# Patient Record
Sex: Female | Born: 1935 | Race: White | Hispanic: No | Smoking: Never smoker
Health system: Southern US, Community
[De-identification: ages and names within clinical notes are randomized; demographics above are authoritative.]

## PROBLEM LIST (undated history)

## (undated) DIAGNOSIS — B029 Zoster without complications: Secondary | ICD-10-CM

## (undated) DIAGNOSIS — I4891 Unspecified atrial fibrillation: Secondary | ICD-10-CM

## (undated) DIAGNOSIS — T7840XA Allergy, unspecified, initial encounter: Secondary | ICD-10-CM

## (undated) DIAGNOSIS — H3530 Unspecified macular degeneration: Secondary | ICD-10-CM

## (undated) DIAGNOSIS — K222 Esophageal obstruction: Secondary | ICD-10-CM

## (undated) DIAGNOSIS — K743 Primary biliary cirrhosis: Secondary | ICD-10-CM

## (undated) DIAGNOSIS — R7989 Other specified abnormal findings of blood chemistry: Secondary | ICD-10-CM

## (undated) DIAGNOSIS — Z86718 Personal history of other venous thrombosis and embolism: Secondary | ICD-10-CM

## (undated) DIAGNOSIS — K219 Gastro-esophageal reflux disease without esophagitis: Secondary | ICD-10-CM

## (undated) DIAGNOSIS — M810 Age-related osteoporosis without current pathological fracture: Secondary | ICD-10-CM

## (undated) DIAGNOSIS — Z7901 Long term (current) use of anticoagulants: Secondary | ICD-10-CM

## (undated) DIAGNOSIS — H269 Unspecified cataract: Secondary | ICD-10-CM

## (undated) DIAGNOSIS — R1310 Dysphagia, unspecified: Secondary | ICD-10-CM

## (undated) DIAGNOSIS — I2510 Atherosclerotic heart disease of native coronary artery without angina pectoris: Secondary | ICD-10-CM

## (undated) DIAGNOSIS — Z8611 Personal history of tuberculosis: Secondary | ICD-10-CM

## (undated) DIAGNOSIS — E785 Hyperlipidemia, unspecified: Secondary | ICD-10-CM

## (undated) DIAGNOSIS — E119 Type 2 diabetes mellitus without complications: Secondary | ICD-10-CM

## (undated) DIAGNOSIS — M1990 Unspecified osteoarthritis, unspecified site: Secondary | ICD-10-CM

## (undated) DIAGNOSIS — N189 Chronic kidney disease, unspecified: Secondary | ICD-10-CM

## (undated) DIAGNOSIS — R945 Abnormal results of liver function studies: Secondary | ICD-10-CM

## (undated) DIAGNOSIS — H409 Unspecified glaucoma: Secondary | ICD-10-CM

## (undated) HISTORY — DX: Esophageal obstruction: K22.2

## (undated) HISTORY — DX: Gastro-esophageal reflux disease without esophagitis: K21.9

## (undated) HISTORY — PX: ABDOMINAL HYSTERECTOMY: SHX81

## (undated) HISTORY — PX: HERNIA REPAIR: SHX51

## (undated) HISTORY — DX: Unspecified atrial fibrillation: I48.91

## (undated) HISTORY — PX: CHOLECYSTECTOMY: SHX55

## (undated) HISTORY — PX: OTHER SURGICAL HISTORY: SHX169

## (undated) HISTORY — DX: Atherosclerotic heart disease of native coronary artery without angina pectoris: I25.10

## (undated) HISTORY — DX: Age-related osteoporosis without current pathological fracture: M81.0

## (undated) HISTORY — DX: Unspecified cataract: H26.9

## (undated) HISTORY — DX: Unspecified macular degeneration: H35.30

## (undated) HISTORY — DX: Dysphagia, unspecified: R13.10

## (undated) HISTORY — DX: Type 2 diabetes mellitus without complications: E11.9

## (undated) HISTORY — PX: MINOR HEMORRHOIDECTOMY: SHX6238

## (undated) HISTORY — DX: Chronic kidney disease, unspecified: N18.9

## (undated) HISTORY — DX: Unspecified osteoarthritis, unspecified site: M19.90

## (undated) HISTORY — DX: Long term (current) use of anticoagulants: Z79.01

## (undated) HISTORY — DX: Abnormal results of liver function studies: R94.5

## (undated) HISTORY — DX: Personal history of other venous thrombosis and embolism: Z86.718

## (undated) HISTORY — DX: Zoster without complications: B02.9

## (undated) HISTORY — DX: Allergy, unspecified, initial encounter: T78.40XA

## (undated) HISTORY — DX: Other specified abnormal findings of blood chemistry: R79.89

## (undated) HISTORY — PX: LIVER BIOPSY: SHX301

## (undated) HISTORY — DX: Primary biliary cirrhosis: K74.3

## (undated) HISTORY — DX: Unspecified glaucoma: H40.9

## (undated) HISTORY — DX: Personal history of tuberculosis: Z86.11

## (undated) HISTORY — DX: Hyperlipidemia, unspecified: E78.5

---

## 1999-07-24 ENCOUNTER — Encounter: Payer: Self-pay | Admitting: Thoracic Surgery

## 1999-07-25 ENCOUNTER — Encounter: Payer: Self-pay | Admitting: Thoracic Surgery

## 1999-07-25 ENCOUNTER — Encounter (INDEPENDENT_AMBULATORY_CARE_PROVIDER_SITE_OTHER): Payer: Self-pay | Admitting: *Deleted

## 1999-07-25 ENCOUNTER — Inpatient Hospital Stay (HOSPITAL_COMMUNITY): Admission: RE | Admit: 1999-07-25 | Discharge: 1999-07-30 | Payer: Self-pay | Admitting: Thoracic Surgery

## 1999-07-26 ENCOUNTER — Encounter: Payer: Self-pay | Admitting: Thoracic Surgery

## 1999-07-27 ENCOUNTER — Encounter: Payer: Self-pay | Admitting: Thoracic Surgery

## 1999-07-28 ENCOUNTER — Encounter: Payer: Self-pay | Admitting: Thoracic Surgery

## 1999-07-29 ENCOUNTER — Encounter: Payer: Self-pay | Admitting: Thoracic Surgery

## 1999-07-30 ENCOUNTER — Encounter: Payer: Self-pay | Admitting: Thoracic Surgery

## 1999-08-12 ENCOUNTER — Encounter: Admission: RE | Admit: 1999-08-12 | Discharge: 1999-08-12 | Payer: Self-pay | Admitting: Thoracic Surgery

## 1999-08-12 ENCOUNTER — Encounter: Payer: Self-pay | Admitting: Thoracic Surgery

## 1999-09-17 ENCOUNTER — Encounter: Payer: Self-pay | Admitting: Thoracic Surgery

## 1999-09-17 ENCOUNTER — Encounter: Admission: RE | Admit: 1999-09-17 | Discharge: 1999-09-17 | Payer: Self-pay | Admitting: Thoracic Surgery

## 2005-12-30 ENCOUNTER — Ambulatory Visit: Payer: Self-pay | Admitting: Cardiology

## 2006-01-13 ENCOUNTER — Ambulatory Visit: Payer: Self-pay | Admitting: Cardiology

## 2006-03-18 ENCOUNTER — Ambulatory Visit: Payer: Self-pay | Admitting: Cardiology

## 2006-03-30 ENCOUNTER — Ambulatory Visit: Payer: Self-pay | Admitting: Cardiology

## 2007-05-23 ENCOUNTER — Ambulatory Visit: Payer: Self-pay | Admitting: Gastroenterology

## 2007-05-23 LAB — CONVERTED CEMR LAB
ALT: 102 units/L — ABNORMAL HIGH (ref 0–35)
Aldolase: 11.8 units/L — ABNORMAL HIGH (ref <=8.1)
Basophils Absolute: 0 10*3/uL (ref 0.0–0.1)
Bilirubin, Direct: 0.1 mg/dL (ref 0.0–0.3)
CO2: 31 meq/L (ref 19–32)
CRP, High Sensitivity: 5 (ref 0.00–5.00)
Calcium: 8.9 mg/dL (ref 8.4–10.5)
Hemoglobin: 13 g/dL (ref 12.0–15.0)
Lymphocytes Relative: 26.9 % (ref 12.0–46.0)
MCHC: 33.6 g/dL (ref 30.0–36.0)
Neutro Abs: 3.9 10*3/uL (ref 1.4–7.7)
Neutrophils Relative %: 63.2 % (ref 43.0–77.0)
Platelets: 183 10*3/uL (ref 150–400)
Prothrombin Time: 22.7 s — ABNORMAL HIGH (ref 10.9–13.3)
RDW: 13 % (ref 11.5–14.6)
Sodium: 141 meq/L (ref 135–145)
Total Bilirubin: 0.7 mg/dL (ref 0.3–1.2)
aPTT: 44 s — ABNORMAL HIGH (ref 21.7–29.8)

## 2007-05-25 ENCOUNTER — Ambulatory Visit: Payer: Self-pay | Admitting: Cardiology

## 2007-06-06 ENCOUNTER — Telehealth: Payer: Self-pay | Admitting: Gastroenterology

## 2007-06-07 ENCOUNTER — Ambulatory Visit (HOSPITAL_COMMUNITY): Admission: RE | Admit: 2007-06-07 | Discharge: 2007-06-07 | Payer: Self-pay | Admitting: Gastroenterology

## 2007-06-07 ENCOUNTER — Telehealth (INDEPENDENT_AMBULATORY_CARE_PROVIDER_SITE_OTHER): Payer: Self-pay | Admitting: *Deleted

## 2007-06-07 ENCOUNTER — Encounter: Payer: Self-pay | Admitting: Gastroenterology

## 2007-06-13 ENCOUNTER — Ambulatory Visit (HOSPITAL_COMMUNITY): Admission: RE | Admit: 2007-06-13 | Discharge: 2007-06-13 | Payer: Self-pay | Admitting: Gastroenterology

## 2007-08-16 ENCOUNTER — Ambulatory Visit: Payer: Self-pay | Admitting: Gastroenterology

## 2007-08-16 DIAGNOSIS — R945 Abnormal results of liver function studies: Secondary | ICD-10-CM | POA: Insufficient documentation

## 2007-08-16 DIAGNOSIS — D68318 Other hemorrhagic disorder due to intrinsic circulating anticoagulants, antibodies, or inhibitors: Secondary | ICD-10-CM | POA: Insufficient documentation

## 2007-08-23 ENCOUNTER — Encounter: Payer: Self-pay | Admitting: Gastroenterology

## 2007-08-23 ENCOUNTER — Inpatient Hospital Stay (HOSPITAL_COMMUNITY): Admission: AD | Admit: 2007-08-23 | Discharge: 2007-08-27 | Payer: Self-pay | Admitting: Gastroenterology

## 2007-08-23 ENCOUNTER — Ambulatory Visit: Payer: Self-pay | Admitting: Internal Medicine

## 2007-09-20 ENCOUNTER — Ambulatory Visit: Payer: Self-pay | Admitting: Gastroenterology

## 2007-09-22 ENCOUNTER — Telehealth: Payer: Self-pay | Admitting: Gastroenterology

## 2007-10-31 ENCOUNTER — Ambulatory Visit: Payer: Self-pay | Admitting: Gastroenterology

## 2007-10-31 LAB — CONVERTED CEMR LAB: Total Bilirubin: 0.7 mg/dL (ref 0.3–1.2)

## 2007-11-01 ENCOUNTER — Telehealth: Payer: Self-pay | Admitting: Gastroenterology

## 2007-11-02 ENCOUNTER — Ambulatory Visit: Payer: Self-pay | Admitting: Gastroenterology

## 2007-12-12 ENCOUNTER — Ambulatory Visit: Payer: Self-pay | Admitting: Gastroenterology

## 2007-12-12 LAB — CONVERTED CEMR LAB
ALT: 17 units/L (ref 0–35)
AST: 26 units/L (ref 0–37)
Alkaline Phosphatase: 102 units/L (ref 39–117)
Bilirubin, Direct: 0.1 mg/dL (ref 0.0–0.3)
Total Bilirubin: 0.7 mg/dL (ref 0.3–1.2)

## 2008-01-09 ENCOUNTER — Ambulatory Visit: Payer: Self-pay | Admitting: Gastroenterology

## 2008-01-09 DIAGNOSIS — K745 Biliary cirrhosis, unspecified: Secondary | ICD-10-CM

## 2008-06-12 ENCOUNTER — Encounter (INDEPENDENT_AMBULATORY_CARE_PROVIDER_SITE_OTHER): Payer: Self-pay | Admitting: *Deleted

## 2008-06-25 ENCOUNTER — Telehealth: Payer: Self-pay | Admitting: Gastroenterology

## 2008-06-29 ENCOUNTER — Ambulatory Visit: Payer: Self-pay | Admitting: Gastroenterology

## 2008-06-29 LAB — CONVERTED CEMR LAB
ALT: 14 units/L (ref 0–35)
Total Protein: 7.1 g/dL (ref 6.0–8.3)

## 2009-05-29 ENCOUNTER — Ambulatory Visit: Payer: Self-pay | Admitting: Gastroenterology

## 2009-05-29 LAB — CONVERTED CEMR LAB
Bilirubin, Direct: 0.1 mg/dL (ref 0.0–0.3)
Total Bilirubin: 0.5 mg/dL (ref 0.3–1.2)

## 2010-03-11 NOTE — Assessment & Plan Note (Signed)
Summary: yearly follow up appt/lk   History of Present Illness Visit Type: Follow-up Visit Primary GI MD: Melvia Heaps MD [facility] Primary Provider: Dorothyann Peng. Bevelyn Buckles, NP Requesting Provider: n/a Chief Complaint: Yearly f/u for abnormal liver function test. Pt denies any GI complaints  History of Present Illness:   Stacey Brown has returned for followup of her primary biliary cirrhosis.  She is antimitochondrial antibody positive.  She takes Delma Freeze but ran out of her medications.  She denies abdominal pain, pruritus or jaundice.   GI Review of Systems      Denies abdominal pain, acid reflux, belching, bloating, chest pain, dysphagia with liquids, dysphagia with solids, heartburn, loss of appetite, nausea, vomiting, vomiting blood, weight loss, and  weight gain.        Denies anal fissure, black tarry stools, change in bowel habit, constipation, diarrhea, diverticulosis, fecal incontinence, heme positive stool, hemorrhoids, irritable bowel syndrome, jaundice, light color stool, liver problems, rectal bleeding, and  rectal pain.    Current Medications (verified): 1)  Warfarin Sodium 3 Mg  Tabs (Warfarin Sodium) .... Take As Directed. 2)  Glimepiride 2 Mg  Tabs (Glimepiride) .... Take 1 Tablet By Mouth Once A Day 3)  Fexofenadine Hcl 180 Mg  Tabs (Fexofenadine Hcl) .... Take 1 Tablet By Mouth Once A Day 4)  Amlodipine Besylate 10 Mg  Tabs (Amlodipine Besylate) .... Take 1 Tablet By Mouth Once A Day 5)  Furosemide 20 Mg  Tabs (Furosemide) .... Take 1 Tablet By Mouth Once A Day 6)  Pantoprazole Sodium 40 Mg Tbec (Pantoprazole Sodium) .Marland Kitchen.. 1 By Mouth Once Daily  Allergies (verified): No Known Drug Allergies  Past History:  Past Medical History: Reviewed history from 08/16/2007 and no changes required. abnormal liver function test Arthritis Atrial fibrillation.   Past Surgical History: Reviewed history from 11/02/2007 and no changes required. Hysterectomy Hemorrhoidectomy Left Breast  biopsy Cholecystectomy Herniography Right Lung biopsy Right hip replacement liver biopsy  Family History: Family History of Breast Cancer: Neice Family History of Liver Disease/Cirrhosis: Nephew Family History of Heart Disease: Brother, Mother Family History of Diabetes: Brother, Mother, Orlie Dakin, Aunts, Uncles, cousins Family History of Kidney Disease: Cousin No FH of Colon Cancer:  Social History: Reviewed history from 09/20/2007 and no changes required. Occupation: Retired/Disabled Patient has never smoked.  Alcohol Use - no Illicit Drug Use - no Patient gets regular exercise. Daily Caffeine Use  Review of Systems       The patient complains of arthritis/joint pain.  The patient denies allergy/sinus, anemia, anxiety-new, back pain, blood in urine, breast changes/lumps, change in vision, confusion, cough, coughing up blood, depression-new, fainting, fatigue, fever, headaches-new, hearing problems, heart murmur, heart rhythm changes, itching, menstrual pain, muscle pains/cramps, night sweats, nosebleeds, pregnancy symptoms, shortness of breath, skin rash, sleeping problems, sore throat, swelling of feet/legs, swollen lymph glands, thirst - excessive , urination - excessive , urination changes/pain, urine leakage, vision changes, and voice change.         All other systems were reviewed and were negative   Vital Signs:  Patient profile:   77 year old female Height:      64 inches Weight:      119 pounds BSA:     1.57 Pulse rate:   64 / minute Pulse rhythm:   regular BP sitting:   118 / 60  (left arm) Cuff size:   regular  Vitals Entered By: Ok Anis CMA (May 29, 2009 10:27 AM)  Physical Exam  Additional Exam:  On  physical exam she is an elderly female  skin: anicteric HEENT: normocephalic; PEERLA; no nasal or pharyngeal abnormalities neck: supple nodes: no cervical lymphadenopathy chest: clear to ausculatation and percussion heart: no murmurs, gallops, or  rubs abd: soft, nontender; BS normoactive; no abdominal masses, tenderness, organomegaly rectal: deferred ext: no cynanosis, clubbing, edema skeletal: no deformities neuro: oriented x 3; no focal abnormalities    Impression & Recommendations:  Problem # 1:  PBC (ICD-571.6) Assessment Unchanged Plan to resume her urso  Problem # 2:  COAGULOPATHY, COUMADIN-INDUCED (ICD-286.5) Assessment: Comment Only  Other Orders: TLB-Hepatic/Liver Function Pnl (80076-HEPATIC)  Patient Instructions: 1)  Copy sent to : Kingsley Spittle 2)  You will go to the basement today for labs 3)  The medication list was reviewed and reconciled.  All changed / newly prescribed medications were explained.  A complete medication list was provided to the patient / caregiver. Prescriptions: URSODIOL 300 MG CAPS (URSODIOL) take one tab b.i.d.  #60 x 12   Entered and Authorized by:   Louis Meckel MD   Signed by:   Louis Meckel MD on 05/29/2009   Method used:   Electronically to        [facility] [phone]* (retail)       [address] [place], [place]  [address]       Ph: [phone] or [phone]       Fax: [phone]   RxID:   [id number]

## 2010-05-30 ENCOUNTER — Other Ambulatory Visit: Payer: Self-pay | Admitting: Gastroenterology

## 2010-05-30 NOTE — Telephone Encounter (Signed)
PT NEEDS TO CONTACT THE OFFICE TO MAKE HER A YEARLY OFFICE APPOINTMENT.Marland KitchenMarland Kitchen

## 2010-06-24 NOTE — Discharge Summary (Signed)
NAMEDIESHA, Stacey Brown                 ACCOUNT NO.:  [id number]   MEDICAL RECORD NO.:  [id number]          PATIENT TYPE:  INP   LOCATION:  1302                         FACILITY:  [facility]   PHYSICIAN:  Rachael Fee, MD   DATE OF BIRTH:  [birth year]   DATE OF ADMISSION:  08/23/2007  DATE OF DISCHARGE:  08/27/2007                               DISCHARGE SUMMARY   ADMITTING DIAGNOSES:  1. An 77 year old white female with pneumothorax sustained post liver      biopsy (pneumothorax of approximately 10%).  2. Abnormal liver function tests, chronic, rule out PBC.  3. Adult-onset diabetes mellitus.  4. Chronic Coumadin use secondary to history of atrial fibrillation.      The patient's Coumadin had been held pre liver biopsy.  5. Question of deep venous thrombosis, 2008.  6. Previous right hip fracture status post open reduction internal      fixation.  7. Remote history of tuberculosis.  8. Status post cholecystectomy, ventral umbilical hernia repair, left      breast biopsy hemorrhoidectomy.   DISCHARGE DIAGNOSIS:  Stable status post iatrogenic hydropneumothorax  occurring at time of liver biopsy. Clinically stable with persistent  small right apical pneumothorax.   BRIEF HISTORY:  Stacey Brown is an 77 year old white female known to Dr. Arlyce Dice  who has been followed for abnormal LFTs dating back several months.  She  was scheduled for a liver biopsy on the day of admission and this was  done per Dr. Arlyce Dice with ultrasound guidance. She did have successful  liver biopsy; however, she also developed transient hypoxia and a small  amount of hemoptysis, and subsequent chest x-ray showed a 5% to 12%  right apical pneumothorax.  Followup chest x-ray approximately  2 hours  later showed no change in the right apical pneumothorax, but she had a  new basilar component on the right of about 2.7 cm.  She was complaining  of discomfort in the right upper quadrant. O2 sats remained 95% on 2  liters, and she  was admitted for observation and medical management of  the pneumothorax.   CONSULTATIONS:  Pulmonary, Dr. Sherene Sires.   LABORATORY DATA:  On August 24, 2007 WBC of 6.4, hemoglobin 11.8,  hematocrit of 34.8, MCV of 90.8, platelets 185. On 07/16 WBC of 6.3,  hemoglobin 11, hematocrit of 32, platelets 146. On admission Protime  14.8, INR of 1.1, PTT of 32. On 07/17 Protime was 13.5, INR of 1.   X-ray studies: On admission, July 14, showed a right-sided pneumothorax,  apical 5% to 10%, and a new basilar component measuring 2.7 cm. Air  space consolidation in the right space as well.  Followup on July 15,  hydro pneumothorax in the right lung. Followup on July 16,  hydropneumothorax of the right lung, density in the right base  consistent with atelectasis and infiltrate unchanged, and on July 18  apical pneumothorax measuring 1.8 cm, unchanged bilateral pleural  effusions, no air space densities   HOSPITAL COURSE:  The patient had undergone outpatient ultrasound-guided  liver biopsy per Dr. Arlyce Dice and sustained a pneumothorax at  the time of  the procedure.  She remained clinically stable, did have a transient  hypoxia and a small amount of hemoptysis.  Chest x-ray was obtained  which did confirm a small right apical pneumothorax, and the patient was  admitted for medical management.  She was continued on O2.  Had a benign  hospital course. She did have some evidence of hydropneumothorax  consistent with bleed. She was seen in consultation by Dr. Sherene Sires for  pulmonary and felt that she should remain off of all anticoagulants and  to have followup chest x-ray in 48 hours, and it was felt that the  hydropneumothorax was very small, instead it should resolve without any  intervention.  The patient remained relatively asymptomatic. She was  allowed discharge to home on July 18 in a stable and improved condition  and was advised to hold her Coumadin until July 25 at which time this  would be restarted.  She was to continue her amlodipine, fexofenadine,  furosemide, glimepiride, and ranitidine as previous, and to follow up  with Dr. Arlyce Dice on August 11 or sooner if needed, and to call for  problems in the interim.  Path from the liver biopsy showed benign lung  tissue.      Mike Gip, PA-C      Rachael Fee, MD  Electronically Signed    AE/MEDQ  D:  09/15/2007  T:  09/15/2007  Job:  [id number]

## 2010-06-24 NOTE — Assessment & Plan Note (Signed)
[facility]                         GASTROENTEROLOGY OFFICE NOTE   Stacey Brown, Stacey Brown                        MRN:          [id number]  DATE:05/23/2007                            DOB:          1923-02-04    REASON FOR CONSULTATION:  Abnormal liver test.   HISTORY OF PRESENT ILLNESS:  Stacey Brown is an 77 year old white female  referred through the courtesy of Dr. Lorin Picket for evaluation.  Routine lab  tests have demonstrated abnormal liver tests.  On March 31, 2007, alk  phos was 231, LDH 296, AST 83, and ALT was 187.  Bilirubin was normal.  Repeat tests on April 14, 2007, were pertinent for alk phos of 234, AST  199, and ALT of 150.  Her aldolase was elevated to 20.1.  Hepatitis A,  B, and C antibodies were negative.  Stacey Brown has no specific GI  complaints except for mild fatigue.  Specifically, Stacey Brown is without  pruritus, pain, or weight loss.  Stacey Brown does have complaint of dysphagia to  solids and has occasional pyrosis.  An abdominal ultrasound demonstrated  a bile duct measuring 7-to-8-mm.  Stacey Brown is status post cholecystectomy.  Stacey Brown does not drink.  Stacey Brown has had no prior liver disease or jaundice.   PAST MEDICAL HISTORY:  1. Diabetes.  2. Hypertension.  3. Atrial fibrillation for which Stacey Brown is on Coumadin.  4. Arthritis.  5. DVT approximately a year ago.  6. Status post cholecystectomy.  7. Herniorrhaphy.  8. Hemorrhoidectomy.  9. Hysterectomy.  10.Lung biopsy.  11.Right hip replacement.   FAMILY HISTORY:  Pertinent for mother and brother with diabetes, and  brother mother with heart disease.   MEDICATIONS:  Glimepiride, fexofenadine, amlodipine, bisacodyl,  Coumadin, ranitidine, and furosemide.   ALLERGIES:  Stacey Brown has no allergies.   SOCIAL HISTORY:  Stacey Brown neither smokes nor drinks.  Stacey Brown is widowed and  retired.   REVIEW OF SYSTEMS:  Positive for feet swelling and joint pains,  otherwise negative.   PHYSICAL EXAMINATION:  GENERAL:  Stacey Brown is  an elderly female in no acute  distress.  VITAL SIGNS:  Pulse 76, blood pressure 140/70, weight 123.  HEENT: EOMI.  PERRLA.  Sclerae are anicteric.  Conjunctivae are pink.  NECK:  Supple without thyromegaly, adenopathy or carotid bruits.  CHEST:  Clear to auscultation and percussion without adventitious  sounds.  CARDIAC:  Regular rhythm; normal S1 S2.  There are no murmurs, gallops  or rubs.  ABDOMEN:  Bowel sounds are normoactive.  Abdomen is soft, nontender and  nondistended.  There are no abdominal masses, tenderness, splenic  enlargement or hepatomegaly.  EXTREMITIES:  Stacey Brown has 1 to 2 plus brawny edema of the ankles and feet.  SKELETAL:  Remarkable for no deformities.  NEUROLOGIC:  Stacey Brown is alert, oriented, and without focal abnormalities.  RECTAL:  Deferred.   IMPRESSION:  1. Abnormal liver function tests.  Stacey Brown has both a necroinflammatory      and cholestatic pattern.  I am concerned that Stacey Brown may have an      occult neoplasm causing biliary tract obstruction.  Her elevated  aldolase suggests that Stacey Brown may have an underlying myositis, though      this is not clinically evident.  Drug-induced hepatitis is unlikely      in the absence of new medications.  There is no evidence for viral      hepatitis.  2. Dysphagia, rule out esophageal stricture.  3. Atrial fibrillation on Coumadin.   RECOMMENDATION:  1. CT of the abdomen.  2. Repeat LFTs and check an aldolase and check AMA and ANA.  3. Consider upper endoscopy pending results of the above once her LFT      abnormalities are resolved.     Barbette Hair. Arlyce Dice, MD,FACG  Electronically Signed    RDK/MedQ  DD: 05/23/2007  DT: 05/23/2007  Job #: [id number]   cc:   Dr. Lorin Picket
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
## 2010-06-27 NOTE — Assessment & Plan Note (Signed)
[facility]                            [facility] OFF[facility] NOTE   Stacey Brown, Stacey Brown                        MRN:          [id number]  DATE:12/30/2005                            DOB:          06-01-1922    REFERRING PHYSICIAN:  Ernestina Penna, M.D.   HISTORY OF PRESENT ILLNESS:  The patient is an 77 year old female with  history of dyspnea, hypertension, diabetes mellitus.  The patient was last  seen in this office on December 29, 2002.  She was diagnosed with  hypertensive heart disease and aortic root dilatation.  The patient's aorta  measured 3.5 cm.  She has done well since that time.  It appears that her  blood pressures have been under reasonable control.  The patient has been  referred due to new onset of atrial fibrillation.   The patient states that she has occasional palpitations that are fairly  infrequent and only last a few seconds.  There is associated shortness of  breath, but no chest pain, orthopnea or PND.  She has noticed mild ankle  swelling.  Of note is that she had a Cardiolite stress study done 4 years  ago which was negative for ischemia.  Medications were switched from Norvasc  to Cardizem today in the office.  Her rate appears to be well controlled.  EKG demonstrates atrial flutter in the office today, but the EKG reviewed  from [facility] actually shows atrial fibrillation.   Of note is that the patient states that she is under the care of the eye  doctors in [place].  She apparently has a retinal bleed and has been  scheduled for surgery.  She also is planned to undergo cataract surgery.   MEDICATIONS:  1. Claritin.  2. Monopril 10 mg a day.  3. Amaryl.  4. Norvasc was discontinued.  5. Zantac.  6. Tylenol.  7. Aspirin 81 mg a day.  8. Cardizem LA 240 mg p.o. nightly.  9. Glimepiride 2 mg p.o. daily.   REVIEW OF SYMPTOMS:  Per HPI.  No melena or hematochezia.  No dysuria or  frequency.   No orthopnea or PND.  No syncope.  Decreased vision in the left  eye.  No other neurological symptoms.   PAST MEDICAL HISTORY:  See problem list below.   SOCIAL HISTORY:  Was reviewed.  The patient lives in [place].  She does not  smoke.   FAMILY HISTORY:  Noncontributory.   PHYSICAL EXAMINATION:  VITAL SIGNS:  Her blood pressure is 138/70.  Heart  rate is 72 beats per minute.  Weight is 130 pounds.  NECK:  Reveals normal carotid upstrokes.  No carotid bruits.  Supple.  HEENT:  Pupils, sclerae and conjunctivae clear.  Oropharynx is clear.  LUNGS:  Clear breath sounds.  HEART:  Irregular rate and rhythm.  Normal S1 and S2.  No pathological  murmurs.  ABDOMEN:  Soft and non-tender.  No rebound or guarding.  Good bowel sounds.  EXTREMITIES:  No cyanosis, clubbing or edema.   ELECTROCARDIOGRAM:  Atrial flutter with rate control.  Heart  rate 71 beats  per minute.   PROBLEM LIST:  1. Hypertensive heart disease.  2. New onset atrial flutter and atrial fibrillation, likely secondary to      number one.  3. History of mild aortic root dilatation.  4. History of hypertensive heart disease.  5. Adult onset diabetes mellitus.  6. Dyslipidemia.  7. Hypertension.   PLAN:  1. The patient has indications to start Coumadin given her age and risk      factors including hypertension, diabetes mellitus.  However, this      appears to be contraindicated given her current history of retinal      bleed and plan for surgery.  2. I have told the patient to have her eye taken care of first, and then      we will see her back in 3 months to reconsider the use of Coumadin.  3. Aspirin can be continued in the meanwhile.  4. The patient will be scheduled for CardioNet monitor, which will be      mailed to her home to assess rate control of atrial fibrillation.  At      this point we are choosing for a rate control strategy versus rhythm      control strategy.  5. The patient will need during the next  clinic visit, an      echocardiographic study repeated to evaluate aortic root size.     Learta Codding, MD,FACC  Electronically Signed    GED/MedQ  DD: 12/30/2005  DT: 12/30/2005  Job #: [id number]   cc:   Ernestina Penna, M.D.

## 2010-06-27 NOTE — Op Note (Signed)
[place]. [facility]  Patient:    Stacey Brown, Stacey Brown                        MRN: [id number] Adm. Date:  [id number] Disc. Date: [id number] Attending:  Cameron Proud                           Operative Report  PREOPERATIVE DIAGNOSIS:  Right middle lobe and right lower lobe reticular nodule infiltrates.  POSTOPERATIVE DIAGNOSIS:  Granulomatous process right middle lobe and right lower lobe.  OPERATION PERFORMED:  Right video-assisted thorascopic lung biopsies x 2, right middle lobe and right lower lobe.  SURGEON:  D. Karle Plumber, M.D.  FIRST ASSISTANT:  Claudette _____ .  ANESTHESIA:  General anesthesia.  DESCRIPTION OF PROCEDURE:   After percutaneous insertion of all monitor lines, the patient underwent general anesthesia and returned to the right lateral thoracotomy position, was prepped and draped in the usual sterile manner.  Two trocar sites were made in the anterior and posterior axillary line at the 7th intercostal space.  The lungs was deflated.  Two trocars were inserted.  The lesion was seen with adhesions at the middle lobe in the lateral segment to the wall, as well as, there were other areas of atelectasis, as well, and small lesions in the posterior basal segment of the right lower lobe.  A third incision was made, a 3-cm was made at the fifth intercostal space at the midaxillary line, and then using an Auto Suture 60 stapler, the right middle lobe lesion was excised with several applications of the stapler, Auto Suture 60 and 45 stapler.  The right upper lobe was removed with large and small lesions and these were sent for frozen section, which grew the granulomatous process.  Then dissection was carried out at the right lower lobe, grabbing the posterior basal segment, bringing it up and stapling it three times, and resecting it with three applications of the Auto Suture stapler.  It was sent for frozen section and culture.  The  middle incision was closed with 1 pericostal 1-0 Vicryl, and the muscle layer with skin clips.  Two chest tubes were placed through the tread of the trocar sites and tied in place with 0 silk.  Dry and sterile dressing were applied and Marcaine block was done, intercostal nerve block, and the patient was returned to recovery room in stable condition.      DD:  07/25/99 TD:  07/29/99 Job: [id number]

## 2010-06-27 NOTE — Assessment & Plan Note (Signed)
[facility]                          [facility] NOTE   Stacey Brown, Stacey Brown                        MRN:          [id number]  DATE:03/18/2006                            DOB:          1922-11-07    HISTORY OF PRESENT ILLNESS:  The patient is an 77 year old female with a  history of hypertension, diabetes mellitus.  The patient has also a  diagnosis of hypertensive heart disease and aortic root dilatation.  She  was found to have new onset atrial flutter/atrial fibrillation on her  last office visit.  A CardioNet monitor was applied to assess the  patient's rate control.  The patient's rate has been well controlled  with rates between 50 to 100 beats per minute.  She has not been started  on Coumadin secondary to the fact that the patient has a history of  retinal bleed and more recently was undergoing eye surgery.   The patient states that she has been doing well.  She reports no  palpitations or syncope.  She has had no shortness of breath.   Blood pressure appears to be under better control.   MEDICATIONS:  1. Amaryl 2 mg a day.  2. Zantac 150 mg a day.  3. Crestor 5 mg a day.  4. Cardizem LA 240 mg p.o. nightly.  5. Nitrofurantoin 60 mg p.o. nightly.  6. Docusate 100 mg p.o. q. a.m.  7. Lisinopril 10 mg a day.  8. Lasix 20 mg p.o. daily.   PHYSICAL EXAMINATION:  VITAL SIGNS:  Blood pressure 156/68, heart rate  is 72 beats per minute.  Weight 129 pounds.  NECK:  Normal carotid upstroke.  No carotid bruits.  LUNGS:  Clear bilaterally.  HEART:  Irregular rate and rhythm with normal S1, S2.  No murmurs, rubs,  or gallops.  ABDOMEN:  Soft and nontender with no rebound or guarding.  Good bowel  sounds.  EXTREMITIES:  No cyanosis, clubbing, or edema.   PROBLEMS:  1. Hypertensive heart disease.  2. Atrial fibrillation, likely secondary to #1.  3. History of mild aortic root dilatation.  4. Adult onset diabetes mellitus.  5.  Dyslipidemia.  6. Hypertension.   PLAN:  1. We will defer Coumadin at the present time as the patient is still      getting injections in the eye.  I have asked her ophthalmologist to      give me a call during the next visit to see what the long term      plans are regarding her eye surgeries.  2. Anemia.  We will have the patient continue on aspirin.  3. I have ordered an echographic study to reassess the patient's left      ventricular function, but particularly aortic root size given the      history of mild aortic dilatation and hypertensive heart disease.  4. The patient will follow up with [place] in 6 months.     Learta Codding, MD,FACC  Electronically Signed    GED/MedQ  DD: 03/18/2006  DT: 03/18/2006  Job #: [id number]   cc:  Stacey Brown, M.D.

## 2010-06-27 NOTE — Discharge Summary (Signed)
[place]. [facility]  Patient:    Stacey Brown, Stacey Brown                          MRN: [id number] Adm. Date:  07/25/99 Disc. Date: 07/30/99 Attending:  D. Karle Plumber, M.D. Dictator:   Loura Pardon, P.A. CC:         Charlesetta Shanks, M.D.             Barbaraann Share, M.D. [facility]             D. Karle Plumber, M.D.                           Discharge Summary  DATE OF BIRTH:  [birth year]  PRIMARY CAREGIVER:  Charlesetta Shanks, M.D., [place], [place], also Barbaraann Share, M.D. pulmonologist.  FINAL DIAGNOSIS:  Nodular density in the right middle lobe with a finding on biopsy of granulomatous inflammation with focal necrosis (negative for malignancy).  SECONDARY DIAGNOSES: 1. Type 2 diabetes diagnosed three years ago. 2. Gastroesophageal reflux disease (strangling feeling with swallow). 3. Hypertension.  PROCEDURES: 1. July 25, 1999, wedge biopsy of the right middle lobe x 2, D. Karle Plumber, M.D.  Patient tolerated the surgery well and was transferred in    stable and satisfactory condition to the recovery room. 2. July 29, 1999, swallow evaluation to investigate strangling feeling during    swallow.  This has not been performed at the time of this discharge    summary.  DISCHARGE DISPOSITION:  Stacey Brown is judged a suitable candidate for discharge on postoperative day #5, June 20.  Stacey mental status has been clear in the postoperative period.  She has experienced no respiratory distress. She was taken off all supplemental oxygen by postoperative day #2.  She has experienced no cardiac dysrhythmias.  She has had constipation postoperatively that is now resolved.  Stacey appetite is returning.  She is ambulating but she needs a walker to help.  She feels unsteady.  She does have both Stacey daughter and daughter-in-law at home to help when she goes home.  Stacey incision is healing nicely.  There is no evidence of erythema or drainage.  Stacey pain  is controlled well with oral analgesia.  At the time of surgery, microbiological studies were all negative, with negative acid fast bacteria.  Gram stain was negative for organisms.  There were no yeast or fungi discovered.  A PPD was placed and that was negative after 48 hours.  She also had a swallow evaluation on postoperative day #4.  The results of the study are pending. She also had an aspergillus titer as well as an IgE titer also pending.  DISCHARGE MEDICATIONS: 1. Percocet 5/325 one to two tablets p.o. q.4-6h. p.r.n. pain. 2. Nexium 40 mg daily. 3. Amaryl 2 mg daily. 4. Claritin daily as needed. 5. Norvasc 5 mg daily. 6. Mylanta one tablespoon as needed for reflux symptoms.  ACTIVITY:  Ambulation as tolerated.  She is asked not to drive for two weeks.  DISCHARGE DIET:  Low-sodium, low-cholesterol, ADA diet.  WOUND CARE:  She may bathe daily, keeping Stacey incisions clean and dry.  FOLLOW-UP:  She will have follow-up with Dr. Edwyna Shell, Tuesday, August 12, 1999, at 3:40 in the afternoon.  One hour prior to this visit, she is to get chest x-ray at [facility] and bring it to  the appointment with Dr. Edwyna Shell.  HISTORY OF PRESENT ILLNESS:  Stacey Brown is a 77 year old female referred by Dr. Charlesetta Shanks for right lung infiltrate/nodule.  She had a workup for cholecystectomy which was performed uneventfully in May of this year.  At that time, radiographic studies, chest x-ray was taken at the time of workup for Stacey gallbladder surgery which demonstrated a new nodularity in the right lung anteriorly.  On computer tomogram, radiology could not exclude a new mass or nodule developing.  At the time, it could not be further characterized by computer tomogram.  The radiologist recommended thoracic surgery consultation and biopsy.  Stacey Brown was referred to Dr. Algis Downs. Karle Plumber.  At the time of referral, she has had mild increase in shortness of breath but tolerated  the cholecystectomy without problems.  Stacey Brown does not smoke or drink and has no history of exposure to toxic fumes or dusts.  She was scheduled for a right video-assisted thoracoscopy with wedge resection biopsy of right middle lobe nodule on June 15.  HOSPITAL COURSE:  Stacey Brown was admitted to [facility] on June 15.  She underwent right video-assisted thoracoscopy, with wedge resection of the right middle lobe x 2.  Pathology studies have been dictated above demonstrating granulomatous inflammation with focal necrosis negative for malignancy.  She tolerated the procedure well and was transferred in stable and satisfactory condition to the recovery room.  On postoperative day #1, the anterior chest tube was removed.  Chest tube did not have an air leak. A PPD was placed and the study was negative.  Several intraoperative cultures were taken and they are negative, negative for acid fast bacteria.  Gram stain was negative.  Yeast and fungi were negative.  On postoperative day #2, hemoglobin was 9.8, hematocrit 29.2%.  The patient was achieving satisfactory oxygen saturations on room air.  Creatinine was 0.9, potassium 4.0, and the posterior chest tube was discontinued.  On postoperative day #3, the patient was achieving 94% oxygen saturation on room air.  Incision was healing nicely. She was ambulating with the help of a walker.  Pulmonary consult was obtained. They suggested a swallow evaluation in light of the fact that the patient has been losing weight recently and has a strangulating feeling on swallowing. They also ordered a IgE titer as well as aspergillus titer.  These tests are all pending at the time of discharge.  On postoperative day #4, Stacey Brown was stable using the rolling walker to ambulate.  She had been afebrile in the postoperative period.  Mental status was clear.  Stacey pain is controlled with oral analgesia and Stacey incision is healing  nicely.  She is still constipated  despite stool softener and a laxative of choice.  Stacey bowel management will be addressed on postoperative day #4 in preparation for Stacey to be discharged postoperative day #5, June 20. DD:  07/29/99 TD:  07/30/99 Job: [id number]

## 2010-11-06 LAB — APTT: aPTT: 32

## 2010-11-06 LAB — PROTIME-INR: INR: 1.1

## 2010-11-07 LAB — CBC
MCHC: 33.9
MCV: 90.7
MCV: 90.8
Platelets: 146 — ABNORMAL LOW
Platelets: 185
RBC: 3.57 — ABNORMAL LOW
WBC: 6.4

## 2010-11-07 LAB — GLUCOSE, CAPILLARY
Glucose-Capillary: 120 — ABNORMAL HIGH
Glucose-Capillary: 140 — ABNORMAL HIGH
Glucose-Capillary: 164 — ABNORMAL HIGH
Glucose-Capillary: 97
Glucose-Capillary: 99

## 2010-11-07 LAB — PROTIME-INR
INR: 1
INR: 1.1
INR: 1.1
Prothrombin Time: 13.5
Prothrombin Time: 14.2

## 2011-06-29 ENCOUNTER — Other Ambulatory Visit: Payer: Self-pay | Admitting: Gastroenterology

## 2012-02-25 ENCOUNTER — Telehealth: Payer: Self-pay | Admitting: Gastroenterology

## 2012-02-25 NOTE — Telephone Encounter (Signed)
Per Dr. Kathi Der office pt is having increasing problems with dysphagia. He is requesting her be seen right away. Pt scheduled to see Mike Gip PA tomorrow at 1:30pm. Debbie to notify pt of appt date and time.

## 2012-02-26 ENCOUNTER — Ambulatory Visit: Payer: Self-pay | Admitting: Physician Assistant

## 2012-02-29 ENCOUNTER — Ambulatory Visit: Payer: Self-pay | Admitting: Physician Assistant

## 2012-03-08 ENCOUNTER — Ambulatory Visit: Payer: Self-pay | Admitting: Physician Assistant

## 2012-03-14 ENCOUNTER — Encounter: Payer: Self-pay | Admitting: *Deleted

## 2012-03-15 ENCOUNTER — Encounter: Payer: Self-pay | Admitting: Physician Assistant

## 2012-03-15 ENCOUNTER — Ambulatory Visit (INDEPENDENT_AMBULATORY_CARE_PROVIDER_SITE_OTHER): Payer: PRIVATE HEALTH INSURANCE | Admitting: Physician Assistant

## 2012-03-15 VITALS — BP 120/70 | HR 60 | Ht 64.0 in | Wt 112.4 lb

## 2012-03-15 DIAGNOSIS — I4891 Unspecified atrial fibrillation: Secondary | ICD-10-CM

## 2012-03-15 DIAGNOSIS — R1310 Dysphagia, unspecified: Secondary | ICD-10-CM

## 2012-03-15 NOTE — Patient Instructions (Addendum)
We scheduled the Barium Swallow for Friday 03-18-2012. Arrive to [facility], 1st floor at 10:45Am. She can eat breakfast.   Stay on the Prilosec. Avoid meats for now. Drink Ensure or Boost twice daily between meals.

## 2012-03-15 NOTE — Progress Notes (Signed)
Subjective:    Patient ID: Stacey Brown, female    DOB: 1922-04-14, 79 y.o.   MRN: [id number]  HPI  Stacey Brown is a very nice 79 year old white female known to Dr. Arlyce Dice who has not been seen in the office over the past 4 years. She does have a diagnosis of primary biliary cirrhosis, atrial fibrillation for which she's been maintained on Coumadin, hypertension and hyperlipidemia. She comes in today with her family with complaints of progressive dysphagia. Her last procedures were done in 2009 and this was an ERCP I do not have copies of any previous endoscopy though her daughter says that Dr. Arlyce Dice had attempted an endoscopy at one time and was unsuccessful due to her esophagus "being too small" she has had problems with solid food dysphagia over the past few years but this has gotten worse over the past several months. She particularly has difficulty with dry foods and meat, states she generally does not have any difficulty with liquids. She was sick earlier in January with a virus and had a lot of of mucus and at that time says she had an increase in dysphagia difficulty and lost some weight but has been doing better over the past couple of weeks. She denies any heartburn or indigestion no odynophagia. She does have intermittent coughing and strangling with her meals as well . No current complaints of abdominal pains, she has mild constipation which she manages with suppositories when necessary.    Review of Systems  Constitutional: Positive for unexpected weight change.  HENT: Positive for trouble swallowing.   Eyes: Negative.   Respiratory: Negative.   Cardiovascular: Negative.   Gastrointestinal: Positive for constipation.  Genitourinary: Negative.   Musculoskeletal: Positive for arthralgias.  Skin: Negative.   Neurological: Negative.   Hematological: Bruises/bleeds easily.  Psychiatric/Behavioral: Negative.    Outpatient Encounter Prescriptions as of 03/15/2012  Medication Sig Dispense  Refill  . amLODipine (NORVASC) 5 MG tablet Take 5 mg by mouth daily.      Marland Kitchen atorvastatin (LIPITOR) 40 MG tablet Take 40 mg by mouth daily.      . carboxymethylcellulose (REFRESH PLUS) 0.5 % SOLN 1 drop 3 (three) times daily as needed.      . furosemide (LASIX) 20 MG tablet Take 20 mg by mouth daily.      Marland Kitchen glimepiride (AMARYL) 2 MG tablet Take 2 mg by mouth daily before breakfast.      . nebivolol (BYSTOLIC) 5 MG tablet Take 5 mg by mouth daily.      Marland Kitchen omeprazole (PRILOSEC) 40 MG capsule Take 40 mg by mouth daily.      . travoprost, benzalkonium, (TRAVATAN) 0.004 % ophthalmic solution Place 1 drop into both eyes at bedtime.      . ursodiol (ACTIGALL) 300 MG capsule TAKE ONE CAPSULE BY MOUTH TWICE DAILY  60 capsule  11  . warfarin (COUMADIN) 3 MG tablet Take 3 mg by mouth daily.       No Known Allergies  Patient Active Problem List  Diagnosis  . COAGULOPATHY, COUMADIN-INDUCED  . PBC  . NONSPECIFIC ABNORMAL RESULTS LIVR FUNCTION STUDY  . Dysphagia  . Atrial fibrillation   History  Substance Use Topics  . Smoking status: Never Smoker   . Smokeless tobacco: Never Used  . Alcohol Use: No   family history includes Breast cancer in her other; Cirrhosis in her other; Diabetes in her mother; Heart disease in her brother and mother; and Kidney disease in her cousin.  There is  no history of Colon cancer.    Objective:   Physical Exam  well-developed frail-appearing thin elderly white female in no acute distress. She ambulates with a cane and is accompanied by 2 family members. Blood pressure 120/70 pulse 60 height 5 foot 4 weight 112. HEENT; nontraumatic normocephalic EOMI PERRLA sclera anicteric, Neck; is supple his no JVD, Cardiovascular; regular rate and rhythm with S1-S2 there's no murmur or gallop, Pulmonary; clear bilaterally, Abdomen; flat soft nontender nondistended no palpable mass or hepatosplenomegaly bowel sounds are active, Rectal ;exam not done, Extremities; thin no edema skin warm  and dry, Psych; mood and affect normal and appropriate.        Assessment & Plan:   #57  79 year old female with history of dysphagia over the past few years progressive now associated with weight loss. Her dysphagia is primarily to solid foods. Will need to rule out esophageal stricture versus underlying motility disorder. #2 chronic anticoagulation with Coumadin #3 history of atrial fibrillation and remote DVT #4 hypertension #5 hyperlipidemia #6 PBC stable   Plan; will schedule for barium swallow first given her age, frailty, anticoagulation et Karie Soda and then depending on results of barium swallow can proceed to  EGD with dilation if indicated. Advised soft diet with avoidance of meat Add Ensure or boost twice daily Continue Prilosec 40 mg by mouth daily

## 2012-03-18 ENCOUNTER — Other Ambulatory Visit: Payer: Self-pay | Admitting: Physician Assistant

## 2012-03-18 ENCOUNTER — Other Ambulatory Visit (HOSPITAL_COMMUNITY): Payer: Self-pay | Admitting: Physician Assistant

## 2012-03-18 ENCOUNTER — Ambulatory Visit (HOSPITAL_COMMUNITY)
Admission: RE | Admit: 2012-03-18 | Discharge: 2012-03-18 | Disposition: A | Discharge status: Home / Self Care | Payer: PRIVATE HEALTH INSURANCE | Source: Ambulatory Visit | Attending: Physician Assistant | Admitting: Physician Assistant

## 2012-03-18 ENCOUNTER — Telehealth: Payer: Self-pay | Admitting: *Deleted

## 2012-03-18 DIAGNOSIS — R1310 Dysphagia, unspecified: Secondary | ICD-10-CM

## 2012-03-18 DIAGNOSIS — K225 Diverticulum of esophagus, acquired: Secondary | ICD-10-CM | POA: Insufficient documentation

## 2012-03-18 NOTE — Telephone Encounter (Signed)
Spoke with patient's daughter and gave her appointment date and time. Reviewed instructions as per Mike Gip, PA.: Pureed diet, sit upright 90 degrees during meals, call for fever, productive sputum.

## 2012-03-18 NOTE — Telephone Encounter (Signed)
Scheduled modified barium swallow at acute rehab on 03/24/12 at 1:00 PM. No prep. Left a message for patient to call me.

## 2012-03-20 NOTE — Progress Notes (Signed)
Reviewed and agree with management. Jahmari Esbenshade D. Suhan Paci, M.D., FACG  

## 2012-03-21 ENCOUNTER — Telehealth: Payer: Self-pay | Admitting: Gastroenterology

## 2012-03-21 NOTE — Telephone Encounter (Signed)
Pt scheduled for modified barium swallow at the hospital later this week. Pt wants to reschedule the procedure due to the possibility of bad weather. Number to reschedule given to the daugher, [phone].

## 2012-03-24 ENCOUNTER — Other Ambulatory Visit (HOSPITAL_COMMUNITY): Payer: PRIVATE HEALTH INSURANCE

## 2012-03-24 ENCOUNTER — Ambulatory Visit (HOSPITAL_COMMUNITY): Payer: PRIVATE HEALTH INSURANCE

## 2012-04-01 ENCOUNTER — Ambulatory Visit (HOSPITAL_COMMUNITY)
Admission: RE | Admit: 2012-04-01 | Discharge: 2012-04-01 | Disposition: A | Discharge status: Home / Self Care | Payer: PRIVATE HEALTH INSURANCE | Source: Ambulatory Visit | Attending: Physician Assistant | Admitting: Physician Assistant

## 2012-04-01 DIAGNOSIS — R1310 Dysphagia, unspecified: Secondary | ICD-10-CM | POA: Insufficient documentation

## 2012-04-01 NOTE — Procedures (Signed)
Objective Swallowing Evaluation: Modified Barium Swallowing Study  Patient Details  Name: Stacey Brown MRN: [id number] Date of Birth: 11-23-1922  Today's Date: 04/01/2012 Time: 4098-1191 SLP Time Calculation (min): 71 min  Past Medical History:  Past Medical History  Diagnosis Date  . Abnormal liver function test   . Arthritis   . Atrial fibrillation   . Chronic anticoagulation   . History of DVT of lower extremity    Past Surgical History:  Past Surgical History  Procedure Laterality Date  . Abdominal hysterectomy    . Minor hemorrhoidectomy    . Left breast biopsy    . Cholecystectomy    . Herniography    . Right lung biopsy    . Right hip replacement    . Liver biopsy     HPI:  79 yo female referred by  GI for MBS due to pt aspirating on esophagram Mar 18, 2012.  PMH + for dysphagia, Afib and remote DVT, HLD, HTN.  Pt denies significant weight loss but does admit to recurrent colds. Medication list includes Norvasc, Lipitor, Lasix, Amaryl, Bystolic, Prilosec, Coumadin, Actigall, Travatan.  Pt previously had undergone a bronch with removal of lettuce from her lung when she was in her 70's per her daughter's statement.  She denies ever requiring heimlich maneuver.      Dysphagia most notably worsened since last summer when pt "choked" on robitussin and it caused a "burn", per her statement.  She admits to decreased overall body strength since last summer as well.  Pt describes sensation of food sticking in throat requiring her to hock it up to swallow again.  Description depicts more problems with food "sticking" and water causing her to "strangle/choke".  Pt also admits to liquids coming up in esophagus and frequent belching.  Large pills will often stick in throat requiring pt to swallow food and carbonated beverages to push it down per pt.  In addition, pt pointed to distal esophagus stating sometimes she feels like "it won't go down."    Pt eats alone at times per her  statement.      Assessment / Plan / Recommendation Clinical Impression  Dysphagia Diagnosis: Moderate pharyngeal phase dysphagia;Moderate cervical esophageal phase dysphagia;Suspected primary esophageal dysphagia  Clinical impression: Pt presents with moderate pharyngeal and cervical esophageal dysphagia as well as suspected primary esophageal issues.    Pt did not aspirate any consistency tested, but did demonstrate laryngeal penetration of thin and nectar liquids due to decr laryngeal elevation/closure and decreased UES clearance.    Chin tuck posture prevented laryngeal penetration and reflexive throat clearing removed mild amount of penetration.  Epiglottic deflection is compromised and pt's epiglottis is small and in anterior position decreasing vallecular space.  Pt conduct multiple swallows with every bite/sip while holding her breath to decrease pharyngeal stasis.  Even with multiple reflexive swallows, pt continued with mild vallecular stasis noted across consistencies without pt awareness.  Advised pt to follow solids with liquids and conduct chin tuck with liquids and intermittent dry swallows.  Further advised if pt sensed stasis to take rest break.  Multiple small meals daily may help pt to compensate for multifactorial deficits.    Suspect pt's pharyngeal symptoms are primarily secondary to esophageal dysphagia.  Thoroughly educated pt and daughter [name] to findings and recommendations and suspicion of chronic dysphagia with decreased functional reserve due to aging.  Ruby was able to teach back information but pt was not and daughter states she has to repeat things to  her mother frequently- ? cognition.  Pt's cognition may negatively impact her ability to follow compensation strategies.  If pt becomes ill or severely deconditioned, her dysphagia may become severely exacerbated with worsening pulmonary ramifications.  Advised family to learn hemilch manuever for emergent use.      Treatment  Recommendation    xerostomia tips provided   Diet Recommendation Dysphagia 3 (Mechanical Soft);Thin liquid (extra gravies, sauces, casserole style foods easier)   Liquid Administration via: Cup;Straw Medication Administration: Crushed with puree (whole if contraindicated) Supervision: Full supervision/cueing for compensatory strategies Compensations: Slow rate;Small sips/bites;Follow solids with liquid (start meal with drinks) Postural Changes and/or Swallow Maneuvers: Chin tuck    Other  Recommendations Oral Care Recommendations: Oral care before and after PO   Follow Up Recommendations  None    Frequency and Duration     n/a   Pertinent Vitals/Pain Appeared comfortable    SLP Swallow Goals     General Date of Onset: 04/01/12 HPI: 79 yo female referred by [name] for MBS due to pt aspirating on esophagram Mar 18, 2012.  PMH + for dysphagia, Afib and remote DVT, HLD, HTN.  Pt reports dysphagia most notably since last summer when she had "choked" on robitussin.  She reports problems worsening since then, but admits to decreased overall strength.  Pt denies significant weight loss but does admit to recurrent colds. Medication list includes Norvasc, Lipitor, Lasix, Amaryl, Bystolic, Prilosec, Coumadin, Actigall, Travatan.  Pt previously had undergone a bronch with removal of lettuce from her lung when she was in her 70's per her daughter's statement.  Type of Study: Modified Barium Swallowing Study Reason for Referral: Objectively evaluate swallowing function Previous Swallow Assessment: Pt had a previous MBS in 2001 showing penetration of liquids that was prevented with chin tuck and vallecular pooling, Esophagram 03/18/12 showed penetration with individual swallows, small Zenker's diverticulum - study was ceased d/t penetration.   Diet Prior to this Study: Dysphagia 3 (soft);Thin liquids History of Recent Intubation: No Behavior/Cognition: Alert;Cooperative Oral Cavity - Dentition:  Adequate natural dentition Oral Motor / Sensory Function: Within functional limits Self-Feeding Abilities: Able to feed self Patient Positioning: Upright in chair Baseline Vocal Quality: Clear Volitional Cough: Strong Volitional Swallow: Able to elicit Anatomy: Within functional limits Pharyngeal Secretions:  (pt complains of pharyngeal secretion retention)    Reason for Referral Objectively evaluate swallowing function   Oral Phase Oral Preparation/Oral Phase Oral Phase: WFL Oral Phase - Comment Oral Phase - Comment: pt demonstrated piecemeal deglutition, suspect  this is compensatory for years of pharyngeal and esophageal dysphagia   Pharyngeal Phase Pharyngeal Phase Pharyngeal Phase: Impaired Pharyngeal - Nectar Pharyngeal - Nectar Teaspoon: Penetration/Aspiration during swallow;Pharyngeal residue - valleculae;Reduced airway/laryngeal closure;Reduced laryngeal elevation;Reduced epiglottic inversion;Reduced anterior laryngeal mobility;Reduced pharyngeal peristalsis Penetration/Aspiration details (nectar teaspoon): Material enters airway, remains ABOVE vocal cords and not ejected out Pharyngeal - Nectar Cup: Reduced tongue base retraction;Reduced airway/laryngeal closure;Reduced laryngeal elevation;Reduced anterior laryngeal mobility;Reduced epiglottic inversion;Reduced pharyngeal peristalsis;Lateral channel residue;Penetration/Aspiration during swallow Penetration/Aspiration details (nectar cup): Material enters airway, remains ABOVE vocal cords and not ejected out Pharyngeal - Thin Pharyngeal - Thin Teaspoon: Pharyngeal residue - valleculae;Reduced anterior laryngeal mobility;Reduced epiglottic inversion;Reduced pharyngeal peristalsis;Reduced laryngeal elevation;Reduced airway/laryngeal closure Pharyngeal - Thin Cup: Penetration/Aspiration during swallow;Reduced epiglottic inversion;Reduced anterior laryngeal mobility;Reduced pharyngeal peristalsis;Reduced laryngeal elevation;Reduced  airway/laryngeal closure Penetration/Aspiration details (thin cup): Material enters airway, remains ABOVE vocal cords and not ejected out Pharyngeal - Thin Straw: Reduced airway/laryngeal closure;Reduced laryngeal elevation;Reduced anterior laryngeal mobility;Reduced epiglottic inversion;Penetration/Aspiration during swallow Penetration/Aspiration  details (thin straw): Material enters airway, CONTACTS cords and not ejected out Pharyngeal - Solids Pharyngeal - Puree: Pharyngeal residue - valleculae;Reduced epiglottic inversion;Reduced anterior laryngeal mobility;Reduced pharyngeal peristalsis;Premature spillage to valleculae Pharyngeal - Regular: Reduced epiglottic inversion;Reduced pharyngeal peristalsis;Reduced anterior laryngeal mobility;Pharyngeal residue - valleculae;Premature spillage to valleculae Pharyngeal - Pill: Pharyngeal residue - cp segment (taken with pudding) Pharyngeal Phase - Comment Pharyngeal Comment: barium tablet lodged at CP region:  reflexive dry swallow pushed it further into esophagus, chin tuck posture eliminated penetration of thin liquid but doubt pt will complete routinely  Cervical Esophageal Phase    GO    Cervical Esophageal Phase Cervical Esophageal Phase: Impaired Cervical Esophageal Phase - Nectar Nectar Teaspoon: Prominent cricopharyngeal segment;Reduced cricopharyngeal relaxation Nectar Cup: Reduced cricopharyngeal relaxation;Prominent cricopharyngeal segment Cervical Esophageal Phase - Thin Thin Teaspoon: Reduced cricopharyngeal relaxation;Prominent cricopharyngeal segment Thin Cup: Reduced cricopharyngeal relaxation;Prominent cricopharyngeal segment Thin Straw: Prominent cricopharyngeal segment;Reduced cricopharyngeal relaxation Cervical Esophageal Phase - Solids Puree: Reduced cricopharyngeal relaxation;Prominent cricopharyngeal segment Regular: Prominent cricopharyngeal segment;Reduced cricopharyngeal relaxation Pill: Reduced cricopharyngeal  relaxation;Prominent cricopharyngeal segment Cervical Esophageal Phase - Comment Cervical Esophageal Comment: appearance of slow clearance distally after a few boluses of nectar thick liquids,  stasis of pudding throughout entire esophagus without pt sensation:  thin liquid swallow aided clearance but with mild retrograde propulsion of liquids.  Barium tablet appeared to initially lodge above CP -transiting into esophagus with reflexive dry swallow but again lodged at mid-esophagus.  Pt did not sense pharyngeal or esophageal stasis.  Pt required many boluses of pudding, water and thin barium to push tablet into stomach.  Findings appear consistent with dysmotility but radiologist not present to confirm.      Functional Assessment Tool Used: mbs, clinical judgement Functional Limitations: Swallowing Swallow Current Status ([id number]): At least 20 percent but less than 40 percent impaired, limited or restricted Swallow Goal Status [phone]): At least 20 percent but less than 40 percent impaired, limited or restricted Swallow Discharge Status [phone]): At least 20 percent but less than 40 percent impaired, limited or restricted    Donavan Burnet, MS [facility] SLP [phone]

## 2012-04-26 ENCOUNTER — Other Ambulatory Visit: Payer: Self-pay | Admitting: *Deleted

## 2012-04-26 MED ORDER — GLIMEPIRIDE 2 MG PO TABS: 2.0000 mg | ORAL_TABLET | Freq: Every day | ORAL | Status: DC

## 2012-04-27 ENCOUNTER — Other Ambulatory Visit: Payer: Self-pay | Admitting: *Deleted

## 2012-04-27 MED ORDER — GLIMEPIRIDE 2 MG PO TABS: 2.0000 mg | ORAL_TABLET | Freq: Every day | ORAL | Status: DC

## 2012-05-09 ENCOUNTER — Ambulatory Visit (INDEPENDENT_AMBULATORY_CARE_PROVIDER_SITE_OTHER): Payer: Medicare Other | Admitting: Pharmacist

## 2012-05-09 DIAGNOSIS — I4891 Unspecified atrial fibrillation: Secondary | ICD-10-CM

## 2012-05-09 LAB — POCT INR: INR: 3.1

## 2012-05-09 NOTE — Patient Instructions (Signed)
Anticoagulation Dose Instructions as of 05/09/2012     Glynis Smiles Tue Wed Thu Fri Sat   New Dose 4.5 mg 3 mg 3 mg 3 mg 4.5 mg 3 mg 3 mg    Description       No warfarin today (05/09/2012) then restart regular dose.

## 2012-05-19 ENCOUNTER — Ambulatory Visit (INDEPENDENT_AMBULATORY_CARE_PROVIDER_SITE_OTHER): Payer: Medicare Other | Admitting: Family Medicine

## 2012-05-19 ENCOUNTER — Other Ambulatory Visit (INDEPENDENT_AMBULATORY_CARE_PROVIDER_SITE_OTHER): Payer: Medicaid Other | Admitting: Pharmacist

## 2012-05-19 ENCOUNTER — Encounter: Payer: Self-pay | Admitting: Family Medicine

## 2012-05-19 VITALS — BP 138/68 | HR 59 | Temp 97.0°F | Ht 65.0 in | Wt 111.6 lb

## 2012-05-19 DIAGNOSIS — S61412A Laceration without foreign body of left hand, initial encounter: Secondary | ICD-10-CM | POA: Insufficient documentation

## 2012-05-19 DIAGNOSIS — R279 Unspecified lack of coordination: Secondary | ICD-10-CM

## 2012-05-19 DIAGNOSIS — Z23 Encounter for immunization: Secondary | ICD-10-CM

## 2012-05-19 DIAGNOSIS — R1319 Other dysphagia: Secondary | ICD-10-CM

## 2012-05-19 DIAGNOSIS — I4891 Unspecified atrial fibrillation: Secondary | ICD-10-CM

## 2012-05-19 DIAGNOSIS — R270 Ataxia, unspecified: Secondary | ICD-10-CM | POA: Insufficient documentation

## 2012-05-19 DIAGNOSIS — E119 Type 2 diabetes mellitus without complications: Secondary | ICD-10-CM

## 2012-05-19 DIAGNOSIS — S61409A Unspecified open wound of unspecified hand, initial encounter: Secondary | ICD-10-CM

## 2012-05-19 DIAGNOSIS — E785 Hyperlipidemia, unspecified: Secondary | ICD-10-CM

## 2012-05-19 DIAGNOSIS — M810 Age-related osteoporosis without current pathological fracture: Secondary | ICD-10-CM

## 2012-05-19 DIAGNOSIS — K745 Biliary cirrhosis, unspecified: Secondary | ICD-10-CM

## 2012-05-19 LAB — HEPATIC FUNCTION PANEL
ALT: 12 U/L (ref 0–35)
AST: 21 U/L (ref 0–37)
Albumin: 4.7 g/dL (ref 3.5–5.2)
Alkaline Phosphatase: 77 U/L (ref 39–117)
Bilirubin, Direct: 0.1 mg/dL (ref 0.0–0.3)
Indirect Bilirubin: 0.6 mg/dL (ref 0.0–0.9)
Total Bilirubin: 0.7 mg/dL (ref 0.3–1.2)
Total Protein: 7.4 g/dL (ref 6.0–8.3)

## 2012-05-19 LAB — BASIC METABOLIC PANEL WITH GFR
BUN: 25 mg/dL — ABNORMAL HIGH (ref 6–23)
CO2: 29 mEq/L (ref 19–32)
Calcium: 10.3 mg/dL (ref 8.4–10.5)
Chloride: 103 mEq/L (ref 96–112)
Creat: 1.1 mg/dL (ref 0.50–1.10)
GFR, Est African American: 52 mL/min — ABNORMAL LOW
GFR, Est Non African American: 45 mL/min — ABNORMAL LOW
Glucose, Bld: 81 mg/dL (ref 70–99)
Potassium: 4.1 mEq/L (ref 3.5–5.3)
Sodium: 145 mEq/L (ref 135–145)

## 2012-05-19 LAB — POCT GLYCOSYLATED HEMOGLOBIN (HGB A1C): Hemoglobin A1C: 6.8

## 2012-05-19 LAB — VITAMIN B12: Vitamin B-12: 608 pg/mL (ref 211–911)

## 2012-05-19 MED ORDER — DENOSUMAB 60 MG/ML ~~LOC~~ SOLN
60.0000 mg | Freq: Once | SUBCUTANEOUS | Status: AC
  Administered 2012-05-19: 60 mg via SUBCUTANEOUS

## 2012-05-19 NOTE — Patient Instructions (Addendum)
Tetanus, Diphtheria, Pertussis (Tdap) Vaccine What You Need to Know WHY GET VACCINATED? Tetanus, diphtheria and pertussis can be very serious diseases, even for adolescents and adults. Tdap vaccine can protect us from these diseases. TETANUS (Lockjaw) causes painful muscle tightening and stiffness, usually all over the body.  It can lead to tightening of muscles in the head and neck so you can't open your mouth, swallow, or sometimes even breathe. Tetanus kills about 1 out of 5 people who are infected. DIPHTHERIA can cause a thick coating to form in the back of the throat.  It can lead to breathing problems, paralysis, heart failure, and death. PERTUSSIS (Whooping Cough) causes severe coughing spells, which can cause difficulty breathing, vomiting and disturbed sleep.  It can also lead to weight loss, incontinence, and rib fractures. Up to 2 in 100 adolescents and 5 in 100 adults with pertussis are hospitalized or have complications, which could include pneumonia and death. These diseases are caused by bacteria. Diphtheria and pertussis are spread from person to person through coughing or sneezing. Tetanus enters the body through cuts, scratches, or wounds. Before vaccines, the [place] saw as many as 200,000 cases a year of diphtheria and pertussis, and hundreds of cases of tetanus. Since vaccination began, tetanus and diphtheria have dropped by about 99% and pertussis by about 80%. TDAP VACCINE Tdap vaccine can protect adolescents and adults from tetanus, diphtheria, and pertussis. One dose of Tdap is routinely given at age 11 or 12. People who did not get Tdap at that age should get it as soon as possible. Tdap is especially important for health care professionals and anyone having close contact with a baby younger than 12 months. Pregnant women should get a dose of Tdap during every pregnancy, to protect the newborn from pertussis. Infants are most at risk for severe, life-threatening  complications from pertussis. A similar vaccine, called Td, protects from tetanus and diphtheria, but not pertussis. A Td booster should be given every 10 years. Tdap may be given as one of these boosters if you have not already gotten a dose. Tdap may also be given after a severe cut or burn to prevent tetanus infection. Your doctor can give you more information. Tdap may safely be given at the same time as other vaccines. SOME PEOPLE SHOULD NOT GET THIS VACCINE  If you ever had a life-threatening allergic reaction after a dose of any tetanus, diphtheria, or pertussis containing vaccine, OR if you have a severe allergy to any part of this vaccine, you should not get Tdap. Tell your doctor if you have any severe allergies.  If you had a coma, or long or multiple seizures within 7 days after a childhood dose of DTP or DTaP, you should not get Tdap, unless a cause other than the vaccine was found. You can still get Td.  Talk to your doctor if you:  have epilepsy or another nervous system problem,  had severe pain or swelling after any vaccine containing diphtheria, tetanus or pertussis,  ever had Guillain-Barr Syndrome (GBS),  aren't feeling well on the day the shot is scheduled. RISKS OF A VACCINE REACTION With any medicine, including vaccines, there is a chance of side effects. These are usually mild and go away on their own, but serious reactions are also possible. Brief fainting spells can follow a vaccination, leading to injuries from falling. Sitting or lying down for about 15 minutes can help prevent these. Tell your doctor if you feel dizzy or light-headed, or   have vision changes or ringing in the ears. Mild problems following Tdap (Did not interfere with activities)  Pain where the shot was given (about 3 in 4 adolescents or 2 in 3 adults)  Redness or swelling where the shot was given (about 1 person in 5)  Mild fever of at least 100.70F (up to about 1 in 25 adolescents or 1 in  100 adults)  Headache (about 3 or 4 people in 10)  Tiredness (about 1 person in 3 or 4)  Nausea, vomiting, diarrhea, stomach ache (up to 1 in 4 adolescents or 1 in 10 adults)  Chills, body aches, sore joints, rash, swollen glands (uncommon) Moderate problems following Tdap (Interfered with activities, but did not require medical attention)  Pain where the shot was given (about 1 in 5 adolescents or 1 in 100 adults)  Redness or swelling where the shot was given (up to about 1 in 16 adolescents or 1 in 25 adults)  Fever over 102F (about 1 in 100 adolescents or 1 in 250 adults)  Headache (about 3 in 20 adolescents or 1 in 10 adults)  Nausea, vomiting, diarrhea, stomach ache (up to 1 or 3 people in 100)  Swelling of the entire arm where the shot was given (up to about 3 in 100). Severe problems following Tdap (Unable to perform usual activities, required medical attention)  Swelling, severe pain, bleeding and redness in the arm where the shot was given (rare). A severe allergic reaction could occur after any vaccine (estimated less than 1 in a million doses). WHAT IF THERE IS A SERIOUS REACTION? What should I look for?  Look for anything that concerns you, such as signs of a severe allergic reaction, very high fever, or behavior changes. Signs of a severe allergic reaction can include hives, swelling of the face and throat, difficulty breathing, a fast heartbeat, dizziness, and weakness. These would start a few minutes to a few hours after the vaccination. What should I do?  If you think it is a severe allergic reaction or other emergency that can't wait, call 9-1-1 or get the person to the nearest hospital. Otherwise, call your doctor.  Afterward, the reaction should be reported to the "Vaccine Adverse Event Reporting System" (VAERS). Your doctor might file this report, or you can do it yourself through the VAERS web site at www.vaers.LAgents.no, or by calling 1-445-525-1243. VAERS is  only for reporting reactions. They do not give medical advice.  THE NATIONAL VACCINE INJURY COMPENSATION PROGRAM The National Vaccine Injury Compensation Program (VICP) is a federal program that was created to compensate people who may have been injured by certain vaccines. Persons who believe they may have been injured by a vaccine can learn about the program and about filing a claim by calling 1-605-765-6473 or visiting the VICP website at SpiritualWord.at. HOW CAN I LEARN MORE?  Ask your doctor.  Call your local or state health department.  Contact the Centers for Disease Control and Prevention (CDC):  Call 873-164-1711 or visit CDC's website at PicCapture.uy CDC Tdap Vaccine VIS (06/18/11) Document Released: 07/28/2011 Document Reviewed: 07/28/2011 [facility] Patient Information 2013 [place], [place].   Pt aware that Medicare may not pay for Tdap vaccination.  Medicaid as a secondary insurance may cover.  ABN signed.

## 2012-05-19 NOTE — Progress Notes (Signed)
Patient ID: Stacey Brown, female   DOB: 24-Apr-1922, 79 y.o.   MRN: [id number] SUBJECTIVE:   HPI: For follow up of her multiple medical problems. These include dysphasia, she is seeing a speech and swallowing therapist. Just seen a gastroenterologist. Her problem is a esophageal dysphasia. 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 She is being treated and followed for this. No new changes. Atrial fibrillation has been stable. The patient was going to fall recently and the family member. He showed a grasper under fingernail of the family member produced a small cut on the dorsal left hand between the fifth and fourth metacarpal heads. Questionable when she had her last tetanus booster.   PMH/PSH: reviewed/updated in Epic  SH/FH: reviewed/updated in Epic  Allergies: reviewed/updated in Epic  Medications: reviewed/updated in Epic  Immunizations: reviewed/updated in Epic      ROS: As above in the HPI. All other systems are stable or negative.  OBJECTIVE:    Pleasant white female. No acute distress On examination she appeared in good health and spirits. Vital signs as documented. BP 138/68  Pulse 59  Temp(Src) 97 F (36.1 C) (Oral)  Ht 5\' 5"  (1.651 m)  Wt 111 lb 9.6 oz (50.621 kg)  BMI 18.57 kg/m2  Skin warm and dry and without overt rashes. She does have a small laceration of the dorsal aspect of the left hand in between the heads of the fourth and fifth metacarpal bones dorsal aspect. There is a certain amount of redness around the laceration. Looks more like a Neosporin reaction than a cellulitis. Head, Eyes, Ears, throat: normal Neck without JVD.  Lungs clear.  Heart exam notable for regular rhythm, normal sounds and absence of murmurs, rubs or gallops. Abdomen unremarkable and without evidence of organomegaly, masses, or abdominal aortic enlargement.  Extremities nonedematous. Neurologic: Walks with a high stepping gait. Slight ataxia.  ASSESSMENT:  Other dysphagia - Plan:  BASIC METABOLIC PANEL WITH GFR  Laceration of left hand, initial encounter - Plan: Tdap vaccine greater than or equal to 7yo IM  Atrial fibrillation - Plan: BASIC METABOLIC PANEL WITH GFR  PBC  Ataxia - Plan: Vitamin B12  Type II or unspecified type diabetes mellitus without mention of complication, not stated as uncontrolled - Plan: POCT glycosylated hemoglobin (Hb A1C), BASIC METABOLIC PANEL WITH GFR  HLD (hyperlipidemia) - Plan: Hepatic function panel, NMR Lipoprofile with Lipids    PLAN: Orders Placed This Encounter  Procedures  . Tdap vaccine greater than or equal to 7yo IM  . BASIC METABOLIC PANEL WITH GFR  . Hepatic function panel  . NMR Lipoprofile with Lipids  . Vitamin B12  . POCT glycosylated hemoglobin (Hb A1C)   Results for orders placed in visit on 05/19/12 (from the past 24 hour(s))  POCT GLYCOSYLATED HEMOGLOBIN (HGB A1C)     Status: None   Collection Time    05/19/12  1:50 PM      Result Value Range   Hemoglobin A1C 6.8     Meds ordered this encounter  Medications  . cromolyn (OPTICROM) 4 % ophthalmic solution    Sig:   . fexofenadine (ALLEGRA) 180 MG tablet    Sig: Take 180 mg by mouth daily.   await lab work up in the vitamin B12 because of the high stepping gait. Rule out posterior cord. Degenerative disease from vitamin B12 deficiency. Await the lab work.  Chae Shuster P. [place] Charon, M.D.

## 2012-05-20 LAB — NMR LIPOPROFILE WITH LIPIDS
Cholesterol, Total: 182 mg/dL (ref <200)
HDL Particle Number: 26.3 umol/L — ABNORMAL LOW (ref >=30.5)
HDL Size: 10 nm (ref >=9.2)
HDL-C: 35 mg/dL — ABNORMAL LOW (ref >=40)
LDL (calc): 96 mg/dL (ref <100)
LDL Particle Number: 1512 nmol/L — ABNORMAL HIGH (ref <1000)
LDL Size: 19.9 nm — ABNORMAL LOW (ref >20.5)
LP-IR Score: 61 — ABNORMAL HIGH (ref <=45)
Large HDL-P: 5.7 umol/L (ref >=4.8)
Large VLDL-P: 5.8 nmol/L — ABNORMAL HIGH (ref <=2.7)
Small LDL Particle Number: 1234 nmol/L — ABNORMAL HIGH (ref <=527)
Triglycerides: 253 mg/dL — ABNORMAL HIGH (ref <150)
VLDL Size: 56.9 nm — ABNORMAL HIGH (ref <=46.6)

## 2012-05-20 NOTE — Progress Notes (Signed)
Quick Note:  Labs abnormal.Not at goals. Appointment with Pharmacist to review meds and adjust treatments. Thanks ______

## 2012-06-09 ENCOUNTER — Ambulatory Visit (INDEPENDENT_AMBULATORY_CARE_PROVIDER_SITE_OTHER): Payer: Medicare Other | Admitting: Pharmacist

## 2012-06-09 DIAGNOSIS — E785 Hyperlipidemia, unspecified: Secondary | ICD-10-CM

## 2012-06-09 DIAGNOSIS — I4891 Unspecified atrial fibrillation: Secondary | ICD-10-CM

## 2012-06-09 DIAGNOSIS — E1169 Type 2 diabetes mellitus with other specified complication: Secondary | ICD-10-CM

## 2012-06-09 MED ORDER — ROSUVASTATIN CALCIUM 20 MG PO TABS: 20.0000 mg | ORAL_TABLET | Freq: Every day | ORAL | Status: DC

## 2012-06-09 NOTE — Patient Instructions (Addendum)
   Anticoagulation Dose Instructions as of 06/09/2012     Stacey Brown Tue Wed Thu Fri Sat   New Dose 3 mg 3 mg 3 mg 3 mg 3 mg 3 mg 3 mg    Description       Since starting Crestor will decrease warfarin to 3mg  1 tablet every day. Recheck protime in 2 weeks.      Stop atorvastatin 40mg    Start crestor 20mg  - take  1 tablet daily for cholestrol.

## 2012-06-09 NOTE — Patient Instructions (Signed)
Anticoagulation Dose Instructions as of 06/09/2012     Stacey Brown Tue Wed Thu Fri Sat   New Dose 4.5 mg 3 mg 3 mg 3 mg 4.5 mg 3 mg 3 mg    Description       Since starting Crestor will decrease warfarin to 3mg  1 tablet every day. Recheck protime in 2 weeks.     Stop atorvastatin 40mg    Start crestor 20mg  - take  1 tablet daily for cholestrol.

## 2012-06-09 NOTE — Progress Notes (Signed)
Lipid Clinic Consultation  Chief Complaint:   Chief Complaint  Patient presents with  . Hyperlipidemia     Exam Edema:  neg Respirations:  normal     General Appearance:  small build elderly female Mood/Affect:  normal  HPI:  Patient has been taking atorvastatin 40mg  daily for hyperlipidemia.  Most recent labs show elevated LDL-P and Tg.      Component Value Date/Time   TRIG 253* 05/19/2012 1316    Assessment: CHD/CHF Risk Equivalents:  ASCUD and DM     Current NCEP Goals: LDL Goal < 70 HDL Goal >/= 45 Tg Goal < 308 Non-HDL Goal < 100  Low fat diet followed?  Yes -   Low carb diet followed?  Yes -   Exercise?  No -    INR was 2.6 today  Assessment: 1.  Dyslipidemia - elevated Tg and LDL-P 2.  Therapeutic anticoagulation  Recommendations: Changes in lipid medication(s):   Discontinue atorvastatin          Start Crestor 20mg    Take 1 tablet daily  Recheck Lipid Panel: 3 months   Anticoagulation Dose Instructions as of 06/09/2012     Glynis Smiles Tue Wed Thu Fri Sat   New Dose 3 mg 3 mg 3 mg 3 mg 3 mg 3 mg 3 mg    Description       Since starting Crestor will decrease warfarin to 3mg  1 tablet every day. Recheck protime in 2 weeks.         Time spent counseling patient:  30 minutes   Henrene Pastor, PharmD, CPP

## 2012-06-21 ENCOUNTER — Encounter: Payer: Self-pay | Admitting: Pharmacist Clinician (PhC)/ Clinical Pharmacy Specialist

## 2012-06-23 ENCOUNTER — Other Ambulatory Visit: Payer: Self-pay | Admitting: Family Medicine

## 2012-07-18 ENCOUNTER — Ambulatory Visit (INDEPENDENT_AMBULATORY_CARE_PROVIDER_SITE_OTHER): Payer: Medicare Other | Admitting: Pharmacist

## 2012-07-18 DIAGNOSIS — I4891 Unspecified atrial fibrillation: Secondary | ICD-10-CM

## 2012-07-18 LAB — POCT INR: INR: 2.5

## 2012-07-18 NOTE — Patient Instructions (Signed)
Anticoagulation Dose Instructions as of 07/18/2012     Glynis Smiles Tue Wed Thu Fri Sat   New Dose 3 mg 3 mg 3 mg 3 mg 3 mg 3 mg 3 mg    Description       Continue  3mg  1 tablet every day.        INR was 2.5 today

## 2012-08-09 ENCOUNTER — Other Ambulatory Visit: Payer: Self-pay | Admitting: *Deleted

## 2012-08-09 MED ORDER — FUROSEMIDE 20 MG PO TABS: 20.0000 mg | ORAL_TABLET | Freq: Every day | ORAL | Status: DC

## 2012-08-19 ENCOUNTER — Encounter: Payer: Self-pay | Admitting: Family Medicine

## 2012-08-19 ENCOUNTER — Ambulatory Visit (INDEPENDENT_AMBULATORY_CARE_PROVIDER_SITE_OTHER): Payer: Medicare Other | Admitting: Family Medicine

## 2012-08-19 VITALS — BP 142/65 | HR 57 | Temp 97.0°F | Wt 111.2 lb

## 2012-08-19 DIAGNOSIS — K745 Biliary cirrhosis, unspecified: Secondary | ICD-10-CM

## 2012-08-19 DIAGNOSIS — I4891 Unspecified atrial fibrillation: Secondary | ICD-10-CM

## 2012-08-19 DIAGNOSIS — E119 Type 2 diabetes mellitus without complications: Secondary | ICD-10-CM

## 2012-08-19 DIAGNOSIS — E785 Hyperlipidemia, unspecified: Secondary | ICD-10-CM

## 2012-08-19 DIAGNOSIS — E1169 Type 2 diabetes mellitus with other specified complication: Secondary | ICD-10-CM

## 2012-08-19 LAB — COMPLETE METABOLIC PANEL WITH GFR
ALT: 13 U/L (ref 0–35)
AST: 23 U/L (ref 0–37)
Albumin: 4.3 g/dL (ref 3.5–5.2)
Alkaline Phosphatase: 79 U/L (ref 39–117)
BUN: 23 mg/dL (ref 6–23)
CO2: 30 mEq/L (ref 19–32)
Calcium: 9.4 mg/dL (ref 8.4–10.5)
Chloride: 102 mEq/L (ref 96–112)
Creat: 1.13 mg/dL — ABNORMAL HIGH (ref 0.50–1.10)
GFR, Est African American: 50 mL/min — ABNORMAL LOW
GFR, Est Non African American: 44 mL/min — ABNORMAL LOW
Glucose, Bld: 84 mg/dL (ref 70–99)
Potassium: 4.1 mEq/L (ref 3.5–5.3)
Sodium: 143 mEq/L (ref 135–145)
Total Bilirubin: 0.9 mg/dL (ref 0.3–1.2)
Total Protein: 6.9 g/dL (ref 6.0–8.3)

## 2012-08-19 LAB — POCT INR: INR: 1.8

## 2012-08-19 LAB — POCT GLYCOSYLATED HEMOGLOBIN (HGB A1C): Hemoglobin A1C: 6.2

## 2012-08-19 MED ORDER — PRAVASTATIN SODIUM 20 MG PO TABS: 20.0000 mg | ORAL_TABLET | Freq: Every day | ORAL | Status: DC

## 2012-08-19 MED ORDER — ROSUVASTATIN CALCIUM 20 MG PO TABS: 20.0000 mg | ORAL_TABLET | Freq: Every day | ORAL | Status: DC

## 2012-08-19 NOTE — Patient Instructions (Addendum)
Dr Paula Libra Recommendations  Diet and Exercise discussed with patient.  For nutrition information, I recommend books:  1).Eat to Live by Dr Excell Seltzer. 2).Prevent and Reverse Heart Disease by Dr Karl Luke. 3) Dr Janene Harvey Book:  Program to Reverse Diabetes  Exercise recommendations are:  If unable to walk, then the patient can exercise in a chair 3 times a day. By flapping arms like a bird gently and raising legs outwards to the front.  If ambulatory, the patient can go for walks for 30 minutes 3 times a week. Then increase the intensity and duration as tolerated.  Goal is to try to attain exercise frequency to 5 times a week.  If applicable: Best to perform resistance exercises (machines or weights) 2 days a week and cardio type exercises 3 days per week.   Diabetes and Foot Care Diabetes may cause you to have a poor blood supply (circulation) to your legs and feet. Because of this, the skin may be thinner, break easier, and heal more slowly. You also may have nerve damage in your legs and feet causing decreased feeling. You may not notice minor injuries to your feet that could lead to serious problems or infections. Taking care of your feet is one of the most important things you can do for yourself.  HOME CARE INSTRUCTIONS  Do not go barefoot. Bare feet are easily injured.  Check your feet daily for blisters, cuts, and redness.  Wash your feet with warm water (not hot) and mild soap. Pat your feet and between your toes until completely dry.  Apply a moisturizing lotion that does not contain alcohol or petroleum jelly to the dry skin on your feet and to dry brittle toenails. Do not put it between your toes.  Trim your toenails straight across. Do not dig under them or around the cuticle.  Do not cut corns or calluses, or try to remove them with medicine.  Wear clean cotton socks or stockings every day. Make sure they are not too tight. Do not wear  knee high stockings since they may decrease blood flow to your legs.  Wear leather shoes that fit properly and have enough cushioning. To break in new shoes, wear them just a few hours a day to avoid injuring your feet.  Wear shoes at all times, even in the house.  Do not cross your legs. This may decrease the blood flow to your feet.  If you find a minor scrape, cut, or break in the skin on your feet, keep it and the skin around it clean and dry. These areas may be cleansed with mild soap and water. Do not use peroxide, alcohol, iodine or Merthiolate.  When you remove an adhesive bandage, be sure not to harm the skin around it.  If you have a wound, look at it several times a day to make sure it is healing.  Do not use heating pads or hot water bottles. Burns can occur. If you have lost feeling in your feet or legs, you may not know it is happening until it is too late.  Report any cuts, sores or bruises to your caregiver. Do not wait! SEEK MEDICAL CARE IF:   You have an injury that is not healing or you notice redness, numbness, burning, or tingling.  Your feet always feel cold.  You have pain or cramps in your legs and feet. SEEK IMMEDIATE MEDICAL CARE IF:   There is increasing redness, swelling,  or increasing pain in the wound.  There is a red line that goes up your leg.  Pus is coming from a wound.  You develop an unexplained oral temperature above 102 F (38.9 C), or as your caregiver suggests.  You notice a bad smell coming from an ulcer or wound. MAKE SURE YOU:   Understand these instructions.  Will watch your condition.  Will get help right away if you are not doing well or get worse. Document Released: 01/24/2000 Document Revised: 04/20/2011 Document Reviewed: 08/01/2008 [facility] Patient Information 2014 [place], [place].   Type 2 Diabetes Mellitus, Adult Type 2 diabetes mellitus, often simply referred to as type 2 diabetes, is a long-lasting (chronic)  disease. In type 2 diabetes, the pancreas does not make enough insulin (a hormone), the cells are less responsive to the insulin that is made (insulin resistance), or both. Normally, insulin moves sugars from food into the tissue cells. The tissue cells use the sugars for energy. The lack of insulin or the lack of normal response to insulin causes excess sugars to build up in the blood instead of going into the tissue cells. As a result, high blood sugar (hyperglycemia) develops. The effect of high sugar (glucose) levels can cause many complications. Type 2 diabetes was also previously called adult-onset diabetes but it can occur at any age.  RISK FACTORS  A person is predisposed to developing type 2 diabetes if someone in the family has the disease and also has one or more of the following primary risk factors:  Overweight.  An inactive lifestyle.  A history of consistently eating high-calorie foods. Maintaining a normal weight and regular physical activity can reduce the chance of developing type 2 diabetes. SYMPTOMS  A person with type 2 diabetes may not show symptoms initially. The symptoms of type 2 diabetes appear slowly. The symptoms include:  Increased thirst (polydipsia).  Increased urination (polyuria).  Increased urination during the night (nocturia).  Weight loss. This weight loss may be rapid.  Frequent, recurring infections.  Tiredness (fatigue).  Weakness.  Vision changes, such as blurred vision.  Fruity smell to your breath.  Abdominal pain.  Nausea or vomiting.  Cuts or bruises which are slow to heal.  Tingling or numbness in the hands or feet. DIAGNOSIS Type 2 diabetes is frequently not diagnosed until complications of diabetes are present. Type 2 diabetes is diagnosed when symptoms or complications are present and when blood glucose levels are increased. Your blood glucose level may be checked by one or more of the following blood tests:  A fasting  blood glucose test. You will not be allowed to eat for at least 8 hours before a blood sample is taken.  A random blood glucose test. Your blood glucose is checked at any time of the day regardless of when you ate.  A hemoglobin A1c blood glucose test. A hemoglobin A1c test provides information about blood glucose control over the previous 3 months.  An oral glucose tolerance test (OGTT). Your blood glucose is measured after you have not eaten (fasted) for 2 hours and then after you drink a glucose-containing beverage. TREATMENT   You may need to take insulin or diabetes medicine daily to keep blood glucose levels in the desired range.  You will need to match insulin dosing with exercise and healthy food choices. The treatment goal is to maintain the before meal blood sugar (preprandial glucose) level at 70 130 mg/dL. HOME CARE INSTRUCTIONS   Have your hemoglobin A1c level checked  twice a year.  Perform daily blood glucose monitoring as directed by your caregiver.  Monitor urine ketones when you are ill and as directed by your caregiver.  Take your diabetes medicine or insulin as directed by your caregiver to maintain your blood glucose levels in the desired range.  Never run out of diabetes medicine or insulin. It is needed every day.  Adjust insulin based on your intake of carbohydrates. Carbohydrates can raise blood glucose levels but need to be included in your diet. Carbohydrates provide vitamins, minerals, and fiber which are an essential part of a healthy diet. Carbohydrates are found in fruits, vegetables, whole grains, dairy products, legumes, and foods containing added sugars.    Eat healthy foods. Alternate 3 meals with 3 snacks.  Lose weight if overweight.  Carry a medical alert card or wear your medical alert jewelry.  Carry a 15 gram carbohydrate snack with you at all times to treat low blood glucose (hypoglycemia). Some examples of 15 gram carbohydrate snacks  include:  Glucose tablets, 3 or 4   Glucose gel, 15 gram tube  Raisins, 2 tablespoons (24 grams)  Jelly beans, 6  Animal crackers, 8  Regular pop, 4 ounces (120 mL)  Gummy treats, 9  Recognize hypoglycemia. Hypoglycemia occurs with blood glucose levels of 70 mg/dL and below. The risk for hypoglycemia increases when fasting or skipping meals, during or after intense exercise, and during sleep. Hypoglycemia symptoms can include:  Tremors or shakes.  Decreased ability to concentrate.  Sweating.  Increased heart rate.  Headache.  Dry mouth.  Hunger.  Irritability.  Anxiety.  Restless sleep.  Altered speech or coordination.  Confusion.  Treat hypoglycemia promptly. If you are alert and able to safely swallow, follow the 15:15 rule:  Take 15 20 grams of rapid-acting glucose or carbohydrate. Rapid-acting options include glucose gel, glucose tablets, or 4 ounces (120 mL) of fruit juice, regular soda, or low fat milk.  Check your blood glucose level 15 minutes after taking the glucose.  Take 15 20 grams more of glucose if the repeat blood glucose level is still 70 mg/dL or below.  Eat a meal or snack within 1 hour once blood glucose levels return to normal.    Be alert to polyuria and polydipsia which are early signs of hyperglycemia. An early awareness of hyperglycemia allows for prompt treatment. Treat hyperglycemia as directed by your caregiver.  Engage in at least 150 minutes of moderate-intensity physical activity a week, spread over at least 3 days of the week or as directed by your caregiver. In addition, you should engage in resistance exercise at least 2 times a week or as directed by your caregiver.  Adjust your medicine and food intake as needed if you start a new exercise or sport.  Follow your sick day plan at any time you are unable to eat or drink as usual.  Avoid tobacco use.  Limit alcohol intake to no more than 1 drink per day for  nonpregnant women and 2 drinks per day for men. You should drink alcohol only when you are also eating food. Talk with your caregiver whether alcohol is safe for you. Tell your caregiver if you drink alcohol several times a week.  Follow up with your caregiver regularly.  Schedule an eye exam soon after the diagnosis of type 2 diabetes and then annually.  Perform daily skin and foot care. Examine your skin and feet daily for cuts, bruises, redness, nail problems, bleeding, blisters, or sores. A  foot exam by a caregiver should be done annually.  Brush your teeth and gums at least twice a day and floss at least once a day. Follow up with your dentist regularly.  Share your diabetes management plan with your workplace or school.  Stay up-to-date with immunizations.  Learn to manage stress.  Obtain ongoing diabetes education and support as needed.  Participate in, or seek rehabilitation as needed to maintain or improve independence and quality of life. Request a physical or occupational therapy referral if you are having foot or hand numbness or difficulties with grooming, dressing, eating, or physical activity. SEEK MEDICAL CARE IF:   You are unable to eat food or drink fluids for more than 6 hours.  You have nausea and vomiting for more than 6 hours.  Your blood glucose level is over 240 mg/dL.  There is a change in mental status.  You develop an additional serious illness.  You have diarrhea for more than 6 hours.  You have been sick or have had a fever for a couple of days and are not getting better.  You have pain during any physical activity.  SEEK IMMEDIATE MEDICAL CARE IF:  You have difficulty breathing.  You have moderate to large ketone levels. MAKE SURE YOU:  Understand these instructions.  Will watch your condition.  Will get help right away if you are not doing well or get worse. Document Released: 01/26/2005 Document Revised: 10/21/2011 Document Reviewed:  08/25/2011 [facility] Patient Information 2014 [place], [place].

## 2012-08-19 NOTE — Progress Notes (Signed)
Patient ID: Stacey Brown, female   DOB: 1922/08/10, 79 y.o.   MRN: [id number] SUBJECTIVE: CC: Chief Complaint  Patient presents with  . Follow-up    3 month follow up c/o runny nose fasting . states get dizzy thinks its her eyes  .     HPI:  Here with daughter:  Patient is here for follow up of 1)Diabetes Mellitus: Symptoms of DM: Denies Nocturia ,Denies Urinary Frequency , denies Blurred vision ,deniesDizziness,denies.Dysuria,denies paresthesias, denies extremity pain or ulcers.Marland Kitchendenies chest pain. has had an annual eye exam.macular degeneration. do check the feet. Does check CBGs. Average CBG:92 Denies episodes of hypoglycemia. Does have an emergency hypoglycemic plan. admits toCompliance with medications. Denies Problems with medications.  2)Dysphagia: doing okay  3)atrial fibrillation: on warfarin  4)Primary Biliary cirrhosis: no changes  Past Medical History  Diagnosis Date  . Abnormal liver function test   . Arthritis   . Atrial fibrillation   . Chronic anticoagulation   . History of DVT of lower extremity   . Primary biliary cirrhosis   . Diabetes mellitus without complication   . Allergy     allergic  rhinitis  . Hyperlipidemia   . H/O TB (tuberculosis)   . Chronic kidney disease   . Glaucoma   . Esophageal stricture   . Macular degeneration   . Osteoporosis   . ASCVD (arteriosclerotic cardiovascular disease)    Past Surgical History  Procedure Laterality Date  . Abdominal hysterectomy    . Minor hemorrhoidectomy    . Left breast biopsy    . Cholecystectomy    . Herniography    . Right lung biopsy    . Right hip replacement    . Liver biopsy     History   Social History  . Marital Status: Widowed    Spouse Name: N/A    Number of Children: N/A  . Years of Education: N/A   Occupational History  . Retired     Disabled   Social History Main Topics  . Smoking status: Never Smoker   . Smokeless tobacco: Never Used  . Alcohol Use: No  .  Drug Use: No  . Sexually Active: Not on file   Other Topics Concern  . Not on file   Social History Narrative   Daily caffeine Use   Family History  Problem Relation Age of Onset  . Breast cancer Other     [place]  . Cirrhosis Other     Nephew  . Heart disease Mother   . Heart disease Brother   . Diabetes Mother     Brother,  . Kidney disease Cousin   . Colon cancer Neg Hx    Current Outpatient Prescriptions on File Prior to Visit  Medication Sig Dispense Refill  . amLODipine (NORVASC) 5 MG tablet Take 5 mg by mouth daily.      . carboxymethylcellulose (REFRESH PLUS) 0.5 % SOLN 1 drop 3 (three) times daily as needed.      . cromolyn (OPTICROM) 4 % ophthalmic solution       . fexofenadine (ALLEGRA) 180 MG tablet Take 180 mg by mouth daily.      . furosemide (LASIX) 20 MG tablet Take 1 tablet (20 mg total) by mouth daily.  30 tablet  1  . glimepiride (AMARYL) 2 MG tablet TAKE 1 TABLET (2 MG TOTAL) BY MOUTH DAILY BEFORE BREAKFAST.  30 tablet  2  . nebivolol (BYSTOLIC) 5 MG tablet Take 5 mg by mouth daily.      Marland Kitchen  omeprazole (PRILOSEC) 40 MG capsule Take 40 mg by mouth daily.      . rosuvastatin (CRESTOR) 20 MG tablet Take 1 tablet (20 mg total) by mouth daily.  35 tablet  0  . travoprost, benzalkonium, (TRAVATAN) 0.004 % ophthalmic solution Place 1 drop into both eyes at bedtime.      . ursodiol (ACTIGALL) 300 MG capsule TAKE ONE CAPSULE BY MOUTH TWICE DAILY  60 capsule  11  . warfarin (COUMADIN) 3 MG tablet Take 3 mg by mouth daily.       No current facility-administered medications on file prior to visit.   No Known Allergies Immunization History  Administered Date(s) Administered  . Tdap 05/19/2012   Prior to Admission medications   Medication Sig Start Date End Date Taking? Authorizing Provider  amLODipine (NORVASC) 5 MG tablet Take 5 mg by mouth daily.   Yes Historical Provider, MD  carboxymethylcellulose (REFRESH PLUS) 0.5 % SOLN 1 drop 3 (three) times daily as needed.    Yes Historical Provider, MD  cromolyn (OPTICROM) 4 % ophthalmic solution  04/22/12  Yes Historical Provider, MD  fexofenadine (ALLEGRA) 180 MG tablet Take 180 mg by mouth daily.   Yes Historical Provider, MD  furosemide (LASIX) 20 MG tablet Take 1 tablet (20 mg total) by mouth daily. 08/09/12  Yes Ernestina Penna, MD  glimepiride (AMARYL) 2 MG tablet TAKE 1 TABLET (2 MG TOTAL) BY MOUTH DAILY BEFORE BREAKFAST. 06/23/12  Yes Ileana Ladd, MD  nebivolol (BYSTOLIC) 5 MG tablet Take 5 mg by mouth daily.   Yes Historical Provider, MD  omeprazole (PRILOSEC) 40 MG capsule Take 40 mg by mouth daily.   Yes Historical Provider, MD  rosuvastatin (CRESTOR) 20 MG tablet Take 1 tablet (20 mg total) by mouth daily. 06/09/12  Yes Tammy Eckard, PHARMD  travoprost, benzalkonium, (TRAVATAN) 0.004 % ophthalmic solution Place 1 drop into both eyes at bedtime.   Yes Historical Provider, MD  ursodiol (ACTIGALL) 300 MG capsule TAKE ONE CAPSULE BY MOUTH TWICE DAILY 06/29/11  Yes Louis Meckel, MD  warfarin (COUMADIN) 3 MG tablet Take 3 mg by mouth daily.   Yes Historical Provider, MD      ROS: As above in the HPI. All other systems are stable or negative.  OBJECTIVE: APPEARANCE:  Patient in no acute distress.The patient appeared well nourished and normally developed. Acyanotic. Waist: VITAL SIGNS:BP 142/65  Pulse 57  Temp(Src) 97 F (36.1 C) (Oral)  Wt 111 lb 3.2 oz (50.44 kg)  BMI 18.5 kg/m2 Elderly WF  SKIN: warm and  Dry without overt rashes, tattoos and scars  HEAD and Neck: without JVD, Head and scalp: normal Eyes:No scleral icterus. Legally blind wears dark glasses/shades Ears: Auricle normal, canal normal, Tympanic membranes normal, insufflation normal. Nose: normal Throat: normal Neck & thyroid: normal  CHEST & LUNGS: Chest wall: normal Lungs: Clear  CVS: Reveals the PMI to be normally located. irregular rhythm but controlled rate First and Second Heart sounds are normal,  absence of  murmurs, rubs or gallops. Peripheral vasculature: Radial pulses: normal rate  ABDOMEN:  Appearance: normal Benign, no organomegaly, no masses, no Abdominal Aortic enlargement. No Guarding , no rebound. No Bruits. Bowel sounds: normal  RECTAL: N/A GU: N/A  EXTREMETIES: nonedematous.  MUSCULOSKELETAL:  Spine: reduced ROM Joints: intact, but reduced ROM due to OA changes and  discomfort  NEUROLOGIC: oriented to time,place and person; nonfocal.   ASSESSMENT: Type II or unspecified type diabetes mellitus without mention of complication, not stated  as uncontrolled - Plan: POCT glycosylated hemoglobin (Hb A1C), COMPLETE METABOLIC PANEL WITH GFR  PBC  Atrial fibrillation  HLD (hyperlipidemia) - Plan: COMPLETE METABOLIC PANEL WITH GFR, NMR Lipoprofile with Lipids, pravastatin (PRAVACHOL) 20 MG tablet, DISCONTINUED: rosuvastatin (CRESTOR) 20 MG tablet  A-fib - Plan: POCT INR  Dyslipidemia associated with type 2 diabetes mellitus - Plan: DISCONTINUED: rosuvastatin (CRESTOR) 20 MG tablet   PLAN: Orders Placed This Encounter  Procedures  . COMPLETE METABOLIC PANEL WITH GFR  . NMR Lipoprofile with Lipids  . POCT INR  . POCT glycosylated hemoglobin (Hb A1C)   Meds ordered this encounter  Medications  . DISCONTD: rosuvastatin (CRESTOR) 20 MG tablet    Sig: Take 1 tablet (20 mg total) by mouth daily.    Dispense:  35 tablet    Refill:  5  . pravastatin (PRAVACHOL) 20 MG tablet    Sig: Take 1 tablet (20 mg total) by mouth daily.    Dispense:  30 tablet    Refill:  5   Results for orders placed in visit on 08/19/12  POCT INR      Result Value Range   INR 1.8    POCT GLYCOSYLATED HEMOGLOBIN (HGB A1C)      Result Value Range   Hemoglobin A1C 6.2     Patient had  Ran out of the crestor for 1 week and this may have  Affected the INR. Cannot afford the Crestor. Will switch to Pravastatin.       Dr Woodroe Mode Recommendations  Diet and Exercise discussed with  patient.  For nutrition information, I recommend books:  1).Eat to Live by Dr Monico Hoar. 2).Prevent and Reverse Heart Disease by Dr Suzzette Righter. 3) Dr Katherina Right Book:  Program to Reverse Diabetes  Exercise recommendations are:  If unable to walk, then the patient can exercise in a chair 3 times a day. By flapping arms like a bird gently and raising legs outwards to the front.  If ambulatory, the patient can go for walks for 30 minutes 3 times a week. Then increase the intensity and duration as tolerated.  Goal is to try to attain exercise frequency to 5 times a week.  If applicable: Best to perform resistance exercises (machines or weights) 2 days a week and cardio type exercises 3 days per week. Above discussed with the daughter and additional DM care handouts given in the AVS for daughter to review and help in the care of the patient.  Return in about 1 week (around 08/26/2012) for recheck protime.  Arabell Neria P. [place] Charon, M.D.

## 2012-08-22 ENCOUNTER — Other Ambulatory Visit: Payer: Self-pay

## 2012-08-22 MED ORDER — VITAMIN D (ERGOCALCIFEROL) 1.25 MG (50000 UNIT) PO CAPS: 50000.0000 [IU] | ORAL_CAPSULE | ORAL | Status: DC

## 2012-08-22 NOTE — Telephone Encounter (Signed)
This can be okay for one month, but patient needs to have a vitamin D level checked

## 2012-08-22 NOTE — Telephone Encounter (Signed)
Last vit D level 09/23/11  Level was normal at 44  ACM

## 2012-08-23 LAB — NMR LIPOPROFILE WITH LIPIDS
Cholesterol, Total: 151 mg/dL (ref <200)
HDL Particle Number: 25.1 umol/L — ABNORMAL LOW (ref >=30.5)
HDL Size: 8.9 nm — ABNORMAL LOW (ref >=9.2)
HDL-C: 35 mg/dL — ABNORMAL LOW (ref >=40)
LDL (calc): 83 mg/dL (ref <100)
LDL Particle Number: 1712 nmol/L — ABNORMAL HIGH (ref <1000)
LDL Size: 20 nm — ABNORMAL LOW (ref >20.5)
LP-IR Score: 45 (ref <=45)
Large HDL-P: 3 umol/L — ABNORMAL LOW (ref >=4.8)
Large VLDL-P: <0.8 nmol/L (ref <=2.7)
Small LDL Particle Number: 1244 nmol/L — ABNORMAL HIGH (ref <=527)
Triglycerides: 167 mg/dL — ABNORMAL HIGH (ref <150)
VLDL Size: 47.3 nm — ABNORMAL HIGH (ref <=46.6)

## 2012-09-08 ENCOUNTER — Ambulatory Visit (INDEPENDENT_AMBULATORY_CARE_PROVIDER_SITE_OTHER): Payer: Medicare Other | Admitting: Pharmacist

## 2012-09-08 DIAGNOSIS — I4891 Unspecified atrial fibrillation: Secondary | ICD-10-CM

## 2012-09-08 LAB — POCT INR: INR: 2.3

## 2012-09-08 NOTE — Patient Instructions (Signed)
Anticoagulation Dose Instructions as of 09/08/2012     Stacey Brown Tue Wed Thu Fri Sat   New Dose 4.5 mg 3 mg 3 mg 3 mg 4.5 mg 3 mg 3 mg    Description       Continue 3mg  daily except 4.5mg  [= 1 and 1/2 tablet] on Sunday and Thursdays.       INR was 2.3 today

## 2012-09-20 ENCOUNTER — Ambulatory Visit (INDEPENDENT_AMBULATORY_CARE_PROVIDER_SITE_OTHER): Payer: Medicare Other | Admitting: Pharmacist Clinician (PhC)/ Clinical Pharmacy Specialist

## 2012-09-20 DIAGNOSIS — I4891 Unspecified atrial fibrillation: Secondary | ICD-10-CM

## 2012-09-30 ENCOUNTER — Other Ambulatory Visit: Payer: Self-pay | Admitting: Family Medicine

## 2012-10-08 ENCOUNTER — Other Ambulatory Visit: Payer: Self-pay | Admitting: Family Medicine

## 2012-10-11 ENCOUNTER — Ambulatory Visit (INDEPENDENT_AMBULATORY_CARE_PROVIDER_SITE_OTHER): Payer: Medicare Other | Admitting: Pharmacist Clinician (PhC)/ Clinical Pharmacy Specialist

## 2012-10-11 DIAGNOSIS — I4891 Unspecified atrial fibrillation: Secondary | ICD-10-CM

## 2012-10-26 ENCOUNTER — Other Ambulatory Visit: Payer: Self-pay | Admitting: *Deleted

## 2012-10-26 MED ORDER — WARFARIN SODIUM 3 MG PO TABS: ORAL_TABLET | ORAL | Status: DC

## 2012-11-03 ENCOUNTER — Ambulatory Visit (INDEPENDENT_AMBULATORY_CARE_PROVIDER_SITE_OTHER): Payer: Medicare Other

## 2012-11-03 ENCOUNTER — Encounter: Payer: Self-pay | Admitting: Family Medicine

## 2012-11-03 ENCOUNTER — Ambulatory Visit (INDEPENDENT_AMBULATORY_CARE_PROVIDER_SITE_OTHER): Payer: Medicare Other | Admitting: Family Medicine

## 2012-11-03 VITALS — BP 118/59 | HR 58 | Temp 97.0°F | Ht 64.0 in | Wt 110.8 lb

## 2012-11-03 DIAGNOSIS — R0609 Other forms of dyspnea: Secondary | ICD-10-CM

## 2012-11-03 DIAGNOSIS — K743 Primary biliary cirrhosis: Secondary | ICD-10-CM | POA: Insufficient documentation

## 2012-11-03 DIAGNOSIS — E785 Hyperlipidemia, unspecified: Secondary | ICD-10-CM | POA: Insufficient documentation

## 2012-11-03 DIAGNOSIS — Z23 Encounter for immunization: Secondary | ICD-10-CM

## 2012-11-03 DIAGNOSIS — R109 Unspecified abdominal pain: Secondary | ICD-10-CM

## 2012-11-03 DIAGNOSIS — K745 Biliary cirrhosis, unspecified: Secondary | ICD-10-CM

## 2012-11-03 DIAGNOSIS — M810 Age-related osteoporosis without current pathological fracture: Secondary | ICD-10-CM | POA: Insufficient documentation

## 2012-11-03 DIAGNOSIS — I2510 Atherosclerotic heart disease of native coronary artery without angina pectoris: Secondary | ICD-10-CM | POA: Insufficient documentation

## 2012-11-03 DIAGNOSIS — Z8611 Personal history of tuberculosis: Secondary | ICD-10-CM | POA: Insufficient documentation

## 2012-11-03 DIAGNOSIS — H3530 Unspecified macular degeneration: Secondary | ICD-10-CM | POA: Insufficient documentation

## 2012-11-03 DIAGNOSIS — Z7901 Long term (current) use of anticoagulants: Secondary | ICD-10-CM | POA: Insufficient documentation

## 2012-11-03 DIAGNOSIS — M129 Arthropathy, unspecified: Secondary | ICD-10-CM

## 2012-11-03 DIAGNOSIS — I4891 Unspecified atrial fibrillation: Secondary | ICD-10-CM

## 2012-11-03 DIAGNOSIS — I7090 Unspecified atherosclerosis: Secondary | ICD-10-CM

## 2012-11-03 DIAGNOSIS — R0600 Dyspnea, unspecified: Secondary | ICD-10-CM | POA: Insufficient documentation

## 2012-11-03 DIAGNOSIS — M1990 Unspecified osteoarthritis, unspecified site: Secondary | ICD-10-CM | POA: Insufficient documentation

## 2012-11-03 DIAGNOSIS — K222 Esophageal obstruction: Secondary | ICD-10-CM | POA: Insufficient documentation

## 2012-11-03 DIAGNOSIS — E1122 Type 2 diabetes mellitus with diabetic chronic kidney disease: Secondary | ICD-10-CM | POA: Insufficient documentation

## 2012-11-03 DIAGNOSIS — H409 Unspecified glaucoma: Secondary | ICD-10-CM

## 2012-11-03 DIAGNOSIS — Z86718 Personal history of other venous thrombosis and embolism: Secondary | ICD-10-CM | POA: Insufficient documentation

## 2012-11-03 DIAGNOSIS — E119 Type 2 diabetes mellitus without complications: Secondary | ICD-10-CM | POA: Insufficient documentation

## 2012-11-03 DIAGNOSIS — R945 Abnormal results of liver function studies: Secondary | ICD-10-CM | POA: Insufficient documentation

## 2012-11-03 DIAGNOSIS — R7989 Other specified abnormal findings of blood chemistry: Secondary | ICD-10-CM | POA: Insufficient documentation

## 2012-11-03 DIAGNOSIS — K5900 Constipation, unspecified: Secondary | ICD-10-CM | POA: Insufficient documentation

## 2012-11-03 DIAGNOSIS — K5901 Slow transit constipation: Secondary | ICD-10-CM

## 2012-11-03 DIAGNOSIS — J309 Allergic rhinitis, unspecified: Secondary | ICD-10-CM | POA: Insufficient documentation

## 2012-11-03 LAB — POCT CBC
Granulocyte percent: 73.5 %G (ref 37–80)
HCT, POC: 39.3 % (ref 37.7–47.9)
Hemoglobin: 13.2 g/dL (ref 12.2–16.2)
Lymph, poc: 1.8 (ref 0.6–3.4)
MCH, POC: 30.4 pg (ref 27–31.2)
MCHC: 33.6 g/dL (ref 31.8–35.4)
MCV: 90.4 fL (ref 80–97)
MPV: 8.2 fL (ref 0–99.8)
POC Granulocyte: 5.9 (ref 2–6.9)
POC LYMPH PERCENT: 22.5 %L (ref 10–50)
Platelet Count, POC: 168 10*3/uL (ref 142–424)
RBC: 4.4 M/uL (ref 4.04–5.48)
RDW, POC: 15.9 %
WBC: 8 10*3/uL (ref 4.6–10.2)

## 2012-11-03 LAB — POCT INR: INR: 3.3

## 2012-11-03 MED ORDER — POLYETHYLENE GLYCOL 3350 17 GM/SCOOP PO POWD: 17.0000 g | Freq: Two times a day (BID) | ORAL | Status: DC | PRN

## 2012-11-03 NOTE — Progress Notes (Signed)
Patient ID: Stacey Brown, female   DOB: 1922-10-24, 79 y.o.   MRN: [id number] SUBJECTIVE: CC: Chief Complaint  Patient presents with  . Follow-up    stomach issues wants flu shot and protime checked and c/o bowels not moving and her right shoukder painfful     HPI: As above Has constipation frequently. No BM for 2 days. Having cramps and pain. Occasional dyspnea. No chest pain.  Right shoulder discomfort on ROM.    Past Medical History  Diagnosis Date  . Abnormal liver function test   . Arthritis   . Atrial fibrillation   . Chronic anticoagulation   . History of DVT of lower extremity   . Primary biliary cirrhosis   . Diabetes mellitus without complication   . Allergy     allergic  rhinitis  . Hyperlipidemia   . H/O TB (tuberculosis)   . Chronic kidney disease   . Glaucoma   . Esophageal stricture   . Macular degeneration   . Osteoporosis   . ASCVD (arteriosclerotic cardiovascular disease)    Past Surgical History  Procedure Laterality Date  . Abdominal hysterectomy    . Minor hemorrhoidectomy    . Left breast biopsy    . Cholecystectomy    . Herniography    . Right lung biopsy    . Right hip replacement    . Liver biopsy     History   Social History  . Marital Status: Widowed    Spouse Name: N/A    Number of Children: N/A  . Years of Education: N/A   Occupational History  . Retired     Disabled   Social History Main Topics  . Smoking status: Never Smoker   . Smokeless tobacco: Never Used  . Alcohol Use: No  . Drug Use: No  . Sexual Activity: Not on file   Other Topics Concern  . Not on file   Social History Narrative   Daily caffeine Use   Family History  Problem Relation Age of Onset  . Breast cancer Other     Neice  . Cirrhosis Other     Nephew  . Heart disease Mother   . Heart disease Brother   . Diabetes Mother     Brother,  . Kidney disease Cousin   . Colon cancer Neg Hx    Current Outpatient Prescriptions on File Prior to  Visit  Medication Sig Dispense Refill  . amLODipine (NORVASC) 5 MG tablet Take 5 mg by mouth daily.      . carboxymethylcellulose (REFRESH PLUS) 0.5 % SOLN 1 drop 3 (three) times daily as needed.      . cromolyn (OPTICROM) 4 % ophthalmic solution       . fexofenadine (ALLEGRA) 180 MG tablet Take 180 mg by mouth daily.      . furosemide (LASIX) 20 MG tablet TAKE ONE TABLET BY MOUTH ONE TIME DAILY  30 tablet  5  . glimepiride (AMARYL) 2 MG tablet TAKE 1 TABLET (2 MG TOTAL) BY MOUTH DAILY BEFORE BREAKFAST.  30 tablet  1  . nebivolol (BYSTOLIC) 5 MG tablet Take 5 mg by mouth daily.      Marland Kitchen omeprazole (PRILOSEC) 40 MG capsule Take 40 mg by mouth daily.      . rosuvastatin (CRESTOR) 20 MG tablet Take 20 mg by mouth daily.      . travoprost, benzalkonium, (TRAVATAN) 0.004 % ophthalmic solution Place 1 drop into both eyes at bedtime.      Marland Kitchen  ursodiol (ACTIGALL) 300 MG capsule TAKE ONE CAPSULE BY MOUTH TWICE DAILY  60 capsule  11  . Vitamin D, Ergocalciferol, (DRISDOL) 50000 UNITS CAPS Take 1 capsule (50,000 Units total) by mouth every 7 (seven) days.  4 capsule  0  . warfarin (COUMADIN) 3 MG tablet Take 1 and 1/2 tablets by mouth on sundays and thursdays and 1 tablet all other days or as directed by anticoagulation clinic  100 tablet  1   No current facility-administered medications on file prior to visit.   Allergies  Allergen Reactions  . Ace Inhibitors    Immunization History  Administered Date(s) Administered  . Tdap 05/19/2012   Prior to Admission medications   Medication Sig Start Date End Date Taking? Authorizing Provider  amLODipine (NORVASC) 5 MG tablet Take 5 mg by mouth daily.    Historical Provider, MD  carboxymethylcellulose (REFRESH PLUS) 0.5 % SOLN 1 drop 3 (three) times daily as needed.    Historical Provider, MD  cromolyn (OPTICROM) 4 % ophthalmic solution  04/22/12   Historical Provider, MD  fexofenadine (ALLEGRA) 180 MG tablet Take 180 mg by mouth daily.    Historical Provider,  MD  furosemide (LASIX) 20 MG tablet TAKE ONE TABLET BY MOUTH ONE TIME DAILY 10/08/12   Ernestina Penna, MD  glimepiride (AMARYL) 2 MG tablet TAKE 1 TABLET (2 MG TOTAL) BY MOUTH DAILY BEFORE BREAKFAST. 09/30/12   Ileana Ladd, MD  nebivolol (BYSTOLIC) 5 MG tablet Take 5 mg by mouth daily.    Historical Provider, MD  omeprazole (PRILOSEC) 40 MG capsule Take 40 mg by mouth daily.    Historical Provider, MD  rosuvastatin (CRESTOR) 20 MG tablet Take 20 mg by mouth daily.    Historical Provider, MD  travoprost, benzalkonium, (TRAVATAN) 0.004 % ophthalmic solution Place 1 drop into both eyes at bedtime.    Historical Provider, MD  ursodiol (ACTIGALL) 300 MG capsule TAKE ONE CAPSULE BY MOUTH TWICE DAILY 06/29/11   Louis Meckel, MD  Vitamin D, Ergocalciferol, (DRISDOL) 50000 UNITS CAPS Take 1 capsule (50,000 Units total) by mouth every 7 (seven) days. 08/22/12   Ernestina Penna, MD  warfarin (COUMADIN) 3 MG tablet Take 1 and 1/2 tablets by mouth on sundays and thursdays and 1 tablet all other days or as directed by anticoagulation clinic 10/26/12   Tammy Eckard, PHARMD     ROS: As above in the HPI. All other systems are stable or negative.  OBJECTIVE: APPEARANCE:  Patient in no acute distress.The patient appeared well nourished and normally developed. Acyanotic. Waist: VITAL SIGNS:BP 118/59  Pulse 58  Temp(Src) 97 F (36.1 C) (Oral)  Ht 5\' 4"  (1.626 m)  Wt 110 lb 12.8 oz (50.259 kg)  BMI 19.01 kg/m2   SKIN: warm and  Dry without overt rashes, tattoos and scars  HEAD and Neck: without JVD, Head and scalp: normal Eyes:No scleral icterus. Fundi normal, eye movements normal. Ears: Auricle normal, canal normal, Tympanic membranes normal, insufflation normal. Nose: normal Throat: normal Neck & thyroid: normal  CHEST & LUNGS: Chest wall: normal Lungs: Clear  CVS: Reveals the PMI to be normally located. Regular rhythm, First and Second Heart sounds are normal,  absence of murmurs, rubs or  gallops. Peripheral vasculature: Radial pulses: normal Dorsal pedis pulses: normal Posterior pulses: normal  ABDOMEN:  Appearance: normal Benign, no organomegaly, no masses, no Abdominal Aortic enlargement. No Guarding , no rebound. No Bruits. Bowel sounds: normal  RECTAL: N/A GU: N/A  EXTREMETIES: nonedematous.  MUSCULOSKELETAL:  Spine: normal Joints: intact  NEUROLOGIC: oriented to time,place and person; nonfocal. Strength is normal Sensory is normal Reflexes are normal Cranial Nerves are normal.  ASSESSMENT: Type II or unspecified type diabetes mellitus without mention of complication, not stated as uncontrolled  Abdominal pain, unspecified site - Plan: POCT CBC, DG Abd 2 Views, polyethylene glycol powder (GLYCOLAX/MIRALAX) powder  Dyspnea - Plan: EKG 12-Lead  Unspecified constipation  Arthritis  ASCVD (arteriosclerotic cardiovascular disease)  A-fib - Plan: POCT INR  Abnormal liver function test  Chronic anticoagulation  Diabetes mellitus without complication  Esophageal stricture  Glaucoma  Osteoporosis  Primary biliary cirrhosis  Constipation, slow transit - Plan: polyethylene glycol powder (GLYCOLAX/MIRALAX) powder   PLAN:  Orders Placed This Encounter  Procedures  . DG Abd 2 Views    Standing Status: Future     Number of Occurrences: 1     Standing Expiration Date: 01/03/2014    Order Specific Question:  Reason for Exam (SYMPTOM  OR DIAGNOSIS REQUIRED)    Answer:  abdominal pain    Order Specific Question:  Preferred imaging location?    Answer:  Internal  . POCT CBC  . EKG 12-Lead    [facility] reading (PRIMARY) by  Dr. Modesto Charon   : Primary Biliary Cirrhosis: post/op changes. Lots of stools. Await stat over read.                   EKG : abnormal: controlled rate; no new acute findings  CLINICAL DATA: Abdominal pain  EXAM:  ABDOMEN - 2 VIEW  COMPARISON: CT 05/25/2007  FINDINGS:  The bowel gas pattern is normal. There is no evidence of  free air.  No radio-opaque calculi or other significant radiographic  abnormality is seen. Splenic arterial and aortic calcification and  ectasia noted. Cholecystectomy clips are noted. Right total hip  arthroplasty partly visualized. Moderate stool volume noted  throughout nondilated colon. Right upper quadrant calcifications  most likely reflect material within colonic diverticuli.  Hyperinflation of the lung bases may indicate emphysema,  incompletely evaluated. Lumbar spine degenerative change with mild  leftward curvature centered at L2 noted. Left inferior pubic ramus  chronic deformity is identified.  IMPRESSION:  Moderate stool volume throughout nondilated colon. Nonobstructive  bowel gas pattern.  Electronically Signed  By: Christiana Pellant M.D.  On: 11/03/2012 16:19  Results for orders placed in visit on 11/03/12  POCT INR      Result Value Range   INR 3.3    POCT CBC      Result Value Range   WBC 8.0  4.6 - 10.2 K/uL   Lymph, poc 1.8  0.6 - 3.4   POC LYMPH PERCENT 22.5  10 - 50 %L   POC Granulocyte 5.9  2 - 6.9   Granulocyte percent 73.5  37 - 80 %G   RBC 4.4  4.04 - 5.48 M/uL   Hemoglobin 13.2  12.2 - 16.2 g/dL   HCT, POC 16.1  09.6 - 47.9 %   MCV 90.4  80 - 97 fL   MCH, POC 30.4  27 - 31.2 pg   MCHC 33.6  31.8 - 35.4 g/dL   RDW, POC 04.5     Platelet Count, POC 168.0  142 - 424 K/uL   MPV 8.2  0 - 99.8 fL    Meds ordered this encounter  Medications  . warfarin (COUMADIN) 3 MG tablet    Sig: Take 1 and 1/2 tablets by mouth on sundays and 1 tablet all other  days or as directed by anticoagulation clinic    Dispense:  100 tablet    Refill:  1  . polyethylene glycol powder (GLYCOLAX/MIRALAX) powder    Sig: Take 17 g by mouth 2 (two) times daily as needed.    Dispense:  3350 g    Refill:  1   Medications Discontinued During This Encounter  Medication Reason  . warfarin (COUMADIN) 3 MG tablet Reorder   Return in about 2 weeks (around 11/17/2012) for Recheck  medical problems.  Shannan Garfinkel P. Modesto Charon, M.D.

## 2012-11-17 ENCOUNTER — Ambulatory Visit: Payer: Medicare Other | Admitting: Family Medicine

## 2012-11-30 ENCOUNTER — Other Ambulatory Visit: Payer: Self-pay | Admitting: Family Medicine

## 2012-12-06 ENCOUNTER — Ambulatory Visit (INDEPENDENT_AMBULATORY_CARE_PROVIDER_SITE_OTHER): Payer: Medicare Other | Admitting: Family Medicine

## 2012-12-06 ENCOUNTER — Encounter: Payer: Self-pay | Admitting: Family Medicine

## 2012-12-06 VITALS — BP 137/70 | HR 73 | Temp 97.1°F | Ht 63.0 in | Wt 110.2 lb

## 2012-12-06 DIAGNOSIS — E785 Hyperlipidemia, unspecified: Secondary | ICD-10-CM

## 2012-12-06 DIAGNOSIS — H3530 Unspecified macular degeneration: Secondary | ICD-10-CM

## 2012-12-06 DIAGNOSIS — I7090 Unspecified atherosclerosis: Secondary | ICD-10-CM

## 2012-12-06 DIAGNOSIS — K5900 Constipation, unspecified: Secondary | ICD-10-CM

## 2012-12-06 DIAGNOSIS — E119 Type 2 diabetes mellitus without complications: Secondary | ICD-10-CM

## 2012-12-06 DIAGNOSIS — M129 Arthropathy, unspecified: Secondary | ICD-10-CM

## 2012-12-06 DIAGNOSIS — L570 Actinic keratosis: Secondary | ICD-10-CM

## 2012-12-06 DIAGNOSIS — I2510 Atherosclerotic heart disease of native coronary artery without angina pectoris: Secondary | ICD-10-CM

## 2012-12-06 DIAGNOSIS — I4891 Unspecified atrial fibrillation: Secondary | ICD-10-CM

## 2012-12-06 DIAGNOSIS — N189 Chronic kidney disease, unspecified: Secondary | ICD-10-CM

## 2012-12-06 DIAGNOSIS — K743 Primary biliary cirrhosis: Secondary | ICD-10-CM

## 2012-12-06 DIAGNOSIS — K745 Biliary cirrhosis, unspecified: Secondary | ICD-10-CM

## 2012-12-06 DIAGNOSIS — Z7901 Long term (current) use of anticoagulants: Secondary | ICD-10-CM

## 2012-12-06 DIAGNOSIS — E559 Vitamin D deficiency, unspecified: Secondary | ICD-10-CM

## 2012-12-06 DIAGNOSIS — M1990 Unspecified osteoarthritis, unspecified site: Secondary | ICD-10-CM

## 2012-12-06 LAB — POCT GLYCOSYLATED HEMOGLOBIN (HGB A1C): Hemoglobin A1C: 6.1

## 2012-12-06 NOTE — Progress Notes (Signed)
Patient ID: Stacey Brown, female   DOB: Jul 24, 1922, 79 y.o.   MRN: [id number] SUBJECTIVE: CC: Chief Complaint  Patient presents with  . Follow-up    2 week follow up  pro time  ? need prolia    HPI:  Past Medical History  Diagnosis Date  . Abnormal liver function test   . Atrial fibrillation   . Chronic anticoagulation   . History of DVT of lower extremity   . Primary biliary cirrhosis   . H/O TB (tuberculosis)   . Esophageal stricture   . Macular degeneration   . ASCVD (arteriosclerotic cardiovascular disease)   . Allergy     allergic  rhinitis  . Arthritis   . Diabetes mellitus without complication   . Glaucoma   . Hyperlipidemia   . Chronic kidney disease   . Osteoporosis    Past Surgical History  Procedure Laterality Date  . Abdominal hysterectomy    . Minor hemorrhoidectomy    . Left breast biopsy    . Cholecystectomy    . Herniography    . Right lung biopsy    . Right hip replacement    . Liver biopsy     History   Social History  . Marital Status: Widowed    Spouse Name: N/A    Number of Children: N/A  . Years of Education: N/A   Occupational History  . Retired     Disabled   Social History Main Topics  . Smoking status: Never Smoker   . Smokeless tobacco: Never Used  . Alcohol Use: No  . Drug Use: No  . Sexual Activity: Not on file   Other Topics Concern  . Not on file   Social History Narrative   Daily caffeine Use   Family History  Problem Relation Age of Onset  . Breast cancer Other     Neice  . Cirrhosis Other     Nephew  . Heart disease Mother   . Heart disease Brother   . Diabetes Mother     Brother,  . Kidney disease Cousin   . Colon cancer Neg Hx    Current Outpatient Prescriptions on File Prior to Visit  Medication Sig Dispense Refill  . amLODipine (NORVASC) 5 MG tablet Take 5 mg by mouth daily.      . carboxymethylcellulose (REFRESH PLUS) 0.5 % SOLN 1 drop 3 (three) times daily as needed.      . cromolyn (OPTICROM)  4 % ophthalmic solution       . fexofenadine (ALLEGRA) 180 MG tablet Take 180 mg by mouth daily.      . furosemide (LASIX) 20 MG tablet TAKE ONE TABLET BY MOUTH ONE TIME DAILY  30 tablet  5  . glimepiride (AMARYL) 2 MG tablet TAKE ONE TABLET BY MOUTH ONE TIME DAILY BEFORE BREAKFAST  30 tablet  0  . nebivolol (BYSTOLIC) 5 MG tablet Take 5 mg by mouth daily.      Marland Kitchen omeprazole (PRILOSEC) 40 MG capsule Take 40 mg by mouth daily.      . polyethylene glycol powder (GLYCOLAX/MIRALAX) powder Take 17 g by mouth 2 (two) times daily as needed.  3350 g  1  . rosuvastatin (CRESTOR) 20 MG tablet Take 20 mg by mouth daily.      . travoprost, benzalkonium, (TRAVATAN) 0.004 % ophthalmic solution Place 1 drop into both eyes at bedtime.      . ursodiol (ACTIGALL) 300 MG capsule TAKE ONE CAPSULE BY MOUTH TWICE DAILY  60 capsule  11  . Vitamin D, Ergocalciferol, (DRISDOL) 50000 UNITS CAPS Take 1 capsule (50,000 Units total) by mouth every 7 (seven) days.  4 capsule  0  . warfarin (COUMADIN) 3 MG tablet Take 1 and 1/2 tablets by mouth on sundays and 1 tablet all other days or as directed by anticoagulation clinic  100 tablet  1   No current facility-administered medications on file prior to visit.   Allergies  Allergen Reactions  . Dexilant [Dexlansoprazole]     Throat swells   . Ace Inhibitors    Immunization History  Administered Date(s) Administered  . Influenza,inj,Quad PF,36+ Mos 11/03/2012  . Tdap 05/19/2012   Prior to Admission medications   Medication Sig Start Date End Date Taking? Authorizing Provider  amLODipine (NORVASC) 5 MG tablet Take 5 mg by mouth daily.    Historical Provider, MD  carboxymethylcellulose (REFRESH PLUS) 0.5 % SOLN 1 drop 3 (three) times daily as needed.    Historical Provider, MD  cromolyn (OPTICROM) 4 % ophthalmic solution  04/22/12   Historical Provider, MD  fexofenadine (ALLEGRA) 180 MG tablet Take 180 mg by mouth daily.    Historical Provider, MD  furosemide (LASIX) 20 MG  tablet TAKE ONE TABLET BY MOUTH ONE TIME DAILY 10/08/12   Ernestina Penna, MD  glimepiride (AMARYL) 2 MG tablet TAKE ONE TABLET BY MOUTH ONE TIME DAILY BEFORE BREAKFAST 11/30/12   Ileana Ladd, MD  nebivolol (BYSTOLIC) 5 MG tablet Take 5 mg by mouth daily.    Historical Provider, MD  omeprazole (PRILOSEC) 40 MG capsule Take 40 mg by mouth daily.    Historical Provider, MD  polyethylene glycol powder (GLYCOLAX/MIRALAX) powder Take 17 g by mouth 2 (two) times daily as needed. 11/03/12   Ileana Ladd, MD  rosuvastatin (CRESTOR) 20 MG tablet Take 20 mg by mouth daily.    Historical Provider, MD  travoprost, benzalkonium, (TRAVATAN) 0.004 % ophthalmic solution Place 1 drop into both eyes at bedtime.    Historical Provider, MD  ursodiol (ACTIGALL) 300 MG capsule TAKE ONE CAPSULE BY MOUTH TWICE DAILY 06/29/11   Louis Meckel, MD  Vitamin D, Ergocalciferol, (DRISDOL) 50000 UNITS CAPS Take 1 capsule (50,000 Units total) by mouth every 7 (seven) days. 08/22/12   Ernestina Penna, MD  warfarin (COUMADIN) 3 MG tablet Take 1 and 1/2 tablets by mouth on sundays and 1 tablet all other days or as directed by anticoagulation clinic 11/03/12   Ileana Ladd, MD     ROS: As above in the HPI. All other systems are stable or negative.  OBJECTIVE: APPEARANCE:  Patient in no acute distress.The patient appeared well nourished and normally developed. Acyanotic. Waist: VITAL SIGNS:BP 137/70  Pulse 73  Temp(Src) 97.1 F (36.2 C) (Oral)  Ht 5\' 3"  (1.6 m)  Wt 110 lb 3.2 oz (49.986 kg)  BMI 19.53 kg/m2  Elderly frail WF  SKIN: warm and  Dry  Actinic keratosis lesion on the right cheek. This was treated with Liquid N2 cryotherapy.   HEAD and Neck: without JVD, Head and scalp: normal Eyes:No scleral icterus, eye movements normal. Ears: Auricle normal, canal normal, Tympanic membranes normal, insufflation normal. Nose: normal Throat: normal Neck & thyroid: normal  CHEST & LUNGS: Chest wall: normal Lungs:  Clear  CVS: Reveals the PMI to be normally located. Regular rhythm, First and Second Heart sounds are normal,  absence of murmurs, rubs or gallops.  ABDOMEN:  Appearance: normal Benign, no organomegaly, no masses, no  Abdominal Aortic enlargement. No Guarding , no rebound. No Bruits. Bowel sounds: normal  RECTAL: N/A GU: N/A  EXTREMETIES: nonedematous.  MUSCULOSKELETAL:  Spine: kyphosis  NEUROLOGIC: oriented to time,place and person; nonfocal.   ASSESSMENT: A-fib - Plan: POCT INR  Diabetes mellitus without complication - Plan: POCT glycosylated hemoglobin (Hb A1C), CMP14+EGFR  Primary biliary cirrhosis  ASCVD (arteriosclerotic cardiovascular disease) - Plan: CMP14+EGFR, NMR, lipoprofile  Arthritis  Chronic anticoagulation  Chronic kidney disease  Macular degeneration  HLD (hyperlipidemia) - Plan: NMR, lipoprofile  PBC  Unspecified constipation  Unspecified vitamin D deficiency - Plan: Vit D  25 hydroxy (rtn osteoporosis monitoring)  Actinic keratosis of right cheek  PLAN:  Orders Placed This Encounter  Procedures  . CMP14+EGFR  . NMR, lipoprofile  . Vit D  25 hydroxy (rtn osteoporosis monitoring)  . POCT glycosylated hemoglobin (Hb A1C)   Results for orders placed in visit on 12/06/12  POCT INR      Result Value Range   INR 2.5    POCT GLYCOSYLATED HEMOGLOBIN (HGB A1C)      Result Value Range   Hemoglobin A1C 6.1%     Same dose of coumadin. No orders of the defined types were placed in this encounter.   There are no discontinued medications. Return in about 3 months (around 03/08/2013) for with Tammy for Prolia  shot and Protime in 4 weeks and in 3 months Recheck medical problems.  Shan Valdes P. Modesto Charon, M.D.

## 2012-12-08 LAB — NMR, LIPOPROFILE

## 2012-12-08 LAB — CMP14+EGFR
ALT: 10 IU/L (ref 0–32)
AST: 21 IU/L (ref 0–40)
Albumin/Globulin Ratio: 2 (ref 1.1–2.5)
Albumin: 4.5 g/dL (ref 3.5–4.7)
Alkaline Phosphatase: 91 IU/L (ref 39–117)
BUN/Creatinine Ratio: 22 (ref 11–26)
BUN: 24 mg/dL (ref 8–27)
CO2: 28 mmol/L (ref 18–29)
Calcium: 7.7 mg/dL — ABNORMAL LOW (ref 8.6–10.2)
Chloride: 99 mmol/L (ref 97–108)
Creatinine, Ser: 1.07 mg/dL — ABNORMAL HIGH (ref 0.57–1.00)
GFR calc Af Amer: 54 mL/min/{1.73_m2} — ABNORMAL LOW (ref >59)
GFR calc non Af Amer: 47 mL/min/{1.73_m2} — ABNORMAL LOW (ref >59)
Globulin, Total: 2.3 g/dL (ref 1.5–4.5)
Glucose: 162 mg/dL — ABNORMAL HIGH (ref 65–99)
Potassium: 5.3 mmol/L — ABNORMAL HIGH (ref 3.5–5.2)
Sodium: 143 mmol/L (ref 134–144)
Total Bilirubin: 0.5 mg/dL (ref 0.0–1.2)
Total Protein: 6.8 g/dL (ref 6.0–8.5)

## 2012-12-08 LAB — VITAMIN D 25 HYDROXY (VIT D DEFICIENCY, FRACTURES): Vit D, 25-Hydroxy: 62 ng/mL (ref 30.0–100.0)

## 2012-12-09 LAB — SPECIMEN STATUS REPORT

## 2012-12-11 ENCOUNTER — Other Ambulatory Visit: Payer: Self-pay | Admitting: Family Medicine

## 2012-12-11 NOTE — Progress Notes (Signed)
Quick Note:  Call Patient Labs abnormal: Calcium is low? Need to repeat. Labs need to check why the lipomed was cancelled. The rest was acceptable.  Recommendations: Repeat calcium with PTH. Lab to check why the lipomed was cancelled?   ______

## 2012-12-12 LAB — LIPID PANEL WITH LDL/HDL RATIO
Cholesterol, Total: 140 mg/dL (ref 100–199)
HDL: 37 mg/dL — ABNORMAL LOW (ref >39)
LDL Calculated: 54 mg/dL (ref 0–99)
LDl/HDL Ratio: 1.5 ratio units (ref 0.0–3.2)
Triglycerides: 244 mg/dL — ABNORMAL HIGH (ref 0–149)
VLDL Cholesterol Cal: 49 mg/dL — ABNORMAL HIGH (ref 5–40)

## 2012-12-12 LAB — SPECIMEN STATUS REPORT

## 2012-12-13 NOTE — Progress Notes (Signed)
Quick Note:  Call Patient Labs abnormal: Triglycerides are too high.  Recommendations: Reduce simple Carbs and sugars. Avoid grapes , pineapples, white potatoes , white bread, white rice and pasta. Start on omega-3 fish oil 2 gm twice a Day to reduce the triglycerides.  There was a message before for the patient to come in to recheck the calcium.   ______

## 2012-12-21 ENCOUNTER — Other Ambulatory Visit: Payer: Self-pay | Admitting: *Deleted

## 2012-12-21 NOTE — Progress Notes (Signed)
Patient will return for calcium and PTH.

## 2012-12-23 ENCOUNTER — Other Ambulatory Visit (INDEPENDENT_AMBULATORY_CARE_PROVIDER_SITE_OTHER): Payer: Medicare Other

## 2012-12-23 NOTE — Progress Notes (Signed)
Pt came in for labs only 

## 2012-12-28 LAB — PTH, INTACT AND CALCIUM: PTH: 27 pg/mL (ref 15–65)

## 2012-12-31 ENCOUNTER — Other Ambulatory Visit: Payer: Self-pay | Admitting: Family Medicine

## 2013-01-09 ENCOUNTER — Encounter (INDEPENDENT_AMBULATORY_CARE_PROVIDER_SITE_OTHER): Payer: Self-pay

## 2013-01-09 ENCOUNTER — Ambulatory Visit (INDEPENDENT_AMBULATORY_CARE_PROVIDER_SITE_OTHER): Payer: Medicare Other | Admitting: Pharmacist

## 2013-01-09 DIAGNOSIS — I4891 Unspecified atrial fibrillation: Secondary | ICD-10-CM

## 2013-01-09 DIAGNOSIS — E875 Hyperkalemia: Secondary | ICD-10-CM

## 2013-01-09 NOTE — Progress Notes (Signed)
CC: recheck protime and prolia injection due  Patient has osteoporosis and is due Prolia injection however she had an elevated potassium and hypocalcemia 12/06/12.  Since prolia can decrease calcium further need to recheck calcium and make sure its corrected prior to prolia administered.   BMET ordered today - pending.

## 2013-01-09 NOTE — Patient Instructions (Addendum)
Anticoagulation Dose Instructions as of 01/09/2013     Stacey Brown Tue Wed Thu Fri Sat   New Dose 4.5 mg 3 mg 3 mg 3 mg 4.5 mg 3 mg 3 mg    Description       Continue current dose of 1 and 1/2 tablets of warfarin on Sunday and thursdays and 1 tablet all other days.        INR was 2.0 today   Try to find liquid fish oil since finding capsules difficult to swallow.   Also can try Allegra (fexofenidine) liquid / children's strength 30mg /67mL - would need to take 60 milliliters to equal 180mg  daily  Prilosec capsules can also be opened and contents sprinkled on applesauce and applesauce swallow (do not chew).

## 2013-01-10 LAB — BMP8+EGFR
BUN/Creatinine Ratio: 20 (ref 11–26)
BUN: 23 mg/dL (ref 8–27)
Calcium: 9 mg/dL (ref 8.6–10.2)
Chloride: 105 mmol/L (ref 97–108)
Creatinine, Ser: 1.14 mg/dL — ABNORMAL HIGH (ref 0.57–1.00)
GFR calc non Af Amer: 43 mL/min/{1.73_m2} — ABNORMAL LOW (ref >59)
Sodium: 146 mmol/L — ABNORMAL HIGH (ref 134–144)

## 2013-01-11 ENCOUNTER — Telehealth: Payer: Self-pay | Admitting: Pharmacist

## 2013-01-11 NOTE — Telephone Encounter (Signed)
Pt's daughter [name] called with lab results.  Calcium was back to normal - will give prolia at next visit.

## 2013-01-23 ENCOUNTER — Telehealth: Payer: Self-pay | Admitting: *Deleted

## 2013-01-23 NOTE — Telephone Encounter (Signed)
Daughter notified 

## 2013-02-13 ENCOUNTER — Ambulatory Visit (INDEPENDENT_AMBULATORY_CARE_PROVIDER_SITE_OTHER): Payer: Medicare Other | Admitting: Pharmacist

## 2013-02-13 DIAGNOSIS — I4891 Unspecified atrial fibrillation: Secondary | ICD-10-CM

## 2013-02-13 DIAGNOSIS — M810 Age-related osteoporosis without current pathological fracture: Secondary | ICD-10-CM

## 2013-02-13 LAB — POCT INR: INR: 2.8

## 2013-02-13 MED ORDER — DENOSUMAB 60 MG/ML ~~LOC~~ SOLN
60.0000 mg | Freq: Once | SUBCUTANEOUS | Status: AC
  Administered 2013-02-13: 60 mg via SUBCUTANEOUS

## 2013-02-13 NOTE — Patient Instructions (Signed)
Anticoagulation Dose Instructions as of 02/13/2013     Stacey Brown Tue Wed Thu Fri Sat   New Dose 4.5 mg 3 mg 3 mg 3 mg 4.5 mg 3 mg 3 mg    Description       Continue current warfarin dose of 1 and 1/2 tablets of warfarin on Sunday and thursdays and 1 tablet all other days.        INR was 2.8 today

## 2013-02-13 NOTE — Progress Notes (Signed)
[facility]osis [facility]    HPI: Patient with osteoporosis.  She started Prolia injections about 11/2011. Last was 05/2012 (she is past due Prolia but she does have to rely on daughter for transportation and we did not have Prolia when she was last in office)  Back Pain?  Yes       Kyphosis?  Yes Prior fracture?  Yes - hip in 1960's  Med(s) previously tried for Osteoporosis/Osteopenia:  none                                                             Last Vitamin D Result:  62 Last GFR Result:  47        DEXA Results Date of Test T-Score for AP Spine L1-L4 T-Score for Total Left Hip T-Score for Total Right Hip  09/23/2011 -1.1 -2.9 --                  Assessment: osteoporosis  Recommendations: 1.  Continue prolia 60mg  SQ q6 months - administered today 2.  recommend calcium 1200mg  daily through supplementation or diet.  3.  recommend weight bearing exercise - 30 minutes at least 4 days  per week.   4.  Counseled and educated about fall risk and prevention.  Recheck DEXA:  09/2013  Time spent counseling patient:  10 minutes  Cherre Robins, PharmD, CPP

## 2013-02-16 ENCOUNTER — Other Ambulatory Visit: Payer: Self-pay | Admitting: Pharmacist

## 2013-02-16 DIAGNOSIS — M810 Age-related osteoporosis without current pathological fracture: Secondary | ICD-10-CM

## 2013-03-02 ENCOUNTER — Other Ambulatory Visit: Payer: Self-pay | Admitting: Pharmacist

## 2013-03-09 ENCOUNTER — Encounter: Payer: Self-pay | Admitting: Family Medicine

## 2013-03-09 ENCOUNTER — Ambulatory Visit (INDEPENDENT_AMBULATORY_CARE_PROVIDER_SITE_OTHER): Payer: Medicare Other | Admitting: Family Medicine

## 2013-03-09 VITALS — BP 130/61 | HR 57 | Temp 98.7°F | Ht 63.0 in | Wt 112.0 lb

## 2013-03-09 DIAGNOSIS — L02619 Cutaneous abscess of unspecified foot: Secondary | ICD-10-CM

## 2013-03-09 DIAGNOSIS — N189 Chronic kidney disease, unspecified: Secondary | ICD-10-CM

## 2013-03-09 DIAGNOSIS — H3530 Unspecified macular degeneration: Secondary | ICD-10-CM

## 2013-03-09 DIAGNOSIS — I4891 Unspecified atrial fibrillation: Secondary | ICD-10-CM

## 2013-03-09 DIAGNOSIS — M810 Age-related osteoporosis without current pathological fracture: Secondary | ICD-10-CM

## 2013-03-09 DIAGNOSIS — H409 Unspecified glaucoma: Secondary | ICD-10-CM

## 2013-03-09 DIAGNOSIS — L03039 Cellulitis of unspecified toe: Secondary | ICD-10-CM

## 2013-03-09 DIAGNOSIS — L02611 Cutaneous abscess of right foot: Secondary | ICD-10-CM

## 2013-03-09 DIAGNOSIS — I7090 Unspecified atherosclerosis: Secondary | ICD-10-CM

## 2013-03-09 DIAGNOSIS — M1990 Unspecified osteoarthritis, unspecified site: Secondary | ICD-10-CM

## 2013-03-09 DIAGNOSIS — R945 Abnormal results of liver function studies: Secondary | ICD-10-CM

## 2013-03-09 DIAGNOSIS — Z7901 Long term (current) use of anticoagulants: Secondary | ICD-10-CM

## 2013-03-09 DIAGNOSIS — I2510 Atherosclerotic heart disease of native coronary artery without angina pectoris: Secondary | ICD-10-CM

## 2013-03-09 DIAGNOSIS — K222 Esophageal obstruction: Secondary | ICD-10-CM

## 2013-03-09 DIAGNOSIS — E785 Hyperlipidemia, unspecified: Secondary | ICD-10-CM

## 2013-03-09 DIAGNOSIS — K743 Primary biliary cirrhosis: Secondary | ICD-10-CM

## 2013-03-09 DIAGNOSIS — K745 Biliary cirrhosis, unspecified: Secondary | ICD-10-CM

## 2013-03-09 DIAGNOSIS — E119 Type 2 diabetes mellitus without complications: Secondary | ICD-10-CM

## 2013-03-09 DIAGNOSIS — E559 Vitamin D deficiency, unspecified: Secondary | ICD-10-CM

## 2013-03-09 DIAGNOSIS — M129 Arthropathy, unspecified: Secondary | ICD-10-CM

## 2013-03-09 DIAGNOSIS — R7989 Other specified abnormal findings of blood chemistry: Secondary | ICD-10-CM

## 2013-03-09 DIAGNOSIS — R1319 Other dysphagia: Secondary | ICD-10-CM

## 2013-03-09 LAB — POCT GLYCOSYLATED HEMOGLOBIN (HGB A1C): Hemoglobin A1C: 6.5

## 2013-03-09 MED ORDER — SULFAMETHOXAZOLE-TMP DS 800-160 MG PO TABS
1.0000 | ORAL_TABLET | Freq: Two times a day (BID) | ORAL | Status: DC
Start: 2013-03-09 — End: 2013-03-14

## 2013-03-09 NOTE — Progress Notes (Signed)
Patient ID: Stacey Brown, female   DOB: 1922/07/12, 80 y.o.   MRN: [id number] SUBJECTIVE: CC: Chief Complaint  Patient presents with  . Follow-up    3 month follow up    HPI: Here for follow up.  Past Medical History  Diagnosis Date  . Abnormal liver function test   . Atrial fibrillation   . Chronic anticoagulation   . History of DVT of lower extremity   . Primary biliary cirrhosis   . H/O TB (tuberculosis)   . Esophageal stricture   . Macular degeneration   . ASCVD (arteriosclerotic cardiovascular disease)   . Allergy     allergic  rhinitis  . Arthritis   . Diabetes mellitus without complication   . Glaucoma   . Hyperlipidemia   . Chronic kidney disease   . Osteoporosis    Past Surgical History  Procedure Laterality Date  . Abdominal hysterectomy    . Minor hemorrhoidectomy    . Left breast biopsy    . Cholecystectomy    . Herniography    . Right lung biopsy    . Right hip replacement    . Liver biopsy     History   Social History  . Marital Status: Widowed    Spouse Name: N/A    Number of Children: N/A  . Years of Education: N/A   Occupational History  . Retired     Disabled   Social History Main Topics  . Smoking status: Never Smoker   . Smokeless tobacco: Never Used  . Alcohol Use: No  . Drug Use: No  . Sexual Activity: Not on file   Other Topics Concern  . Not on file   Social History Narrative   Daily caffeine Use   Family History  Problem Relation Age of Onset  . Breast cancer Other     Neice  . Cirrhosis Other     Nephew  . Heart disease Mother   . Heart disease Brother   . Diabetes Mother     Brother,  . Kidney disease Cousin   . Colon cancer Neg Hx    Current Outpatient Prescriptions on File Prior to Visit  Medication Sig Dispense Refill  . amLODipine (NORVASC) 5 MG tablet Take 5 mg by mouth daily.      . carboxymethylcellulose (REFRESH PLUS) 0.5 % SOLN 1 drop 3 (three) times daily as needed.      . cromolyn (OPTICROM) 4 %  ophthalmic solution       . furosemide (LASIX) 20 MG tablet TAKE ONE TABLET BY MOUTH ONE TIME DAILY  30 tablet  5  . glimepiride (AMARYL) 2 MG tablet TAKE ONE TABLET BY MOUTH ONE TIME DAILY BEFORE BREAKFAST  30 tablet  2  . nebivolol (BYSTOLIC) 5 MG tablet Take 5 mg by mouth daily.      Marland Kitchen omeprazole (PRILOSEC) 40 MG capsule Take 40 mg by mouth daily.      . polyethylene glycol powder (GLYCOLAX/MIRALAX) powder Take 17 g by mouth 2 (two) times daily as needed.  3350 g  1  . rosuvastatin (CRESTOR) 20 MG tablet Take 20 mg by mouth daily.      . travoprost, benzalkonium, (TRAVATAN) 0.004 % ophthalmic solution Place 1 drop into both eyes at bedtime.      . ursodiol (ACTIGALL) 300 MG capsule TAKE ONE CAPSULE BY MOUTH TWICE DAILY  60 capsule  11  . Vitamin D, Ergocalciferol, (DRISDOL) 50000 UNITS CAPS Take 1 capsule (50,000 Units total)  by mouth every 7 (seven) days.  4 capsule  0  . warfarin (COUMADIN) 3 MG tablet Take 1 and 1/2 tablets by mouth on sundays and 1 tablet all other days or as directed by anticoagulation clinic  100 tablet  1  . warfarin (COUMADIN) 3 MG tablet TAKE 1 AND 1/2 TABLETS BY MOUTH ON SUNDAYS AND THURSDAYS AND 1 TABLETALL OTHER DAYS OR AS DIRECTED BY  100 tablet  0  . fexofenadine (ALLEGRA) 180 MG tablet Take 180 mg by mouth daily.       No current facility-administered medications on file prior to visit.   Allergies  Allergen Reactions  . Dexilant [Dexlansoprazole]     Throat swells   . Ace Inhibitors    Immunization History  Administered Date(s) Administered  . Influenza,inj,Quad PF,36+ Mos 11/03/2012  . Tdap 05/19/2012   Prior to Admission medications   Medication Sig Start Date End Date Taking? Authorizing Provider  amLODipine (NORVASC) 5 MG tablet Take 5 mg by mouth daily.    Historical Provider, MD  carboxymethylcellulose (REFRESH PLUS) 0.5 % SOLN 1 drop 3 (three) times daily as needed.    Historical Provider, MD  cromolyn (OPTICROM) 4 % ophthalmic solution   04/22/12   Historical Provider, MD  fexofenadine (ALLEGRA) 180 MG tablet Take 180 mg by mouth daily.    Historical Provider, MD  furosemide (LASIX) 20 MG tablet TAKE ONE TABLET BY MOUTH ONE TIME DAILY 10/08/12   Chipper Herb, MD  glimepiride (AMARYL) 2 MG tablet TAKE ONE TABLET BY MOUTH ONE TIME DAILY BEFORE BREAKFAST 12/31/12   Vernie Shanks, MD  nebivolol (BYSTOLIC) 5 MG tablet Take 5 mg by mouth daily.    Historical Provider, MD  omeprazole (PRILOSEC) 40 MG capsule Take 40 mg by mouth daily.    Historical Provider, MD  polyethylene glycol powder (GLYCOLAX/MIRALAX) powder Take 17 g by mouth 2 (two) times daily as needed. 11/03/12   Vernie Shanks, MD  rosuvastatin (CRESTOR) 20 MG tablet Take 20 mg by mouth daily.    Historical Provider, MD  travoprost, benzalkonium, (TRAVATAN) 0.004 % ophthalmic solution Place 1 drop into both eyes at bedtime.    Historical Provider, MD  ursodiol (ACTIGALL) 300 MG capsule TAKE ONE CAPSULE BY MOUTH TWICE DAILY 06/29/11   Inda Castle, MD  Vitamin D, Ergocalciferol, (DRISDOL) 50000 UNITS CAPS Take 1 capsule (50,000 Units total) by mouth every 7 (seven) days. 08/22/12   Chipper Herb, MD  warfarin (COUMADIN) 3 MG tablet Take 1 and 1/2 tablets by mouth on sundays and 1 tablet all other days or as directed by anticoagulation clinic 11/03/12   Vernie Shanks, MD  warfarin (COUMADIN) 3 MG tablet TAKE 1 AND 1/2 TABLETS BY MOUTH ON SUNDAYS AND THURSDAYS AND 1 TABLETALL OTHER DAYS OR AS DIRECTED BY 03/02/13   Chipper Herb, MD     ROS: As above in the HPI. All other systems are stable or negative.  OBJECTIVE: APPEARANCE:  Patient in no acute distress.The patient appeared well nourished and normally developed. Acyanotic. Waist: VITAL SIGNS:BP 130/61  Pulse 57  Temp(Src) 98.7 F (37.1 C) (Oral)  Ht 5\' 3"  (1.6 m)  Wt 112 lb (50.803 kg)  BMI 19.84 kg/m2 WF elderly kyphotic  SKIN: warm and  Dry without overt rashes, tattoos and scars  HEAD and Neck: without  JVD, Head and scalp: normal Eyes:No scleral icterus. Fundi normal, eye movements normal. Ears: Auricle normal, canal normal, Tympanic membranes normal, insufflation normal. Nose:  normal Throat: normal Neck & thyroid: normal  CHEST & LUNGS: Chest wall: normal Lungs: Clear  CVS: Reveals the PMI to be normally located. Regular rhythm, First and Second Heart sounds are normal,  absence of murmurs, rubs or gallops. Peripheral vasculature: Radial pulses: normal Dorsal pedis pulses: normal Posterior pulses: normal  ABDOMEN:  Appearance: normal Benign, no organomegaly, no masses, no Abdominal Aortic enlargement. No Guarding , no rebound. No Bruits. Bowel sounds: normal  RECTAL: N/A GU: N/A  EXTREMETIES: nonedematous. Has a painful area on the right second toe. See Photo in Media  MUSCULOSKELETAL:  Spine: Kyphotic   NEUROLOGIC: oriented to time,place and person; nonfocal.  Results for orders placed in visit on 03/09/13  POCT GLYCOSYLATED HEMOGLOBIN (HGB A1C)      Result Value Range   Hemoglobin A1C 6.5      ASSESSMENT:  Primary biliary cirrhosis  Abscess of toe of right foot - see media - Plan: sulfamethoxazole-trimethoprim (BACTRIM DS) 800-160 MG per tablet, Aerobic culture  Diabetes mellitus without complication - Plan: POCT glycosylated hemoglobin (Hb A1C)  ASCVD (arteriosclerotic cardiovascular disease)  A-fib  Osteoporosis  Glaucoma  Esophageal stricture  Chronic anticoagulation  Arthritis  Unspecified vitamin D deficiency - Plan: Vit D  25 hydroxy (rtn osteoporosis monitoring)  Other dysphagia  Macular degeneration  Hyperlipidemia - Plan: Hepatic function panel, NMR, lipoprofile  Chronic kidney disease  Abnormal liver function test  PLAN: Procedure: area of toe behind the nail of the 2 nd toe right foot. Cleaned with alcohol and a needle was used to aspirate purulence. The area was decompressed and a culture was s ent. Area was cleaned and   Dressed.  Wound care discussed.  Orders Placed This Encounter  Procedures  . Aerobic culture  . Hepatic function panel  . NMR, lipoprofile  . Vit D  25 hydroxy (rtn osteoporosis monitoring)  . POCT glycosylated hemoglobin (Hb A1C)   Meds ordered this encounter  Medications  . sulfamethoxazole-trimethoprim (BACTRIM DS) 800-160 MG per tablet    Sig: Take 1 tablet by mouth 2 (two) times daily.    Dispense:  20 tablet    Refill:  0   There are no discontinued medications. Return in about 1 day (around 03/10/2013) for recheck toe.  Aadi Bordner P. Jacelyn Grip, M.D.

## 2013-03-10 ENCOUNTER — Encounter: Payer: Self-pay | Admitting: Family Medicine

## 2013-03-10 ENCOUNTER — Ambulatory Visit (INDEPENDENT_AMBULATORY_CARE_PROVIDER_SITE_OTHER): Payer: Medicare Other | Admitting: Family Medicine

## 2013-03-10 VITALS — BP 117/44 | HR 69 | Temp 97.1°F | Ht 63.0 in | Wt 111.4 lb

## 2013-03-10 DIAGNOSIS — R945 Abnormal results of liver function studies: Secondary | ICD-10-CM

## 2013-03-10 DIAGNOSIS — R7989 Other specified abnormal findings of blood chemistry: Secondary | ICD-10-CM

## 2013-03-10 DIAGNOSIS — I2510 Atherosclerotic heart disease of native coronary artery without angina pectoris: Secondary | ICD-10-CM

## 2013-03-10 DIAGNOSIS — E119 Type 2 diabetes mellitus without complications: Secondary | ICD-10-CM

## 2013-03-10 DIAGNOSIS — L02611 Cutaneous abscess of right foot: Secondary | ICD-10-CM

## 2013-03-10 DIAGNOSIS — D4989 Neoplasm of unspecified behavior of other specified sites: Secondary | ICD-10-CM

## 2013-03-10 DIAGNOSIS — K743 Primary biliary cirrhosis: Secondary | ICD-10-CM

## 2013-03-10 DIAGNOSIS — K745 Biliary cirrhosis, unspecified: Secondary | ICD-10-CM

## 2013-03-10 DIAGNOSIS — I7090 Unspecified atherosclerosis: Secondary | ICD-10-CM

## 2013-03-10 DIAGNOSIS — I4891 Unspecified atrial fibrillation: Secondary | ICD-10-CM

## 2013-03-10 LAB — POCT INR: INR: 3.8

## 2013-03-10 NOTE — Progress Notes (Signed)
Patient ID: Stacey Brown, female   DOB: 1922-04-21, 80 y.o.   MRN: [id number] SUBJECTIVE: CC: Chief Complaint  Patient presents with  . Follow-up    reck coumadin and  check rt toe     HPI: Here to recheck her toe and recheck her protime due to risks for drug iinteraction between warfarin and the antibiotic Bactrim DS.  Toe is not any worse. Slightly better. No fevers.   Past Medical History  Diagnosis Date  . Abnormal liver function test   . Atrial fibrillation   . Chronic anticoagulation   . History of DVT of lower extremity   . Primary biliary cirrhosis   . H/O TB (tuberculosis)   . Esophageal stricture   . Macular degeneration   . ASCVD (arteriosclerotic cardiovascular disease)   . Allergy     allergic  rhinitis  . Arthritis   . Diabetes mellitus without complication   . Glaucoma   . Hyperlipidemia   . Chronic kidney disease   . Osteoporosis    Past Surgical History  Procedure Laterality Date  . Abdominal hysterectomy    . Minor hemorrhoidectomy    . Left breast biopsy    . Cholecystectomy    . Herniography    . Right lung biopsy    . Right hip replacement    . Liver biopsy     History   Social History  . Marital Status: Widowed    Spouse Name: N/A    Number of Children: N/A  . Years of Education: N/A   Occupational History  . Retired     Disabled   Social History Main Topics  . Smoking status: Never Smoker   . Smokeless tobacco: Never Used  . Alcohol Use: No  . Drug Use: No  . Sexual Activity: Not on file   Other Topics Concern  . Not on file   Social History Narrative   Daily caffeine Use   Family History  Problem Relation Age of Onset  . Breast cancer Other     Neice  . Cirrhosis Other     Nephew  . Heart disease Mother   . Heart disease Brother   . Diabetes Mother     Brother,  . Kidney disease Cousin   . Colon cancer Neg Hx    Current Outpatient Prescriptions on File Prior to Visit  Medication Sig Dispense Refill  .  amLODipine (NORVASC) 5 MG tablet Take 5 mg by mouth daily.      . carboxymethylcellulose (REFRESH PLUS) 0.5 % SOLN 1 drop 3 (three) times daily as needed.      . cromolyn (OPTICROM) 4 % ophthalmic solution       . fexofenadine (ALLEGRA) 180 MG tablet Take 180 mg by mouth daily.      . furosemide (LASIX) 20 MG tablet TAKE ONE TABLET BY MOUTH ONE TIME DAILY  30 tablet  5  . glimepiride (AMARYL) 2 MG tablet TAKE ONE TABLET BY MOUTH ONE TIME DAILY BEFORE BREAKFAST  30 tablet  2  . nebivolol (BYSTOLIC) 5 MG tablet Take 5 mg by mouth daily.      Marland Kitchen omeprazole (PRILOSEC) 40 MG capsule Take 40 mg by mouth daily.      . polyethylene glycol powder (GLYCOLAX/MIRALAX) powder Take 17 g by mouth 2 (two) times daily as needed.  3350 g  1  . rosuvastatin (CRESTOR) 20 MG tablet Take 20 mg by mouth daily.      Marland Kitchen sulfamethoxazole-trimethoprim (BACTRIM DS)  800-160 MG per tablet Take 1 tablet by mouth 2 (two) times daily.  20 tablet  0  . travoprost, benzalkonium, (TRAVATAN) 0.004 % ophthalmic solution Place 1 drop into both eyes at bedtime.      . ursodiol (ACTIGALL) 300 MG capsule TAKE ONE CAPSULE BY MOUTH TWICE DAILY  60 capsule  11  . Vitamin D, Ergocalciferol, (DRISDOL) 50000 UNITS CAPS Take 1 capsule (50,000 Units total) by mouth every 7 (seven) days.  4 capsule  0  . warfarin (COUMADIN) 3 MG tablet Take 1 and 1/2 tablets by mouth on sundays and 1 tablet all other days or as directed by anticoagulation clinic  100 tablet  1  . warfarin (COUMADIN) 3 MG tablet TAKE 1 AND 1/2 TABLETS BY MOUTH ON SUNDAYS AND THURSDAYS AND 1 TABLETALL OTHER DAYS OR AS DIRECTED BY  100 tablet  0   No current facility-administered medications on file prior to visit.   Allergies  Allergen Reactions  . Dexilant [Dexlansoprazole]     Throat swells   . Ace Inhibitors    Immunization History  Administered Date(s) Administered  . Influenza,inj,Quad PF,36+ Mos 11/03/2012  . Tdap 05/19/2012   Prior to Admission medications    Medication Sig Start Date End Date Taking? Authorizing Provider  amLODipine (NORVASC) 5 MG tablet Take 5 mg by mouth daily.    Historical Provider, MD  carboxymethylcellulose (REFRESH PLUS) 0.5 % SOLN 1 drop 3 (three) times daily as needed.    Historical Provider, MD  cromolyn (OPTICROM) 4 % ophthalmic solution  04/22/12   Historical Provider, MD  fexofenadine (ALLEGRA) 180 MG tablet Take 180 mg by mouth daily.    Historical Provider, MD  furosemide (LASIX) 20 MG tablet TAKE ONE TABLET BY MOUTH ONE TIME DAILY 10/08/12   Chipper Herb, MD  glimepiride (AMARYL) 2 MG tablet TAKE ONE TABLET BY MOUTH ONE TIME DAILY BEFORE BREAKFAST 12/31/12   Vernie Shanks, MD  nebivolol (BYSTOLIC) 5 MG tablet Take 5 mg by mouth daily.    Historical Provider, MD  omeprazole (PRILOSEC) 40 MG capsule Take 40 mg by mouth daily.    Historical Provider, MD  polyethylene glycol powder (GLYCOLAX/MIRALAX) powder Take 17 g by mouth 2 (two) times daily as needed. 11/03/12   Vernie Shanks, MD  rosuvastatin (CRESTOR) 20 MG tablet Take 20 mg by mouth daily.    Historical Provider, MD  sulfamethoxazole-trimethoprim (BACTRIM DS) 800-160 MG per tablet Take 1 tablet by mouth 2 (two) times daily. 03/09/13   Vernie Shanks, MD  travoprost, benzalkonium, (TRAVATAN) 0.004 % ophthalmic solution Place 1 drop into both eyes at bedtime.    Historical Provider, MD  ursodiol (ACTIGALL) 300 MG capsule TAKE ONE CAPSULE BY MOUTH TWICE DAILY 06/29/11   Inda Castle, MD  Vitamin D, Ergocalciferol, (DRISDOL) 50000 UNITS CAPS Take 1 capsule (50,000 Units total) by mouth every 7 (seven) days. 08/22/12   Chipper Herb, MD  warfarin (COUMADIN) 3 MG tablet Take 1 and 1/2 tablets by mouth on sundays and 1 tablet all other days or as directed by anticoagulation clinic 11/03/12   Vernie Shanks, MD  warfarin (COUMADIN) 3 MG tablet TAKE 1 AND 1/2 TABLETS BY MOUTH ON SUNDAYS AND THURSDAYS AND 1 TABLETALL OTHER DAYS OR AS DIRECTED BY 03/02/13   Chipper Herb, MD      ROS: As above in the HPI. All other systems are stable or negative.  OBJECTIVE: APPEARANCE:  Patient in no acute distress.The patient appeared  well nourished and normally developed. Acyanotic. Waist: VITAL SIGNS:BP 117/44  Pulse 69  Temp(Src) 97.1 F (36.2 C) (Oral)  Ht 5\' 3"  (1.6 m)  Wt 111 lb 6.4 oz (50.531 kg)  BMI 19.74 kg/m2  Elderly WF Kyphotic. SKIN: warm and  Dry without overt rashes, tattoos and scars. Right cheek has a small keratotic lesion. Scaly   HEAD and Neck: without JVD, Head and scalp: normal Eyes:No scleral icterus. Fundi normal, eye movements normal. Ears: Auricle normal, canal normal, Tympanic membranes normal, insufflation normal. Nose: normal Throat: normal Neck & thyroid: normal  EXTREMETIES: nonedematous. Right 2 dn toe. The area behind the toe nail is slight drainage. Slight redness. And the blister is deflated. No purulence today. Not any worse.  MUSCULOSKELETAL:  Spine: normal Joints: intact  NEUROLOGIC: oriented to time,place and person; nonfocal. Strength is normal Sensory is normal Reflexes are normal Cranial Nerves are normal.  ASSESSMENT: Abscess of toe of right foot  Diabetes mellitus without complication  A-fib - Plan: POCT INR  Primary biliary cirrhosis  Abnormal liver function test  ASCVD (arteriosclerotic cardiovascular disease)  Neoplasm of face - Plan: Ambulatory referral to Dermatology  PLAN: Results for orders placed in visit on 03/10/13  POCT INR      Result Value Range   INR 3.8     INR higher than goal of 2 to 3  Orders Placed This Encounter  Procedures  . Ambulatory referral to Dermatology    Referral Priority:  Routine    Referral Type:  Consultation    Referral Reason:  Specialty Services Required    Requested Specialty:  Dermatology    Number of Visits Requested:  1   Hold coumadin today. Take 1/2 of a tablet on Saturday and Sunday. Return on Monday to recheck toe and coumadin/protime  test. Continue the antibiotics. No orders of the defined types were placed in this encounter.   There are no discontinued medications. Return in about 3 days (around 03/13/2013) for recheck protime, Recheck medical problems.  Savaughn Karwowski P. Jacelyn Grip, M.D.

## 2013-03-10 NOTE — Patient Instructions (Signed)
Hold coumadin today. Take 1/2 of a tablet on Saturday and Sunday. Return on Monday to recheck toe and coumadin/protime test.

## 2013-03-11 LAB — HEPATIC FUNCTION PANEL
ALT: 11 IU/L (ref 0–32)
AST: 24 IU/L (ref 0–40)
Albumin: 4.2 g/dL (ref 3.5–4.7)
Alkaline Phosphatase: 104 IU/L (ref 39–117)
Bilirubin, Direct: 0.17 mg/dL (ref 0.00–0.40)
Total Bilirubin: 0.4 mg/dL (ref 0.0–1.2)
Total Protein: 6.5 g/dL (ref 6.0–8.5)

## 2013-03-11 LAB — NMR, LIPOPROFILE
Cholesterol: 119 mg/dL (ref <200)
HDL Cholesterol by NMR: 31 mg/dL — ABNORMAL LOW (ref >=40)
HDL Particle Number: 23.3 umol/L — ABNORMAL LOW (ref >=30.5)
LDL Particle Number: 910 nmol/L (ref <1000)
LDL Size: 20.6 nm (ref >20.5)
LDLC SERPL CALC-MCNC: 43 mg/dL (ref <100)
LP-IR Score: 55 — ABNORMAL HIGH (ref <=45)
Small LDL Particle Number: 527 nmol/L (ref <=527)
Triglycerides by NMR: 226 mg/dL — ABNORMAL HIGH (ref <150)

## 2013-03-11 LAB — VITAMIN D 25 HYDROXY (VIT D DEFICIENCY, FRACTURES): Vit D, 25-Hydroxy: 42 ng/mL (ref 30.0–100.0)

## 2013-03-13 ENCOUNTER — Ambulatory Visit: Payer: Medicare Other | Admitting: Family Medicine

## 2013-03-13 LAB — AEROBIC CULTURE

## 2013-03-14 ENCOUNTER — Ambulatory Visit (INDEPENDENT_AMBULATORY_CARE_PROVIDER_SITE_OTHER): Payer: Medicare Other | Admitting: Family Medicine

## 2013-03-14 ENCOUNTER — Encounter: Payer: Self-pay | Admitting: Family Medicine

## 2013-03-14 ENCOUNTER — Encounter: Payer: Self-pay | Admitting: Pharmacist Clinician (PhC)/ Clinical Pharmacy Specialist

## 2013-03-14 VITALS — BP 154/75 | HR 77 | Temp 97.5°F | Ht 63.0 in | Wt 111.0 lb

## 2013-03-14 DIAGNOSIS — L02611 Cutaneous abscess of right foot: Secondary | ICD-10-CM

## 2013-03-14 DIAGNOSIS — I4891 Unspecified atrial fibrillation: Secondary | ICD-10-CM

## 2013-03-14 DIAGNOSIS — L02619 Cutaneous abscess of unspecified foot: Secondary | ICD-10-CM

## 2013-03-14 DIAGNOSIS — L03039 Cellulitis of unspecified toe: Secondary | ICD-10-CM

## 2013-03-14 LAB — POCT INR: INR: 3.1

## 2013-03-14 MED ORDER — CIPROFLOXACIN HCL 250 MG PO TABS: 250.0000 mg | ORAL_TABLET | Freq: Two times a day (BID) | ORAL | Status: DC

## 2013-03-14 NOTE — Progress Notes (Signed)
Patient ID: Stacey Brown, female   DOB: [birth year], 80 y.o.   MRN: [id number] SUBJECTIVE: CC: Chief Complaint  Patient presents with  . Follow-up    reck toe and protime     HPI: Toe is doing great. Doesn't hurt anymore. Also came to recheck the protime. Past Medical History  Diagnosis Date  . Abnormal liver function test   . Atrial fibrillation   . Chronic anticoagulation   . History of DVT of lower extremity   . Primary biliary cirrhosis   . H/O TB (tuberculosis)   . Esophageal stricture   . Macular degeneration   . ASCVD (arteriosclerotic cardiovascular disease)   . Allergy     allergic  rhinitis  . Arthritis   . Diabetes mellitus without complication   . Glaucoma   . Hyperlipidemia   . Chronic kidney disease   . Osteoporosis    Past Surgical History  Procedure Laterality Date  . Abdominal hysterectomy    . Minor hemorrhoidectomy    . Left breast biopsy    . Cholecystectomy    . Herniography    . Right lung biopsy    . Right hip replacement    . Liver biopsy     History   Social History  . Marital Status: Widowed    Spouse Name: N/A    Number of Children: N/A  . Years of Education: N/A   Occupational History  . Retired     Disabled   Social History Main Topics  . Smoking status: Never Smoker   . Smokeless tobacco: Never Used  . Alcohol Use: No  . Drug Use: No  . Sexual Activity: Not on file   Other Topics Concern  . Not on file   Social History Narrative   Daily caffeine Use   Family History  Problem Relation Age of Onset  . Breast cancer Other     Neice  . Cirrhosis Other     Nephew  . Heart disease Mother   . Heart disease Brother   . Diabetes Mother     Brother,  . Kidney disease Cousin   . Colon cancer Neg Hx    Current Outpatient Prescriptions on File Prior to Visit  Medication Sig Dispense Refill  . amLODipine (NORVASC) 5 MG tablet Take 5 mg by mouth daily.      . carboxymethylcellulose (REFRESH PLUS) 0.5 % SOLN 1 drop 3  (three) times daily as needed.      . cromolyn (OPTICROM) 4 % ophthalmic solution       . fexofenadine (ALLEGRA) 180 MG tablet Take 180 mg by mouth daily.      . furosemide (LASIX) 20 MG tablet TAKE ONE TABLET BY MOUTH ONE TIME DAILY  30 tablet  5  . glimepiride (AMARYL) 2 MG tablet TAKE ONE TABLET BY MOUTH ONE TIME DAILY BEFORE BREAKFAST  30 tablet  2  . nebivolol (BYSTOLIC) 5 MG tablet Take 5 mg by mouth daily.      Marland Kitchen omeprazole (PRILOSEC) 40 MG capsule Take 40 mg by mouth daily.      . polyethylene glycol powder (GLYCOLAX/MIRALAX) powder Take 17 g by mouth 2 (two) times daily as needed.  3350 g  1  . rosuvastatin (CRESTOR) 20 MG tablet Take 20 mg by mouth daily.      . travoprost, benzalkonium, (TRAVATAN) 0.004 % ophthalmic solution Place 1 drop into both eyes at bedtime.      . ursodiol (ACTIGALL) 300 MG capsule TAKE  ONE CAPSULE BY MOUTH TWICE DAILY  60 capsule  11  . Vitamin D, Ergocalciferol, (DRISDOL) 50000 UNITS CAPS Take 1 capsule (50,000 Units total) by mouth every 7 (seven) days.  4 capsule  0  . warfarin (COUMADIN) 3 MG tablet Take 1 and 1/2 tablets by mouth on sundays and 1 tablet all other days or as directed by anticoagulation clinic  100 tablet  1  . warfarin (COUMADIN) 3 MG tablet TAKE 1 AND 1/2 TABLETS BY MOUTH ON SUNDAYS AND THURSDAYS AND 1 TABLETALL OTHER DAYS OR AS DIRECTED BY  100 tablet  0   No current facility-administered medications on file prior to visit.   Allergies  Allergen Reactions  . Dexilant [Dexlansoprazole]     Throat swells   . Ace Inhibitors    Immunization History  Administered Date(s) Administered  . Influenza,inj,Quad PF,36+ Mos 11/03/2012  . Tdap 05/19/2012   Prior to Admission medications   Medication Sig Start Date End Date Taking? Authorizing Provider  amLODipine (NORVASC) 5 MG tablet Take 5 mg by mouth daily.    Historical Provider, MD  carboxymethylcellulose (REFRESH PLUS) 0.5 % SOLN 1 drop 3 (three) times daily as needed.    Historical  Provider, MD  cromolyn (OPTICROM) 4 % ophthalmic solution  04/22/12   Historical Provider, MD  fexofenadine (ALLEGRA) 180 MG tablet Take 180 mg by mouth daily.    Historical Provider, MD  furosemide (LASIX) 20 MG tablet TAKE ONE TABLET BY MOUTH ONE TIME DAILY 10/08/12   Chipper Herb, MD  glimepiride (AMARYL) 2 MG tablet TAKE ONE TABLET BY MOUTH ONE TIME DAILY BEFORE BREAKFAST 12/31/12   Vernie Shanks, MD  nebivolol (BYSTOLIC) 5 MG tablet Take 5 mg by mouth daily.    Historical Provider, MD  omeprazole (PRILOSEC) 40 MG capsule Take 40 mg by mouth daily.    Historical Provider, MD  polyethylene glycol powder (GLYCOLAX/MIRALAX) powder Take 17 g by mouth 2 (two) times daily as needed. 11/03/12   Vernie Shanks, MD  rosuvastatin (CRESTOR) 20 MG tablet Take 20 mg by mouth daily.    Historical Provider, MD  sulfamethoxazole-trimethoprim (BACTRIM DS) 800-160 MG per tablet Take 1 tablet by mouth 2 (two) times daily. 03/09/13   Vernie Shanks, MD  travoprost, benzalkonium, (TRAVATAN) 0.004 % ophthalmic solution Place 1 drop into both eyes at bedtime.    Historical Provider, MD  ursodiol (ACTIGALL) 300 MG capsule TAKE ONE CAPSULE BY MOUTH TWICE DAILY 06/29/11   Inda Castle, MD  Vitamin D, Ergocalciferol, (DRISDOL) 50000 UNITS CAPS Take 1 capsule (50,000 Units total) by mouth every 7 (seven) days. 08/22/12   Chipper Herb, MD  warfarin (COUMADIN) 3 MG tablet Take 1 and 1/2 tablets by mouth on sundays and 1 tablet all other days or as directed by anticoagulation clinic 11/03/12   Vernie Shanks, MD  warfarin (COUMADIN) 3 MG tablet TAKE 1 AND 1/2 TABLETS BY MOUTH ON SUNDAYS AND THURSDAYS AND 1 TABLETALL OTHER DAYS OR AS DIRECTED BY 03/02/13   Chipper Herb, MD     ROS: As above in the HPI. All other systems are stable or negative.  OBJECTIVE: APPEARANCE:  Patient in no acute distress.The patient appeared well nourished and normally developed. Acyanotic. Waist: VITAL SIGNS:BP 154/75  Pulse 77   Temp(Src) 97.5 F (36.4 C) (Oral)  Ht 5\' 3"  (1.6 m)  Wt 111 lb (50.349 kg)  BMI 19.67 kg/m2 Elderly WF  SKIN: warm and  Dry . Right second  toe. The abscess has improved.. No tenderness. No drainage. The area behind the nail has healed nicely with mild redness.  Results for orders placed in visit on 03/14/13  POCT INR      Result Value Range   INR 3.1      ASSESSMENT: A-fib - Plan: POCT INR  Abscess of toe of right foot - Plan: ciprofloxacin (CIPRO) 250 MG tablet The culture of the fluid grew pseudomonas.  PLAN: Will change antibiotics to cover the organism grown. Effects on the warfarin discussed with patient and  Daughter. No orders of the defined types were placed in this encounter.   Meds ordered this encounter  Medications  . ciprofloxacin (CIPRO) 250 MG tablet    Sig: Take 1 tablet (250 mg total) by mouth 2 (two) times daily.    Dispense:  10 tablet    Refill:  0   Medications Discontinued During This Encounter  Medication Reason  . sulfamethoxazole-trimethoprim (BACTRIM DS) 800-160 MG per tablet Change in therapy   Return in about 3 days (around 03/17/2013) for Recheck medical problems.  Hadley Detloff P. Jacelyn Grip, M.D.

## 2013-03-14 NOTE — Progress Notes (Signed)
Patient came in for an INR check today.  INR is theapeutic with no changes in her diet or medications.  Patient denies any bleeding such as, nose bleeds or blood in the urine or stool.  No recent dental procedures or antibiotics have been used.  She is to continue taking her warfarin the same way and come back in 6 weeks for a re-check.

## 2013-03-17 ENCOUNTER — Ambulatory Visit (INDEPENDENT_AMBULATORY_CARE_PROVIDER_SITE_OTHER): Payer: Medicare Other | Admitting: Family Medicine

## 2013-03-17 ENCOUNTER — Encounter: Payer: Self-pay | Admitting: Family Medicine

## 2013-03-17 ENCOUNTER — Other Ambulatory Visit: Payer: Self-pay | Admitting: Family Medicine

## 2013-03-17 VITALS — BP 150/74 | HR 106 | Temp 97.2°F | Ht 63.0 in | Wt 109.2 lb

## 2013-03-17 DIAGNOSIS — I4891 Unspecified atrial fibrillation: Secondary | ICD-10-CM

## 2013-03-17 DIAGNOSIS — L02619 Cutaneous abscess of unspecified foot: Secondary | ICD-10-CM

## 2013-03-17 DIAGNOSIS — L02611 Cutaneous abscess of right foot: Secondary | ICD-10-CM

## 2013-03-17 DIAGNOSIS — L03039 Cellulitis of unspecified toe: Secondary | ICD-10-CM

## 2013-03-17 LAB — POCT INR: INR: 2.1

## 2013-03-17 NOTE — Progress Notes (Signed)
Patient ID: Stacey Brown, female   DOB: [birth year], 80 y.o.   MRN: [id number] SUBJECTIVE: CC: Chief Complaint  Patient presents with  . Follow-up    rck pro time and right foot ck rt hand     HPI: Here to recheck the toe and her protime, because of drug interaction between the antibiotic and warfarin. Doing well. Toe is clean dry and no longer hurts.  Past Medical History  Diagnosis Date  . Abnormal liver function test   . Atrial fibrillation   . Chronic anticoagulation   . History of DVT of lower extremity   . Primary biliary cirrhosis   . H/O TB (tuberculosis)   . Esophageal stricture   . Macular degeneration   . ASCVD (arteriosclerotic cardiovascular disease)   . Allergy     allergic  rhinitis  . Arthritis   . Diabetes mellitus without complication   . Glaucoma   . Hyperlipidemia   . Chronic kidney disease   . Osteoporosis    Past Surgical History  Procedure Laterality Date  . Abdominal hysterectomy    . Minor hemorrhoidectomy    . Left breast biopsy    . Cholecystectomy    . Herniography    . Right lung biopsy    . Right hip replacement    . Liver biopsy     History   Social History  . Marital Status: Widowed    Spouse Name: N/A    Number of Children: N/A  . Years of Education: N/A   Occupational History  . Retired     Disabled   Social History Main Topics  . Smoking status: Never Smoker   . Smokeless tobacco: Never Used  . Alcohol Use: No  . Drug Use: No  . Sexual Activity: Not on file   Other Topics Concern  . Not on file   Social History Narrative   Daily caffeine Use   Family History  Problem Relation Age of Onset  . Breast cancer Other     Neice  . Cirrhosis Other     Nephew  . Heart disease Mother   . Heart disease Brother   . Diabetes Mother     Brother,  . Kidney disease Cousin   . Colon cancer Neg Hx    Current Outpatient Prescriptions on File Prior to Visit  Medication Sig Dispense Refill  . amLODipine (NORVASC) 5 MG  tablet Take 5 mg by mouth daily.      . carboxymethylcellulose (REFRESH PLUS) 0.5 % SOLN 1 drop 3 (three) times daily as needed.      . ciprofloxacin (CIPRO) 250 MG tablet Take 1 tablet (250 mg total) by mouth 2 (two) times daily.  10 tablet  0  . cromolyn (OPTICROM) 4 % ophthalmic solution       . fexofenadine (ALLEGRA) 180 MG tablet Take 180 mg by mouth daily.      . furosemide (LASIX) 20 MG tablet TAKE ONE TABLET BY MOUTH ONE TIME DAILY  30 tablet  5  . glimepiride (AMARYL) 2 MG tablet TAKE ONE TABLET BY MOUTH ONE TIME DAILY BEFORE BREAKFAST  30 tablet  2  . nebivolol (BYSTOLIC) 5 MG tablet Take 5 mg by mouth daily.      Marland Kitchen omeprazole (PRILOSEC) 40 MG capsule Take 40 mg by mouth daily.      . polyethylene glycol powder (GLYCOLAX/MIRALAX) powder Take 17 g by mouth 2 (two) times daily as needed.  3350 g  1  .  rosuvastatin (CRESTOR) 20 MG tablet Take 20 mg by mouth daily.      . travoprost, benzalkonium, (TRAVATAN) 0.004 % ophthalmic solution Place 1 drop into both eyes at bedtime.      . ursodiol (ACTIGALL) 300 MG capsule TAKE ONE CAPSULE BY MOUTH TWICE DAILY  60 capsule  11  . Vitamin D, Ergocalciferol, (DRISDOL) 50000 UNITS CAPS Take 1 capsule (50,000 Units total) by mouth every 7 (seven) days.  4 capsule  0  . warfarin (COUMADIN) 3 MG tablet TAKE 1 AND 1/2 TABLETS BY MOUTH ON SUNDAYS AND THURSDAYS AND 1 TABLETALL OTHER DAYS OR AS DIRECTED BY  100 tablet  0   No current facility-administered medications on file prior to visit.   Allergies  Allergen Reactions  . Dexilant [Dexlansoprazole]     Throat swells   . Ace Inhibitors    Immunization History  Administered Date(s) Administered  . Influenza,inj,Quad PF,36+ Mos 11/03/2012  . Tdap 05/19/2012   Prior to Admission medications   Medication Sig Start Date End Date Taking? Authorizing Provider  amLODipine (NORVASC) 5 MG tablet Take 5 mg by mouth daily.    Historical Provider, MD  carboxymethylcellulose (REFRESH PLUS) 0.5 % SOLN 1 drop  3 (three) times daily as needed.    Historical Provider, MD  ciprofloxacin (CIPRO) 250 MG tablet Take 1 tablet (250 mg total) by mouth 2 (two) times daily. 03/14/13   Vernie Shanks, MD  cromolyn (OPTICROM) 4 % ophthalmic solution  04/22/12   Historical Provider, MD  fexofenadine (ALLEGRA) 180 MG tablet Take 180 mg by mouth daily.    Historical Provider, MD  furosemide (LASIX) 20 MG tablet TAKE ONE TABLET BY MOUTH ONE TIME DAILY 10/08/12   Chipper Herb, MD  glimepiride (AMARYL) 2 MG tablet TAKE ONE TABLET BY MOUTH ONE TIME DAILY BEFORE BREAKFAST 12/31/12   Vernie Shanks, MD  nebivolol (BYSTOLIC) 5 MG tablet Take 5 mg by mouth daily.    Historical Provider, MD  omeprazole (PRILOSEC) 40 MG capsule Take 40 mg by mouth daily.    Historical Provider, MD  polyethylene glycol powder (GLYCOLAX/MIRALAX) powder Take 17 g by mouth 2 (two) times daily as needed. 11/03/12   Vernie Shanks, MD  rosuvastatin (CRESTOR) 20 MG tablet Take 20 mg by mouth daily.    Historical Provider, MD  sulfamethoxazole-trimethoprim (BACTRIM DS) 800-160 MG per tablet  03/09/13   Historical Provider, MD  travoprost, benzalkonium, (TRAVATAN) 0.004 % ophthalmic solution Place 1 drop into both eyes at bedtime.    Historical Provider, MD  ursodiol (ACTIGALL) 300 MG capsule TAKE ONE CAPSULE BY MOUTH TWICE DAILY 06/29/11   Inda Castle, MD  Vitamin D, Ergocalciferol, (DRISDOL) 50000 UNITS CAPS Take 1 capsule (50,000 Units total) by mouth every 7 (seven) days. 08/22/12   Chipper Herb, MD  warfarin (COUMADIN) 3 MG tablet Take 1 and 1/2 tablets by mouth on sundays and 1 tablet all other days or as directed by anticoagulation clinic 11/03/12   Vernie Shanks, MD  warfarin (COUMADIN) 3 MG tablet TAKE 1 AND 1/2 TABLETS BY MOUTH ON SUNDAYS AND THURSDAYS AND 1 TABLETALL OTHER DAYS OR AS DIRECTED BY 03/02/13   Chipper Herb, MD     ROS: As above in the HPI. All other systems are stable or negative.  OBJECTIVE: APPEARANCE:  Patient in no  acute distress.The patient appeared well nourished and normally developed. Acyanotic. Waist: VITAL SIGNS:BP 150/74  Pulse 106  Temp(Src) 97.2 F (36.2 C) (Oral)  Ht 5\' 3"  (1.6 m)  Wt 109 lb 3.2 oz (49.533 kg)  BMI 19.35 kg/m2   SKIN: warm and  Dry without overt rashes, tattoos and scars  EXTREMETIES: the right second toe is no longer showing signs of infection   ASSESSMENT:  Abscess of toe of right foot  A-fib - Plan: POCT INR  PLAN:  Complete the cipro .  Results for orders placed in visit on 03/17/13  POCT INR      Result Value Range   INR 2.1     Return to the regular dosing schedule of her warfarin with 1  1/2 tabs twice a week and 1 tab all the other days.  Medications Discontinued During This Encounter  Medication Reason  . sulfamethoxazole-trimethoprim (BACTRIM DS) 800-160 MG per tablet   . warfarin (COUMADIN) 3 MG tablet Error   Return in about 2 weeks (around 03/31/2013), or anticoag clinic, for recheck protime.  Suhaila Troiano P. Jacelyn Grip, M.D.

## 2013-04-03 ENCOUNTER — Ambulatory Visit (INDEPENDENT_AMBULATORY_CARE_PROVIDER_SITE_OTHER): Payer: Medicare Other | Admitting: Pharmacist

## 2013-04-03 DIAGNOSIS — I4891 Unspecified atrial fibrillation: Secondary | ICD-10-CM

## 2013-04-03 LAB — POCT INR: INR: 4.2

## 2013-04-03 MED ORDER — LEVOCETIRIZINE DIHYDROCHLORIDE 5 MG PO TABS: 5.0000 mg | ORAL_TABLET | Freq: Every evening | ORAL | Status: DC

## 2013-04-04 ENCOUNTER — Other Ambulatory Visit: Payer: Self-pay | Admitting: Family Medicine

## 2013-04-13 ENCOUNTER — Ambulatory Visit (INDEPENDENT_AMBULATORY_CARE_PROVIDER_SITE_OTHER): Payer: Medicare Other | Admitting: Pharmacist

## 2013-04-13 DIAGNOSIS — I4891 Unspecified atrial fibrillation: Secondary | ICD-10-CM

## 2013-04-13 LAB — POCT INR: INR: 2.3

## 2013-04-13 NOTE — Patient Instructions (Signed)
Anticoagulation Dose Instructions as of 04/13/2013     Stacey Brown Tue Wed Thu Fri Sat   New Dose 3 mg 3 mg 3 mg 3 mg 3 mg 3 mg 3 mg    Description       Continue 1 tablet daily        INR was 2.3 today

## 2013-04-17 ENCOUNTER — Other Ambulatory Visit: Payer: Self-pay | Admitting: Family Medicine

## 2013-05-02 ENCOUNTER — Ambulatory Visit (INDEPENDENT_AMBULATORY_CARE_PROVIDER_SITE_OTHER): Payer: Medicare Other | Admitting: Pharmacist Clinician (PhC)/ Clinical Pharmacy Specialist

## 2013-05-02 DIAGNOSIS — I4891 Unspecified atrial fibrillation: Secondary | ICD-10-CM

## 2013-05-02 LAB — POCT INR: INR: 3

## 2013-06-02 ENCOUNTER — Other Ambulatory Visit: Payer: Self-pay | Admitting: Family Medicine

## 2013-06-07 ENCOUNTER — Other Ambulatory Visit: Payer: Self-pay | Admitting: Family Medicine

## 2013-06-07 ENCOUNTER — Other Ambulatory Visit: Payer: Self-pay | Admitting: Pharmacist

## 2013-06-13 ENCOUNTER — Encounter: Payer: Self-pay | Admitting: Family Medicine

## 2013-06-13 ENCOUNTER — Ambulatory Visit (INDEPENDENT_AMBULATORY_CARE_PROVIDER_SITE_OTHER): Payer: Medicare Other | Admitting: Family Medicine

## 2013-06-13 ENCOUNTER — Ambulatory Visit (INDEPENDENT_AMBULATORY_CARE_PROVIDER_SITE_OTHER): Payer: Medicare Other | Admitting: Pharmacist Clinician (PhC)/ Clinical Pharmacy Specialist

## 2013-06-13 VITALS — BP 162/69 | HR 76 | Temp 97.0°F | Ht 63.0 in | Wt 114.0 lb

## 2013-06-13 DIAGNOSIS — I4891 Unspecified atrial fibrillation: Secondary | ICD-10-CM

## 2013-06-13 DIAGNOSIS — R6889 Other general symptoms and signs: Secondary | ICD-10-CM

## 2013-06-13 DIAGNOSIS — M810 Age-related osteoporosis without current pathological fracture: Secondary | ICD-10-CM

## 2013-06-13 LAB — POCT CBC
Granulocyte percent: 80.1 %G — AB (ref 37–80)
HCT, POC: 38.4 % (ref 37.7–47.9)
Hemoglobin: 12.4 g/dL (ref 12.2–16.2)
Lymph, poc: 1.3 (ref 0.6–3.4)
MCH, POC: 29.5 pg (ref 27–31.2)
MCHC: 32.3 g/dL (ref 31.8–35.4)
MCV: 91.4 fL (ref 80–97)
MPV: 9.4 fL (ref 0–99.8)
POC Granulocyte: 5.9 (ref 2–6.9)
POC LYMPH PERCENT: 18 %L (ref 10–50)
Platelet Count, POC: 129 10*3/uL — AB (ref 142–424)
RBC: 4.2 M/uL (ref 4.04–5.48)
RDW, POC: 15.6 %
WBC: 7.4 10*3/uL (ref 4.6–10.2)

## 2013-06-13 LAB — POCT HEMOGLOBIN: Hemoglobin: 10.5 g/dL — AB (ref 12.2–16.2)

## 2013-06-13 LAB — POCT INR: INR: 6.2

## 2013-06-13 NOTE — Progress Notes (Signed)
Patient ID: Stacey Brown, female   DOB: [birth year], 80 y.o.   MRN: [id number] SUBJECTIVE: CC: Chief Complaint  Patient presents with  . abnormal protime    per michelle    HPI: Had her protime today and was found to have a critically elevated INR and incidentally significant drop in the hemoglobin on the finger stick by 3 points drop. Brought in a room to check for Bleeding. Patient feels the same as usual.  See labs and anticoag visit.  Past Medical History  Diagnosis Date  . Abnormal liver function test   . Atrial fibrillation   . Chronic anticoagulation   . History of DVT of lower extremity   . Primary biliary cirrhosis   . H/O TB (tuberculosis)   . Esophageal stricture   . Macular degeneration   . ASCVD (arteriosclerotic cardiovascular disease)   . Allergy     allergic  rhinitis  . Arthritis   . Diabetes mellitus without complication   . Glaucoma   . Hyperlipidemia   . Chronic kidney disease   . Osteoporosis    Past Surgical History  Procedure Laterality Date  . Abdominal hysterectomy    . Minor hemorrhoidectomy    . Left breast biopsy    . Cholecystectomy    . Herniography    . Right lung biopsy    . Right hip replacement    . Liver biopsy     History   Social History  . Marital Status: Widowed    Spouse Name: N/A    Number of Children: N/A  . Years of Education: N/A   Occupational History  . Retired     Disabled   Social History Main Topics  . Smoking status: Never Smoker   . Smokeless tobacco: Never Used  . Alcohol Use: No  . Drug Use: No  . Sexual Activity: Not on file   Other Topics Concern  . Not on file   Social History Narrative   Daily caffeine Use   Family History  Problem Relation Age of Onset  . Breast cancer Other     Neice  . Cirrhosis Other     Nephew  . Heart disease Mother   . Heart disease Brother   . Diabetes Mother     Brother,  . Kidney disease Cousin   . Colon cancer Neg Hx    Current Outpatient  Prescriptions on File Prior to Visit  Medication Sig Dispense Refill  . amLODipine (NORVASC) 5 MG tablet Take 5 mg by mouth daily.      . carboxymethylcellulose (REFRESH PLUS) 0.5 % SOLN 1 drop 3 (three) times daily as needed.      . CRESTOR 20 MG tablet TAKE ONE TABLET BY MOUTH ONE TIME DAILY  30 tablet  5  . cromolyn (OPTICROM) 4 % ophthalmic solution       . furosemide (LASIX) 20 MG tablet TAKE ONE TABLET BY MOUTH ONE TIME DAILY  30 tablet  4  . glimepiride (AMARYL) 2 MG tablet TAKE ONE TABLET BY MOUTH ONE TIME DAILY BEFORE BREAKFAST  30 tablet  0  . levocetirizine (XYZAL) 5 MG tablet TAKE 1 TABLET (5 MG TOTAL) BY MOUTH EVERY EVENING.  30 tablet  2  . nebivolol (BYSTOLIC) 5 MG tablet Take 5 mg by mouth daily.      Marland Kitchen omeprazole (PRILOSEC) 40 MG capsule Take 40 mg by mouth daily.      . travoprost, benzalkonium, (TRAVATAN) 0.004 % ophthalmic solution Place 1  drop into both eyes at bedtime.      . ursodiol (ACTIGALL) 300 MG capsule TAKE ONE CAPSULE BY MOUTH TWICE DAILY  60 capsule  11  . warfarin (COUMADIN) 3 MG tablet TAKE 1 AND 1/2 TABLETS BY MOUTH ON SUNDAYS AND THURSDAYS, THEN 1 TABLET DAILY ON ALL OTHER DAYS  100 tablet  0  . [DISCONTINUED] fexofenadine (ALLEGRA) 180 MG tablet Take 180 mg by mouth daily.       No current facility-administered medications on file prior to visit.   Allergies  Allergen Reactions  . Dexilant [Dexlansoprazole]     Throat swells   . Ace Inhibitors    Immunization History  Administered Date(s) Administered  . Influenza,inj,Quad PF,36+ Mos 11/03/2012  . Tdap 05/19/2012   Prior to Admission medications   Medication Sig Start Date End Date Taking? Authorizing Provider  amLODipine (NORVASC) 5 MG tablet Take 5 mg by mouth daily.   Yes Historical Provider, MD  carboxymethylcellulose (REFRESH PLUS) 0.5 % SOLN 1 drop 3 (three) times daily as needed.   Yes Historical Provider, MD  CRESTOR 20 MG tablet TAKE ONE TABLET BY MOUTH ONE TIME DAILY 03/17/13  Yes Vernie Shanks, MD  cromolyn (OPTICROM) 4 % ophthalmic solution  04/22/12  Yes Historical Provider, MD  furosemide (LASIX) 20 MG tablet TAKE ONE TABLET BY MOUTH ONE TIME DAILY   Yes Vernie Shanks, MD  glimepiride (AMARYL) 2 MG tablet TAKE ONE TABLET BY MOUTH ONE TIME DAILY BEFORE BREAKFAST   Yes Vernie Shanks, MD  levocetirizine (XYZAL) 5 MG tablet TAKE 1 TABLET (5 MG TOTAL) BY MOUTH EVERY EVENING.   Yes Vernie Shanks, MD  nebivolol (BYSTOLIC) 5 MG tablet Take 5 mg by mouth daily.   Yes Historical Provider, MD  omeprazole (PRILOSEC) 40 MG capsule Take 40 mg by mouth daily.   Yes Historical Provider, MD  travoprost, benzalkonium, (TRAVATAN) 0.004 % ophthalmic solution Place 1 drop into both eyes at bedtime.   Yes Historical Provider, MD  ursodiol (ACTIGALL) 300 MG capsule TAKE ONE CAPSULE BY MOUTH TWICE DAILY 06/29/11  Yes Inda Castle, MD  warfarin (COUMADIN) 3 MG tablet TAKE 1 AND 1/2 TABLETS BY MOUTH ON SUNDAYS AND THURSDAYS, THEN 1 TABLET DAILY ON ALL OTHER DAYS   Yes Vernie Shanks, MD     ROS: As above in the HPI. All other systems are stable or negative.  OBJECTIVE: APPEARANCE:  Patient in no acute distress.The patient appeared well nourished and normally developed. Acyanotic. Waist: VITAL SIGNS:BP 162/69  Pulse 76  Temp(Src) 97 F (36.1 C) (Oral)  Ht 5\' 3"  (1.6 m)  Wt 114 lb (51.71 kg)  BMI 20.20 kg/m2 Elderly frail WF. Mucus membranes pink. SKIN: warm and  Dry without overt rashes, tattoos and scars  HEAD and Neck: without JVD, Head and scalp: normal Eyes:No scleral icterus. Fundi normal, eye movements normal. Ears: Auricle normal, canal normal, Tympanic membranes normal, insufflation normal. Nose: normal Throat: normal Neck & thyroid: normal  CHEST & LUNGS: Chest wall: normal Lungs: Clear  CVS: Reveals the PMI to be normally located. Regular rhythm, First and Second Heart sounds are normal,  absence of murmurs, rubs or gallops. Peripheral vasculature: Radial  pulses: normal  ABDOMEN:  Appearance: normal Benign, no organomegaly, no masses, no Abdominal Aortic enlargement. No Guarding , no rebound. No Bruits. Bowel sounds: normal  RECTAL: No masses. Heme negative brown stools GU: N/A  EXTREMETIES: nonedematous.  NEUROLOGIC: oriented to,place and person; nonfocal. Cranial Nerves  are normal.  ASSESSMENT:  Abnormal laboratory test - Plan: POCT CBC Lab error on the initial hemoglobin Patient treated with Vitamin K.  PLAN:  Orders Placed This Encounter  Procedures  . POCT CBC   Results for orders placed in visit on 06/13/13  POCT CBC      Result Value Ref Range   WBC 7.4  4.6 - 10.2 K/uL   Lymph, poc 1.3  0.6 - 3.4   POC LYMPH PERCENT 18.0  10 - 50 %L   POC Granulocyte 5.9  2 - 6.9   Granulocyte percent 80.1 (*) 37 - 80 %G   RBC 4.2  4.04 - 5.48 M/uL   Hemoglobin 12.4  12.2 - 16.2 g/dL   HCT, POC 38.4  37.7 - 47.9 %   MCV 91.4  80 - 97 fL   MCH, POC 29.5  27 - 31.2 pg   MCHC 32.3  31.8 - 35.4 g/dL   RDW, POC 15.6     Platelet Count, POC 129.0 (*) 142 - 424 K/uL   MPV 9.4  0 - 99.8 fL  observe.   No orders of the defined types were placed in this encounter.   Medications Discontinued During This Encounter  Medication Reason  . Vitamin D, Ergocalciferol, (DRISDOL) 50000 UNITS CAPS Completed Course  . polyethylene glycol powder (GLYCOLAX/MIRALAX) powder Patient Preference   Return in about 1 day (around 06/14/2013) for recheck protime. to ED stat if any obvious bleeding occurs.  Matraca Hunkins P. Jacelyn Grip, M.D.

## 2013-06-14 ENCOUNTER — Ambulatory Visit (INDEPENDENT_AMBULATORY_CARE_PROVIDER_SITE_OTHER): Payer: Medicare Other | Admitting: Pharmacist

## 2013-06-14 DIAGNOSIS — I4891 Unspecified atrial fibrillation: Secondary | ICD-10-CM

## 2013-06-14 LAB — POCT INR: INR: 1.8

## 2013-06-14 NOTE — Patient Instructions (Signed)
Anticoagulation Dose Instructions as of 06/14/2013     Stacey Brown Tue Wed Thu Fri Sat   New Dose 3 mg 1.5 mg 3 mg 3 mg 3 mg 1.5 mg 3 mg    Description       Restart warfarin today at new dose of 1/2 tablet on Mondays and Fridays.  One (1) tablet all other days.      INR was 1.8 today

## 2013-06-20 ENCOUNTER — Ambulatory Visit (INDEPENDENT_AMBULATORY_CARE_PROVIDER_SITE_OTHER): Payer: Medicare Other | Admitting: Pharmacist Clinician (PhC)/ Clinical Pharmacy Specialist

## 2013-06-20 DIAGNOSIS — I4891 Unspecified atrial fibrillation: Secondary | ICD-10-CM

## 2013-06-20 LAB — POCT INR: INR: 1.7

## 2013-07-06 ENCOUNTER — Other Ambulatory Visit: Payer: Self-pay | Admitting: *Deleted

## 2013-07-06 MED ORDER — GLIMEPIRIDE 2 MG PO TABS: ORAL_TABLET | ORAL | Status: DC

## 2013-07-18 ENCOUNTER — Ambulatory Visit (INDEPENDENT_AMBULATORY_CARE_PROVIDER_SITE_OTHER): Payer: Medicare Other | Admitting: Pharmacist Clinician (PhC)/ Clinical Pharmacy Specialist

## 2013-07-18 DIAGNOSIS — I4891 Unspecified atrial fibrillation: Secondary | ICD-10-CM

## 2013-07-18 LAB — POCT INR: INR: 4

## 2013-07-18 MED ORDER — APIXABAN 2.5 MG PO TABS: 2.5000 mg | ORAL_TABLET | Freq: Two times a day (BID) | ORAL | Status: DC

## 2013-07-20 ENCOUNTER — Encounter: Payer: Self-pay | Admitting: General Practice

## 2013-07-20 ENCOUNTER — Other Ambulatory Visit: Payer: Medicare Other

## 2013-07-20 ENCOUNTER — Ambulatory Visit: Payer: Self-pay | Admitting: Pharmacist

## 2013-07-20 ENCOUNTER — Ambulatory Visit (INDEPENDENT_AMBULATORY_CARE_PROVIDER_SITE_OTHER): Payer: Medicare Other | Admitting: General Practice

## 2013-07-20 VITALS — BP 107/57 | HR 78 | Temp 98.2°F | Ht 63.0 in | Wt 114.0 lb

## 2013-07-20 DIAGNOSIS — S51812A Laceration without foreign body of left forearm, initial encounter: Secondary | ICD-10-CM

## 2013-07-20 DIAGNOSIS — I4891 Unspecified atrial fibrillation: Secondary | ICD-10-CM

## 2013-07-20 DIAGNOSIS — S51809A Unspecified open wound of unspecified forearm, initial encounter: Secondary | ICD-10-CM

## 2013-07-20 LAB — POCT INR: INR: 2.2

## 2013-07-20 NOTE — Patient Instructions (Addendum)
Anticoagulation Dose Instructions as of 07/20/2013     Stacey Brown Tue Wed Thu Fri Sat   New Dose 3 mg 1.5 mg 3 mg 3 mg 3 mg 3 mg 3 mg    Description       Hold warfarin today 07/20/13.    Start Eliquis 2.5mg  take 1 tablet twcie a day starting tomorrow 07/21/13.      INR was 2.2 today  Since Eliquis needs prior authorization - will start Xarelto 15mg  take 1 tablet with the evening meal (gave #14 samples) until we can further investigate which medication the insurance will prefer or until prior authorization of Eliquis is completed.

## 2013-07-20 NOTE — Progress Notes (Signed)
Since Eliquis needs prior authorization - will start Xarelto 15mg  take 1 tablet with the evening meal until we can further investigate which medication the insurance will prefer or until prior authorization of Eliquis is completed.

## 2013-07-22 NOTE — Progress Notes (Signed)
   Subjective:    Patient ID: Stacey Brown, female    DOB: 1922/09/10, 80 y.o.   MRN: [id number]  HPI Patient presents today with complaints of skin tear to left forearm. Reports onset was lastnight. Injury occurred from hitting arm on bedroom door. Patient reports cleaning with water and peroxide at time of incident, then wrapping with guaze dressing. Denies numbness, tingling, or discoloration in hand or fingers. Reports Eliquis.     Review of Systems  Constitutional: Negative for fever and chills.  Respiratory: Negative for chest tightness and shortness of breath.   Cardiovascular: Negative for chest pain and palpitations.  Skin: Positive for wound.       Skin tear to left forearm  Neurological: Negative for dizziness and weakness.       Objective:   Physical Exam  Constitutional: She appears well-developed and well-nourished.  Cardiovascular: Normal rate, regular rhythm and normal heart sounds.   Pulmonary/Chest: Effort normal and breath sounds normal. No respiratory distress. She exhibits no tenderness.  Neurological: She is alert.  Skin: Skin is warm and dry.  Skin tear noted to left lower anterior forearm, measuring 1 inch x 2 inches, minimal bleeding. Small skin flap noted. Moderate bruising noted to area. Negative s/s of infection.   Psychiatric: She has a normal mood and affect.          Assessment & Plan:   1. Skin tear of left forearm without complication  Cleansed area with normal saline, used sterile cotton tips to approximate skin edges, applied steri strips, and covered with telfa guaze, secured with coban. -Patient tolerated well -leave dressing in tomorrow, change telfa twice daily -keep clean and dry -follow up on Monday and sooner if symptoms discussed develop or bleeding begins -patient and daughter verbalized understanding

## 2013-07-24 ENCOUNTER — Encounter: Payer: Self-pay | Admitting: Family

## 2013-07-24 ENCOUNTER — Ambulatory Visit (INDEPENDENT_AMBULATORY_CARE_PROVIDER_SITE_OTHER): Payer: Medicare Other | Admitting: Family

## 2013-07-24 VITALS — BP 114/53 | HR 65 | Temp 97.8°F | Wt 114.6 lb

## 2013-07-24 DIAGNOSIS — IMO0002 Reserved for concepts with insufficient information to code with codable children: Secondary | ICD-10-CM

## 2013-07-24 DIAGNOSIS — T148XXA Other injury of unspecified body region, initial encounter: Secondary | ICD-10-CM

## 2013-07-24 NOTE — Progress Notes (Signed)
   Subjective:    Patient ID: Stacey Brown, female    DOB: 1922/12/22, 81 y.o.   MRN: [id number]  HPI Pt presents to office for follow-up for a wound on left wrist/arm. Pt states she was opening a door and her wrist/arm hit door causing bruising and a skin tear. Pt states she is not having any pain only when she has to use it to "push myself up". Pt denies any fever or drainage.    Review of Systems  HENT: Negative.   Respiratory: Negative.   Cardiovascular: Negative.   Genitourinary: Negative.   Musculoskeletal: Negative.   Neurological: Negative.   All other systems reviewed and are negative.      Objective:   Physical Exam  Vitals reviewed. Constitutional: She is oriented to person, place, and time. She appears well-developed and well-nourished. No distress.  Cardiovascular: Normal rate, regular rhythm, normal heart sounds and intact distal pulses.   No murmur heard. Pulmonary/Chest: Effort normal and breath sounds normal. No respiratory distress. She has no wheezes.  Abdominal: Soft. Bowel sounds are normal. She exhibits no distension. There is no tenderness.  Musculoskeletal: Normal range of motion. She exhibits no edema and no tenderness.  Neurological: She is alert and oriented to person, place, and time. She has normal reflexes. No cranial nerve deficit.  Skin: Skin is warm and dry. There is erythema.  Skin tear with ecchymosis and erythemas base Approx 6 ins. No drainage or puss.  Psychiatric: She has a normal mood and affect. Her behavior is normal. Judgment and thought content normal.    BP 114/53  Pulse 65  Temp(Src) 97.8 F (36.6 C) (Oral)  Wt 114 lb 9.6 oz (51.982 kg)       Assessment & Plan:  1. Skin tear -S/s of infection discussed-Pt educated on importance of reporting any redness, drainage, or new pain to provider -Tylenol prn for pain -Neosporin and new gauze on area daily -Be careful not to hit or re injure area RTO prn  Evelina Dun, FNP

## 2013-07-24 NOTE — Patient Instructions (Signed)
Skin Tear Care  A skin tear is a wound in which the top layer of skin has peeled off. This is a common problem with aging because the skin becomes thinner and more fragile as a person gets older. In addition, some medicines, such as oral corticosteroids, can lead to skin thinning if taken for long periods of time.   A skin tear is often repaired with tape or skin adhesive strips. This keeps the skin that has been peeled off in contact with the healthier skin beneath. Depending on the location of the wound, a bandage (dressing) may be applied over the tape or skin adhesive strips. Sometimes, during the healing process, the skin turns black and dies. Even when this happens, the torn skin acts as a good dressing until the skin underneath gets healthier and repairs itself.  HOME CARE INSTRUCTIONS   · Change dressings once per day or as directed by your caregiver.  · Gently clean the skin tear and the area around the tear using saline solution or mild soap and water.  · Do not rub the injured skin dry. Let the area air dry.  · Apply petroleum jelly or an antibiotic cream or ointment to keep the tear moist. This will help the wound heal. Do not allow a scab to form.  · If the dressing sticks before the next dressing change, moisten it with warm soapy water and gently remove it.  · Protect the injured skin until it has healed.  · Only take over-the-counter or prescription medicines as directed by your caregiver.  · Take showers or baths using warm soapy water. Apply a new dressing after the shower or bath.  · Keep all follow-up appointments as directed by your caregiver.    SEEK IMMEDIATE MEDICAL CARE IF:   · You have redness, swelling, or increasing pain in the skin tear.  · You have pus coming from the skin tear.  · You have chills.  · You have a red streak that goes away from the skin tear.  · You have a bad smell coming from the tear or dressing.  · You have a fever or persistent symptoms for more than 2 3 days.  · You  have a fever and your symptoms suddenly get worse.  MAKE SURE YOU:  · Understand these instructions.  · Will watch this condition.  · Will get help right away if your child is not doing well or gets worse.  Document Released: 10/21/2000 Document Revised: 10/21/2011 Document Reviewed: 08/10/2011  [employer]® Patient Information ©2014 [employer].

## 2013-08-02 ENCOUNTER — Other Ambulatory Visit: Payer: Self-pay | Admitting: Family Medicine

## 2013-08-03 NOTE — Telephone Encounter (Signed)
Patient notified at last refill that NTBS. Was seen in office for acute visit but not labs done. Please advise

## 2013-08-03 NOTE — Telephone Encounter (Signed)
Please make an appointment for patient to be seen and give her enough pills to lasts until that appointment

## 2013-08-22 ENCOUNTER — Other Ambulatory Visit: Payer: Self-pay | Admitting: Pharmacist

## 2013-08-22 MED ORDER — DENOSUMAB 60 MG/ML ~~LOC~~ SOLN: 60.0000 mg | Freq: Once | SUBCUTANEOUS | Status: DC

## 2013-08-22 NOTE — Telephone Encounter (Signed)
prolia injection due - cost in office if 20% of medication cost.  Previously I think patient got at pharmacy and was less.   i called Rx to [facility] for prolia and asked them to let me know if PA needed.

## 2013-09-01 ENCOUNTER — Telehealth: Payer: Self-pay | Admitting: Pharmacist

## 2013-09-01 NOTE — Telephone Encounter (Signed)
patietn due Prolia Injectioin - PA approved through [employer] and it has been filled at [facility] - patient to pick up prior to appt 09/04/13 and bring to office for injection.

## 2013-09-03 ENCOUNTER — Other Ambulatory Visit: Payer: Self-pay | Admitting: Family Medicine

## 2013-09-04 ENCOUNTER — Other Ambulatory Visit: Payer: Self-pay | Admitting: Pharmacist

## 2013-09-04 ENCOUNTER — Encounter: Payer: Self-pay | Admitting: Family

## 2013-09-04 ENCOUNTER — Ambulatory Visit (INDEPENDENT_AMBULATORY_CARE_PROVIDER_SITE_OTHER): Payer: Medicare Other | Admitting: Family

## 2013-09-04 VITALS — BP 116/72 | HR 91 | Temp 98.2°F | Ht 63.0 in | Wt 114.6 lb

## 2013-09-04 DIAGNOSIS — M810 Age-related osteoporosis without current pathological fracture: Secondary | ICD-10-CM

## 2013-09-04 DIAGNOSIS — E1159 Type 2 diabetes mellitus with other circulatory complications: Secondary | ICD-10-CM | POA: Insufficient documentation

## 2013-09-04 DIAGNOSIS — E559 Vitamin D deficiency, unspecified: Secondary | ICD-10-CM

## 2013-09-04 DIAGNOSIS — E785 Hyperlipidemia, unspecified: Secondary | ICD-10-CM

## 2013-09-04 DIAGNOSIS — H1030 Unspecified acute conjunctivitis, unspecified eye: Secondary | ICD-10-CM

## 2013-09-04 DIAGNOSIS — M129 Arthropathy, unspecified: Secondary | ICD-10-CM

## 2013-09-04 DIAGNOSIS — N189 Chronic kidney disease, unspecified: Secondary | ICD-10-CM

## 2013-09-04 DIAGNOSIS — E119 Type 2 diabetes mellitus without complications: Secondary | ICD-10-CM

## 2013-09-04 DIAGNOSIS — M1990 Unspecified osteoarthritis, unspecified site: Secondary | ICD-10-CM

## 2013-09-04 DIAGNOSIS — I10 Essential (primary) hypertension: Secondary | ICD-10-CM

## 2013-09-04 LAB — POCT UA - MICROALBUMIN: Microalbumin Ur, POC: 20 mg/L

## 2013-09-04 MED ORDER — POLYMYXIN B-TRIMETHOPRIM 10000-0.1 UNIT/ML-% OP SOLN: 1.0000 [drp] | Freq: Four times a day (QID) | OPHTHALMIC | Status: DC

## 2013-09-04 MED ORDER — DENOSUMAB 60 MG/ML ~~LOC~~ SOLN
60.0000 mg | Freq: Once | SUBCUTANEOUS | Status: AC
  Administered 2013-09-04: 60 mg via SUBCUTANEOUS

## 2013-09-04 NOTE — Addendum Note (Signed)
Addended by: Earlene Plater on: 09/04/2013 04:26 PM   Modules accepted: Orders

## 2013-09-04 NOTE — Progress Notes (Signed)
Subjective:    Patient ID: Stacey Brown, female    DOB: [birth year], 80 y.o.   MRN: [id number]  Diabetes She presents for her follow-up diabetic visit. She has type 2 diabetes mellitus. Her disease course has been improving. Hypoglycemia symptoms include dizziness. Pertinent negatives for hypoglycemia include no confusion, headaches or mood changes. Associated symptoms include foot paresthesias. Pertinent negatives for diabetes include no blurred vision, no chest pain, no foot ulcerations and no visual change. Symptoms are improving. Diabetic complications include peripheral neuropathy. Pertinent negatives for diabetic complications include no CVA or heart disease. Risk factors for coronary artery disease include diabetes mellitus, dyslipidemia, family history, hypertension and post-menopausal. Current diabetic treatment includes oral agent (monotherapy). She is compliant with treatment all of the time. She is following a diabetic diet. Her breakfast blood glucose range is generally 90-110 mg/dl. Eye exam is current.  Hypertension This is a chronic problem. The current episode started more than 1 year ago. The problem has been resolved since onset. The problem is controlled. Associated symptoms include shortness of breath. Pertinent negatives include no blurred vision, chest pain, headaches or palpitations. Risk factors for coronary artery disease include diabetes mellitus, dyslipidemia and post-menopausal state. Past treatments include calcium channel blockers and beta blockers. The current treatment provides significant improvement. Hypertensive end-organ damage includes kidney disease. There is no history of CAD/MI, CVA, heart failure or a thyroid problem. There is no history of sleep apnea.  Hyperlipidemia This is a chronic problem. The current episode started more than 1 year ago. The problem is uncontrolled. Recent lipid tests were reviewed and are high. Exacerbating diseases include diabetes. She  has no history of hypothyroidism. Factors aggravating her hyperlipidemia include fatty foods. Associated symptoms include shortness of breath. Pertinent negatives include no chest pain, leg pain or myalgias. Current antihyperlipidemic treatment includes statins. The current treatment provides moderate improvement of lipids. Risk factors for coronary artery disease include diabetes mellitus, dyslipidemia, hypertension, post-menopausal, a sedentary lifestyle and family history.  Gastrophageal Reflux She complains of heartburn. She reports no chest pain, no coughing or no sore throat. This is a chronic problem. The current episode started more than 1 year ago. The problem occurs frequently. The problem has been waxing and waning. The symptoms are aggravated by certain foods and lying down. She has tried a PPI for the symptoms. The treatment provided mild relief.      Review of Systems  Constitutional: Negative.   HENT: Negative.  Negative for sore throat.   Eyes: Negative.  Negative for blurred vision.  Respiratory: Positive for shortness of breath. Negative for cough.   Cardiovascular: Negative.  Negative for chest pain and palpitations.  Gastrointestinal: Positive for heartburn.  Endocrine: Negative.   Genitourinary: Negative.   Musculoskeletal: Negative.  Negative for myalgias.  Neurological: Positive for dizziness. Negative for headaches.  Hematological: Negative.   Psychiatric/Behavioral: Negative.  Negative for confusion.  All other systems reviewed and are negative.      Objective:   Physical Exam  Vitals reviewed. Constitutional: She is oriented to person, place, and time. She appears well-developed and well-nourished. No distress.  HENT:  Head: Normocephalic and atraumatic.  Right Ear: External ear normal.  Mouth/Throat: Oropharynx is clear and moist.  Eyes: Pupils are equal, round, and reactive to light. Right eye exhibits discharge. Left eye exhibits discharge.  Neck: Normal  range of motion. Neck supple. No thyromegaly present.  Cardiovascular: Normal rate, normal heart sounds and intact distal pulses.   No murmur heard. Irregular  regular rhythm   Pulmonary/Chest: Effort normal and breath sounds normal. No respiratory distress. She has no wheezes.  Abdominal: Soft. Bowel sounds are normal. She exhibits no distension. There is no tenderness.  Musculoskeletal: Normal range of motion. She exhibits edema (2+ edema in bilateral ankles). She exhibits no tenderness.  Neurological: She is alert and oriented to person, place, and time. She has normal reflexes. No cranial nerve deficit.  Skin: Skin is warm and dry.  Psychiatric: She has a normal mood and affect. Her behavior is normal. Judgment and thought content normal.    BP 116/72  Pulse 91  Temp(Src) 98.2 F (36.8 C) (Oral)  Ht '5\' 3"'  (1.6 m)  Wt 114 lb 9.6 oz (51.982 kg)  BMI 20.31 kg/m2       Assessment & Plan:  1. Diabetes mellitus without complication - POCT UA - Microalbumin - CMP14+EGFR  2. Arthritis  3. Osteoporosis  4. Chronic kidney disease, unspecified stage  5. Unspecified vitamin D deficiency - Vit D  25 hydroxy (rtn osteoporosis monitoring)  6. Hyperlipidemia - Lipid panel  7. Essential hypertension, benign - CMP14+EGFR  8. Acute conjunctivitis - trimethoprim-polymyxin b (POLYTRIM) ophthalmic solution; Place 1 drop into both eyes every 6 (six) hours.  Dispense: 10 mL; Refill: 1  Continue all meds Labs pending Health Maintenance reviewed Diet and exercise encouraged RTO 3 months   Evelina Dun, FNP

## 2013-09-04 NOTE — Patient Instructions (Addendum)
Health Maintenance Adopting a healthy lifestyle and getting preventive care can go a long way to promote health and wellness. Talk with your health care provider about what schedule of regular examinations is right for you. This is a good chance for you to check in with your provider about disease prevention and staying healthy. In between checkups, there are plenty of things you can do on your own. Experts have done a lot of research about which lifestyle changes and preventive measures are most likely to keep you healthy. Ask your health care provider for more information. WEIGHT AND DIET  Eat a healthy diet  Be sure to include plenty of vegetables, fruits, low-fat dairy products, and lean protein.  Do not eat a lot of foods high in solid fats, added sugars, or salt.  Get regular exercise. This is one of the most important things you can do for your health.  Most adults should exercise for at least 150 minutes each week. The exercise should increase your heart rate and make you sweat (moderate-intensity exercise).  Most adults should also do strengthening exercises at least twice a week. This is in addition to the moderate-intensity exercise.  Maintain a healthy weight  Body mass index (BMI) is a measurement that can be used to identify possible weight problems. It estimates body fat based on height and weight. Your health care provider can help determine your BMI and help you achieve or maintain a healthy weight.  For females 25 years of age and older:   A BMI below 18.5 is considered underweight.  A BMI of 18.5 to 24.9 is normal.  A BMI of 25 to 29.9 is considered overweight.  A BMI of 30 and above is considered obese.  Watch levels of cholesterol and blood lipids  You should start having your blood tested for lipids and cholesterol at 78 years of age, then have this test every 5 years.  You may need to have your cholesterol levels checked more often if:  Your lipid or  cholesterol levels are high.  You are older than 78 years of age.  You are at high risk for heart disease.  CANCER SCREENING   Lung Cancer  Lung cancer screening is recommended for adults 97-92 years old who are at high risk for lung cancer because of a history of smoking.  A yearly low-dose CT scan of the lungs is recommended for people who:  Currently smoke.  Have quit within the past 15 years.  Have at least a 30-pack-year history of smoking. A pack year is smoking an average of one pack of cigarettes a day for 1 year.  Yearly screening should continue until it has been 15 years since you quit.  Yearly screening should stop if you develop a health problem that would prevent you from having lung cancer treatment.  Breast Cancer  Practice breast self-awareness. This means understanding how your breasts normally appear and feel.  It also means doing regular breast self-exams. Let your health care provider know about any changes, no matter how small.  If you are in your 20s or 30s, you should have a clinical breast exam (CBE) by a health care provider every 1-3 years as part of a regular health exam.  If you are 76 or older, have a CBE every year. Also consider having a breast X-ray (mammogram) every year.  If you have a family history of breast cancer, talk to your health care provider about genetic screening.  If you are  at high risk for breast cancer, talk to your health care provider about having an MRI and a mammogram every year.  Breast cancer gene (BRCA) assessment is recommended for women who have family members with BRCA-related cancers. BRCA-related cancers include:  Breast.  Ovarian.  Tubal.  Peritoneal cancers.  Results of the assessment will determine the need for genetic counseling and BRCA1 and BRCA2 testing. Cervical Cancer Routine pelvic examinations to screen for cervical cancer are no longer recommended for nonpregnant women who are considered low  risk for cancer of the pelvic organs (ovaries, uterus, and vagina) and who do not have symptoms. A pelvic examination may be necessary if you have symptoms including those associated with pelvic infections. Ask your health care provider if a screening pelvic exam is right for you.   The Pap test is the screening test for cervical cancer for women who are considered at risk.  If you had a hysterectomy for a problem that was not cancer or a condition that could lead to cancer, then you no longer need Pap tests.  If you are older than 65 years, and you have had normal Pap tests for the past 10 years, you no longer need to have Pap tests.  If you have had past treatment for cervical cancer or a condition that could lead to cancer, you need Pap tests and screening for cancer for at least 20 years after your treatment.  If you no longer get a Pap test, assess your risk factors if they change (such as having a new sexual partner). This can affect whether you should start being screened again.  Some women have medical problems that increase their chance of getting cervical cancer. If this is the case for you, your health care provider may recommend more frequent screening and Pap tests.  The human papillomavirus (HPV) test is another test that may be used for cervical cancer screening. The HPV test looks for the virus that can cause cell changes in the cervix. The cells collected during the Pap test can be tested for HPV.  The HPV test can be used to screen women 30 years of age and older. Getting tested for HPV can extend the interval between normal Pap tests from three to five years.  An HPV test also should be used to screen women of any age who have unclear Pap test results.  After 78 years of age, women should have HPV testing as often as Pap tests.  Colorectal Cancer  This type of cancer can be detected and often prevented.  Routine colorectal cancer screening usually begins at 78 years of  age and continues through 78 years of age.  Your health care provider may recommend screening at an earlier age if you have risk factors for colon cancer.  Your health care provider may also recommend using home test kits to check for hidden blood in the stool.  A small camera at the end of a tube can be used to examine your colon directly (sigmoidoscopy or colonoscopy). This is done to check for the earliest forms of colorectal cancer.  Routine screening usually begins at age 50.  Direct examination of the colon should be repeated every 5-10 years through 78 years of age. However, you may need to be screened more often if early forms of precancerous polyps or small growths are found. Skin Cancer  Check your skin from head to toe regularly.  Tell your health care provider about any new moles or changes in   moles, especially if there is a change in a mole's shape or color.  Also tell your health care provider if you have a mole that is larger than the size of a pencil eraser.  Always use sunscreen. Apply sunscreen liberally and repeatedly throughout the day.  Protect yourself by wearing long sleeves, pants, a wide-brimmed hat, and sunglasses whenever you are outside. HEART DISEASE, DIABETES, AND HIGH BLOOD PRESSURE   Have your blood pressure checked at least every 1-2 years. High blood pressure causes heart disease and increases the risk of stroke.  If you are between 75 years and 42 years old, ask your health care provider if you should take aspirin to prevent strokes.  Have regular diabetes screenings. This involves taking a blood sample to check your fasting blood sugar level.  If you are at a normal weight and have a low risk for diabetes, have this test once every three years after 78 years of age.  If you are overweight and have a high risk for diabetes, consider being tested at a younger age or more often. PREVENTING INFECTION  Hepatitis B  If you have a higher risk for  hepatitis B, you should be screened for this virus. You are considered at high risk for hepatitis B if:  You were born in a country where hepatitis B is common. Ask your health care provider which countries are considered high risk.  Your parents were born in a high-risk country, and you have not been immunized against hepatitis B (hepatitis B vaccine).  You have HIV or AIDS.  You use needles to inject street drugs.  You live with someone who has hepatitis B.  You have had sex with someone who has hepatitis B.  You get hemodialysis treatment.  You take certain medicines for conditions, including cancer, organ transplantation, and autoimmune conditions. Hepatitis C  Blood testing is recommended for:  Everyone born from [birth year] through 1963.  Anyone with known risk factors for hepatitis C. Sexually transmitted infections (STIs)  You should be screened for sexually transmitted infections (STIs) including gonorrhea and chlamydia if:  You are sexually active and are younger than 78 years of age.  You are older than 78 years of age and your health care provider tells you that you are at risk for this type of infection.  Your sexual activity has changed since you were last screened and you are at an increased risk for chlamydia or gonorrhea. Ask your health care provider if you are at risk.  If you do not have HIV, but are at risk, it may be recommended that you take a prescription medicine daily to prevent HIV infection. This is called pre-exposure prophylaxis (PrEP). You are considered at risk if:  You are sexually active and do not regularly use condoms or know the HIV status of your partner(s).  You take drugs by injection.  You are sexually active with a partner who has HIV. Talk with your health care provider about whether you are at high risk of being infected with HIV. If you choose to begin PrEP, you should first be tested for HIV. You should then be tested every 3 months for  as long as you are taking PrEP.  PREGNANCY   If you are premenopausal and you may become pregnant, ask your health care provider about preconception counseling.  If you may become pregnant, take 400 to 800 micrograms (mcg) of folic acid every day.  If you want to prevent pregnancy, talk to your  health care provider about birth control (contraception). OSTEOPOROSIS AND MENOPAUSE   Osteoporosis is a disease in which the bones lose minerals and strength with aging. This can result in serious bone fractures. Your risk for osteoporosis can be identified using a bone density scan.  If you are 74 years of age or older, or if you are at risk for osteoporosis and fractures, ask your health care provider if you should be screened.  Ask your health care provider whether you should take a calcium or vitamin D supplement to lower your risk for osteoporosis.  Menopause may have certain physical symptoms and risks.  Hormone replacement therapy may reduce some of these symptoms and risks. Talk to your health care provider about whether hormone replacement therapy is right for you.  HOME CARE INSTRUCTIONS   Schedule regular health, dental, and eye exams.  Stay current with your immunizations.   Do not use any tobacco products including cigarettes, chewing tobacco, or electronic cigarettes.  If you are pregnant, do not drink alcohol.  If you are breastfeeding, limit how much and how often you drink alcohol.  Limit alcohol intake to no more than 1 drink per day for nonpregnant women. One drink equals 12 ounces of beer, 5 ounces of wine, or 1 ounces of hard liquor.  Do not use street drugs.  Do not share needles.  Ask your health care provider for help if you need support or information about quitting drugs.  Tell your health care provider if you often feel depressed.  Tell your health care provider if you have ever been abused or do not feel safe at home. Document Released: 08/11/2010  Document Revised: 06/12/2013 Document Reviewed: 12/28/2012 [facility] Patient Information 2015 [place], [place]. This information is not intended to replace advice given to you by your health care provider. Make sure you discuss any questions you have with your health care provider. Conjunctivitis Conjunctivitis is commonly called "pink eye." Conjunctivitis can be caused by bacterial or viral infection, allergies, or injuries. There is usually redness of the lining of the eye, itching, discomfort, and sometimes discharge. There may be deposits of matter along the eyelids. A viral infection usually causes a watery discharge, while a bacterial infection causes a yellowish, thick discharge. Pink eye is very contagious and spreads by direct contact. You may be given antibiotic eyedrops as part of your treatment. Before using your eye medicine, remove all drainage from the eye by washing gently with warm water and cotton balls. Continue to use the medication until you have awakened 2 mornings in a row without discharge from the eye. Do not rub your eye. This increases the irritation and helps spread infection. Use separate towels from other household members. Wash your hands with soap and water before and after touching your eyes. Use cold compresses to reduce pain and sunglasses to relieve irritation from light. Do not wear contact lenses or wear eye makeup until the infection is gone. SEEK MEDICAL CARE IF:   Your symptoms are not better after 3 days of treatment.  You have increased pain or trouble seeing.  The outer eyelids become very red or swollen. Document Released: 03/05/2004 Document Revised: 04/20/2011 Document Reviewed: 01/26/2005 [facility] Patient Information 2015 [place], [place]. This information is not intended to replace advice given to you by your health care provider. Make sure you discuss any questions you have with your health care provider.

## 2013-09-05 ENCOUNTER — Other Ambulatory Visit: Payer: Self-pay | Admitting: *Deleted

## 2013-09-05 LAB — CMP14+EGFR
A/G RATIO: 2 (ref 1.1–2.5)
ALBUMIN: 4.4 g/dL (ref 3.5–4.7)
ALK PHOS: 76 IU/L (ref 39–117)
ALT: 11 IU/L (ref 0–32)
AST: 23 IU/L (ref 0–40)
BILIRUBIN TOTAL: 0.4 mg/dL (ref 0.0–1.2)
BUN / CREAT RATIO: 21 (ref 11–26)
BUN: 23 mg/dL (ref 8–27)
CHLORIDE: 102 mmol/L (ref 97–108)
CO2: 20 mmol/L (ref 18–29)
Calcium: 9.6 mg/dL (ref 8.7–10.3)
Creatinine, Ser: 1.12 mg/dL — ABNORMAL HIGH (ref 0.57–1.00)
GFR calc Af Amer: 51 mL/min/{1.73_m2} — ABNORMAL LOW (ref >59)
GFR, EST NON AFRICAN AMERICAN: 44 mL/min/{1.73_m2} — AB (ref >59)
GLOBULIN, TOTAL: 2.2 g/dL (ref 1.5–4.5)
Glucose: 143 mg/dL — ABNORMAL HIGH (ref 65–99)
Potassium: 4.6 mmol/L (ref 3.5–5.2)
Sodium: 145 mmol/L — ABNORMAL HIGH (ref 134–144)
Total Protein: 6.6 g/dL (ref 6.0–8.5)

## 2013-09-05 LAB — LIPID PANEL
CHOLESTEROL TOTAL: 138 mg/dL (ref 100–199)
Chol/HDL Ratio: 4.8 ratio units — ABNORMAL HIGH (ref 0.0–4.4)
HDL: 29 mg/dL — ABNORMAL LOW (ref >39)
LDL Calculated: 41 mg/dL (ref 0–99)
Triglycerides: 340 mg/dL — ABNORMAL HIGH (ref 0–149)
VLDL CHOLESTEROL CAL: 68 mg/dL — AB (ref 5–40)

## 2013-09-05 LAB — MICROALBUMIN, URINE: Microalbumin, Urine: 15.1 ug/mL (ref 0.0–17.0)

## 2013-09-05 LAB — VITAMIN D 25 HYDROXY (VIT D DEFICIENCY, FRACTURES): Vit D, 25-Hydroxy: 24.9 ng/mL — ABNORMAL LOW (ref 30.0–100.0)

## 2013-09-05 MED ORDER — LEVOCETIRIZINE DIHYDROCHLORIDE 5 MG PO TABS: ORAL_TABLET | ORAL | Status: DC

## 2013-09-08 ENCOUNTER — Other Ambulatory Visit: Payer: Self-pay | Admitting: Family

## 2013-09-08 MED ORDER — ROSUVASTATIN CALCIUM 40 MG PO TABS
40.0000 mg | ORAL_TABLET | Freq: Every day | ORAL | Status: DC
Start: 2013-09-08 — End: 2014-09-04

## 2013-09-08 MED ORDER — VITAMIN D (ERGOCALCIFEROL) 1.25 MG (50000 UNIT) PO CAPS: 50000.0000 [IU] | ORAL_CAPSULE | ORAL | Status: DC

## 2013-09-25 ENCOUNTER — Other Ambulatory Visit: Payer: Self-pay | Admitting: *Deleted

## 2013-09-25 MED ORDER — FUROSEMIDE 20 MG PO TABS: ORAL_TABLET | ORAL | Status: DC

## 2013-09-27 ENCOUNTER — Ambulatory Visit: Payer: Medicare Other

## 2013-09-27 ENCOUNTER — Other Ambulatory Visit: Payer: Medicare Other

## 2013-10-27 ENCOUNTER — Telehealth: Payer: Self-pay | Admitting: Family

## 2013-10-27 NOTE — Telephone Encounter (Signed)
appt given for wed with christy

## 2013-11-01 ENCOUNTER — Encounter: Payer: Self-pay | Admitting: Family

## 2013-11-01 ENCOUNTER — Ambulatory Visit (INDEPENDENT_AMBULATORY_CARE_PROVIDER_SITE_OTHER): Payer: Medicare Other | Admitting: Family

## 2013-11-01 VITALS — BP 190/70 | HR 76 | Temp 96.6°F | Ht 63.0 in | Wt 118.0 lb

## 2013-11-01 DIAGNOSIS — Z23 Encounter for immunization: Secondary | ICD-10-CM

## 2013-11-01 DIAGNOSIS — L8992 Pressure ulcer of unspecified site, stage 2: Secondary | ICD-10-CM

## 2013-11-01 DIAGNOSIS — L8990 Pressure ulcer of unspecified site, unspecified stage: Secondary | ICD-10-CM

## 2013-11-01 DIAGNOSIS — B369 Superficial mycosis, unspecified: Secondary | ICD-10-CM

## 2013-11-01 MED ORDER — MENTHOL-ZINC OXIDE 0.44-20.6 % EX OINT: TOPICAL_OINTMENT | CUTANEOUS | Status: DC

## 2013-11-01 MED ORDER — NYSTATIN 100000 UNIT/GM EX POWD: CUTANEOUS | Status: DC

## 2013-11-01 NOTE — Progress Notes (Signed)
   Subjective:    Patient ID: Stacey Brown, female    DOB: [birth year], 80 y.o.   MRN: [id number]  Rash This is a new problem. The current episode started 1 to 4 weeks ago. The problem is unchanged. The affected locations include the left lower leg, right lower leg, chest and back. The rash is characterized by itchiness and pain. Pertinent negatives include no congestion, cough, diarrhea, fever, joint pain, shortness of breath or sore throat. Past treatments include moisturizer. The treatment provided mild relief. There is no history of allergies or asthma.      Review of Systems  Constitutional: Negative.  Negative for fever.  HENT: Negative.  Negative for congestion and sore throat.   Eyes: Negative.   Respiratory: Negative.  Negative for cough and shortness of breath.   Cardiovascular: Negative.  Negative for palpitations.  Gastrointestinal: Negative.  Negative for diarrhea.  Endocrine: Negative.   Genitourinary: Negative.   Musculoskeletal: Negative.  Negative for joint pain.  Skin: Positive for rash.  Neurological: Negative.  Negative for headaches.  Hematological: Negative.   Psychiatric/Behavioral: Negative.   All other systems reviewed and are negative.      Objective:   Physical Exam  Vitals reviewed. Constitutional: She is oriented to person, place, and time. She appears well-developed and well-nourished. No distress.  Eyes: Pupils are equal, round, and reactive to light.  Neck: Normal range of motion. Neck supple. No thyromegaly present.  Cardiovascular: Normal rate, regular rhythm, normal heart sounds and intact distal pulses.   No murmur heard. Pulmonary/Chest: Effort normal and breath sounds normal. No respiratory distress. She has no wheezes.  Abdominal: Soft. Bowel sounds are normal. She exhibits no distension. There is no tenderness.  Musculoskeletal: Normal range of motion. She exhibits no edema and no tenderness.  Neurological: She is alert and oriented to  person, place, and time. She has normal reflexes. No cranial nerve deficit.  Skin: Skin is warm and dry. Rash noted.  Stage 2 pressure ulcer on right gluteal fold  Generalized erythema fungal rash under bilateral breast  Psychiatric: She has a normal mood and affect. Her behavior is normal. Judgment and thought content normal.      BP 190/70  Pulse 76  Temp(Src) 96.6 F (35.9 C) (Oral)  Ht 5\' 3"  (1.6 m)  Wt 118 lb (53.524 kg)  BMI 20.91 kg/m2     Assessment & Plan:  1. Need for vaccination with 13-polyvalent pneumococcal conjugate vaccine - Pneumococcal conjugate vaccine 13-valent IM  2. Fungal skin infection -Keep area clean and dry - nystatin (MYCOSTATIN/NYSTOP) 100000 UNIT/GM POWD; Use BID under both breast  Dispense: 30 g; Refill: 3  3. Pressure ulcer stage II -Pt needs to reposition every 2 hours -Pt needs to sleep on sides and with pillows- Allow time off back/butt -RTO prn - Menthol-Zinc Oxide (CALMOSEPTINE) 0.44-20.6 % OINT; Use BID on bottom and as needed  Dispense: 71 g; Refill: [place], FNP

## 2013-11-01 NOTE — Patient Instructions (Signed)
Pressure Ulcer A pressure ulcer is a sore that has formed from the breakdown of skin and exposure of deeper layers of tissue. It develops in areas of the body where there is unrelieved pressure. Pressure ulcers are usually found over a bony area, such as the shoulder blades, spine, lower back, hips, knees, ankles, and heels. Pressure ulcers vary in severity. Your health care provider may determine the severity (stage) of your pressure ulcer. The stages include:  Stage I--The skin is red, and when the skin is pressed, it stays red.  Stage II--The top layer of skin is gone, and there is a shallow, pink ulcer.  Stage III--The ulcer becomes deeper, and it is more difficult to see the whole wound. Also, there may be yellow or brown parts, as well as pink and red parts.  Stage IV--The ulcer may be deep and red, pink, brown, white, or yellow. Bone or muscle may be seen.  Unstageable pressure ulcer--The ulcer is covered almost completely with black, brown, or yellow tissue. It is not known how deep the ulcer is or what stage it is until this covering comes off.  Suspected deep tissue injury--A person's skin can be injured from pressure or pulling on the skin when his or her position is changed. The skin appears purple or maroon. There may not be an opening in the skin, but there could be a blood-filled blister. This deep tissue injury is often difficult to see in people with darker skin tones. The site may open and become deeper in time. However, early interventions will help the area heal and may prevent the area from opening. CAUSES  Pressure ulcers are caused by pressure against the skin that limits the flow of blood to the skin and nearby tissues. There are many risk factors that can lead to pressure sores. RISK FACTORS  Decreased ability to move.  Decreased ability to feel pain or discomfort.  Excessive skin moisture from urine, stool, sweat, or secretions.  Poor  nutrition.  Dehydration.  Tobacco, drug, or alcohol abuse.  Having someone pull on bedsheets that are under you, such as when health care workers are changing your position in a hospital bed.  Obesity.  Increased adult age.  Hospitalization in a critical care unit for longer than 4 days with use of medical devices.  Prolonged use of medical devices.  Critical illness.  Anemia.  Traumatic brain injury.  Spinal cord injury.  Stroke.  Diabetes.  Poor blood glucose control.  Low blood pressure (hypotension).  Low oxygen levels.  Medicines that reduce blood flow.  Infection. DIAGNOSIS  Your health care provider will diagnose your pressure ulcer based on its appearance. The health care provider may determine the stage of your pressure ulcer as well. Tests may be done to check for infection, to assess your circulation, or to check for other diseases, such as diabetes. TREATMENT  Treatment of your pressure ulcer begins with determining what stage the ulcer is in. Your treatment team may include your health care provider, a wound care specialist, a nutritionist, a physical therapist, and a surgeon. Possible treatments may include:   Moving or repositioning every 1-2 hours.  Using beds or mattresses to shift your body weight and pressure points frequently.  Improving your diet.  Cleaning and bandaging (dressing) the open wound.  Giving antibiotic medicines.  Removing damaged tissue.  Surgery and sometimes skin grafts. HOME CARE INSTRUCTIONS  If you were hospitalized, follow the care plan that was started in the hospital.    Avoid staying in the same position for more than 2 hours. Use padding, devices, or mattresses to cushion your pressure points as directed by your health care provider.  Eat a well-balanced diet. Take nutritional supplements and vitamins as directed by your health care provider.  Keep all follow-up appointments.  Only take over-the-counter or  prescription medicines for pain, fever, or discomfort as directed by your health care provider. SEEK MEDICAL CARE IF:   Your pressure ulcer is not improving.  You do not know how to care for your pressure ulcer.  You notice other areas of redness on your skin.  You have a fever. SEEK IMMEDIATE MEDICAL CARE IF:   You have increasing redness, swelling, or pain in your pressure ulcer.  You notice pus coming from your pressure ulcer.  You notice a bad smell coming from the wound or dressing.  Your pressure ulcer opens up again. Document Released: 01/26/2005 Document Revised: 01/31/2013 Document Reviewed: 10/03/2012 [employer] Patient Information 2015 [employer]. This information is not intended to replace advice given to you by your health care provider. Make sure you discuss any questions you have with your health care provider.  

## 2013-11-28 ENCOUNTER — Other Ambulatory Visit: Payer: Self-pay | Admitting: Family

## 2013-12-06 ENCOUNTER — Other Ambulatory Visit: Payer: Self-pay | Admitting: Family Medicine

## 2013-12-14 DIAGNOSIS — Z029 Encounter for administrative examinations, unspecified: Secondary | ICD-10-CM

## 2013-12-26 ENCOUNTER — Other Ambulatory Visit: Payer: Self-pay | Admitting: Family

## 2014-01-03 ENCOUNTER — Other Ambulatory Visit: Payer: Self-pay | Admitting: *Deleted

## 2014-01-03 ENCOUNTER — Other Ambulatory Visit: Payer: Medicare Other

## 2014-01-03 ENCOUNTER — Ambulatory Visit (INDEPENDENT_AMBULATORY_CARE_PROVIDER_SITE_OTHER): Payer: Medicare Other | Admitting: Pharmacist

## 2014-01-03 ENCOUNTER — Ambulatory Visit (INDEPENDENT_AMBULATORY_CARE_PROVIDER_SITE_OTHER): Payer: Medicare Other

## 2014-01-03 VITALS — Ht 63.0 in | Wt 120.0 lb

## 2014-01-03 DIAGNOSIS — Z79899 Other long term (current) drug therapy: Secondary | ICD-10-CM

## 2014-01-03 DIAGNOSIS — M810 Age-related osteoporosis without current pathological fracture: Secondary | ICD-10-CM

## 2014-01-03 DIAGNOSIS — M8080XD Other osteoporosis with current pathological fracture, unspecified site, subsequent encounter for fracture with routine healing: Secondary | ICD-10-CM

## 2014-01-03 LAB — POCT CBC
Granulocyte percent: 72 %G (ref 37–80)
HEMATOCRIT: 40.3 % (ref 37.7–47.9)
HEMOGLOBIN: 12.8 g/dL (ref 12.2–16.2)
Lymph, poc: 1.6 (ref 0.6–3.4)
MCH: 28.7 pg (ref 27–31.2)
MCHC: 31.7 g/dL — AB (ref 31.8–35.4)
MCV: 90.7 fL (ref 80–97)
MPV: 8 fL (ref 0–99.8)
POC Granulocyte: 4.9 (ref 2–6.9)
POC LYMPH PERCENT: 22.9 %L (ref 10–50)
Platelet Count, POC: 163 10*3/uL (ref 142–424)
RBC: 4.5 M/uL (ref 4.04–5.48)
RDW, POC: 15.5 %
WBC: 6.8 10*3/uL (ref 4.6–10.2)

## 2014-01-03 LAB — HM DEXA SCAN

## 2014-01-03 NOTE — Patient Instructions (Addendum)
Change to ivory soap to wash face or a cream wash.  Also may need creamier / thicker facial lotion in the winter.  Fall Prevention and Home Safety Falls cause injuries and can affect all age groups. It is possible to use preventive measures to significantly decrease the likelihood of falls. There are many simple measures which can make your home safer and prevent falls. OUTDOORS  Repair cracks and edges of walkways and driveways.  Remove high doorway thresholds.  Trim shrubbery on the main path into your home.  Have good outside lighting.  Clear walkways of tools, rocks, debris, and clutter.  Check that handrails are not broken and are securely fastened. Both sides of steps should have handrails.  Have leaves, snow, and ice cleared regularly.  Use sand or salt on walkways during winter months.  In the garage, clean up grease or oil spills. BATHROOM  Install night lights.  Install grab bars by the toilet and in the tub and shower.  Use non-skid mats or decals in the tub or shower.  Place a plastic non-slip stool in the shower to sit on, if needed.  Keep floors dry and clean up all water on the floor immediately.  Remove soap buildup in the tub or shower on a regular basis.  Secure bath mats with non-slip, double-sided rug tape.  Remove throw rugs and tripping hazards from the floors. BEDROOMS  Install night lights.  Make sure a bedside light is easy to reach.  Do not use oversized bedding.  Keep a telephone by your bedside.  Have a firm chair with side arms to use for getting dressed.  Remove throw rugs and tripping hazards from the floor. KITCHEN  Keep handles on pots and pans turned toward the center of the stove. Use back burners when possible.  Clean up spills quickly and allow time for drying.  Avoid walking on wet floors.  Avoid hot utensils and knives.  Position shelves so they are not too high or low.  Place commonly used objects within easy  reach.  If necessary, use a sturdy step stool with a grab bar when reaching.  Keep electrical cables out of the way.  Do not use floor polish or wax that makes floors slippery. If you must use wax, use non-skid floor wax.  Remove throw rugs and tripping hazards from the floor. STAIRWAYS  Never leave objects on stairs.  Place handrails on both sides of stairways and use them. Fix any loose handrails. Make sure handrails on both sides of the stairways are as long as the stairs.  Check carpeting to make sure it is firmly attached along stairs. Make repairs to worn or loose carpet promptly.  Avoid placing throw rugs at the top or bottom of stairways, or properly secure the rug with carpet tape to prevent slippage. Get rid of throw rugs, if possible.  Have an electrician put in a light switch at the top and bottom of the stairs. OTHER FALL PREVENTION TIPS  Wear low-heel or rubber-soled shoes that are supportive and fit well. Wear closed toe shoes.  When using a stepladder, make sure it is fully opened and both spreaders are firmly locked. Do not climb a closed stepladder.  Add color or contrast paint or tape to grab bars and handrails in your home. Place contrasting color strips on first and last steps.  Learn and use mobility aids as needed. Install an electrical emergency response system.  Turn on lights to avoid dark areas. Replace  light bulbs that burn out immediately. Get light switches that glow.  Arrange furniture to create clear pathways. Keep furniture in the same place.  Firmly attach carpet with non-skid or double-sided tape.  Eliminate uneven floor surfaces.  Select a carpet pattern that does not visually hide the edge of steps.  Be aware of all pets. OTHER HOME SAFETY TIPS  Set the water temperature for 120 F (48.8 C).  Keep emergency numbers on or near the telephone.  Keep smoke detectors on every level of the home and near sleeping areas. Document Released:  01/16/2002 Document Revised: 07/28/2011 Document Reviewed: 04/17/2011 [facility] Patient Information 2015 [place], [place]. This information is not intended to replace advice given to you by your health care provider. Make sure you discuss any questions you have with your health care provider.
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
## 2014-01-27 ENCOUNTER — Ambulatory Visit (INDEPENDENT_AMBULATORY_CARE_PROVIDER_SITE_OTHER): Payer: Medicare Other | Admitting: Family Medicine

## 2014-01-27 VITALS — BP 207/99 | HR 92 | Temp 97.5°F | Ht 63.0 in | Wt 119.0 lb

## 2014-01-27 DIAGNOSIS — N390 Urinary tract infection, site not specified: Secondary | ICD-10-CM

## 2014-01-27 DIAGNOSIS — R509 Fever, unspecified: Secondary | ICD-10-CM

## 2014-01-27 DIAGNOSIS — R5383 Other fatigue: Secondary | ICD-10-CM

## 2014-01-27 DIAGNOSIS — L821 Other seborrheic keratosis: Secondary | ICD-10-CM

## 2014-01-27 LAB — POCT UA - MICROSCOPIC ONLY
Bacteria, U Microscopic: NEGATIVE
Casts, Ur, LPF, POC: NEGATIVE
Crystals, Ur, HPF, POC: NEGATIVE
Mucus, UA: NEGATIVE
Yeast, UA: NEGATIVE

## 2014-01-27 LAB — POCT CBC
Granulocyte percent: 68 %G (ref 37–80)
HCT, POC: 41.4 % (ref 37.7–47.9)
Hemoglobin: 13.1 g/dL (ref 12.2–16.2)
Lymph, poc: 2 (ref 0.6–3.4)
MCH, POC: 28.8 pg (ref 27–31.2)
MCHC: 31.5 g/dL — AB (ref 31.8–35.4)
MCV: 91.2 fL (ref 80–97)
MPV: 8.2 fL (ref 0–99.8)
POC Granulocyte: 5 (ref 2–6.9)
POC LYMPH PERCENT: 27.2 %L (ref 10–50)
Platelet Count, POC: 168 10*3/uL (ref 142–424)
RBC: 4.5 M/uL (ref 4.04–5.48)
RDW, POC: 16 %
WBC: 7.4 10*3/uL (ref 4.6–10.2)

## 2014-01-27 LAB — POCT URINALYSIS DIPSTICK
Bilirubin, UA: NEGATIVE
Glucose, UA: NEGATIVE
Ketones, UA: NEGATIVE
Nitrite, UA: NEGATIVE
Protein, UA: NEGATIVE
Spec Grav, UA: 1.01
Urobilinogen, UA: NEGATIVE
pH, UA: 6

## 2014-01-27 LAB — GLUCOSE, POCT (MANUAL RESULT ENTRY): POC Glucose: 82 mg/dl (ref 70–99)

## 2014-01-27 MED ORDER — CIPROFLOXACIN HCL 250 MG PO TABS: 250.0000 mg | ORAL_TABLET | Freq: Two times a day (BID) | ORAL | Status: DC

## 2014-01-27 NOTE — Progress Notes (Signed)
Subjective:    Patient ID: Stacey Brown, female    DOB: 23-Sep-1922, 80 y.o.   MRN: [id number]  HPI Patient presents with c/o hot flashes or fever like sx's at night.  She c/o congestion and sputum production when she drinks water.  She c/o skin lesions under her breasts bilateral.  Review of Systems  Constitutional: Negative for fever.  HENT: Negative for ear pain.   Eyes: Negative for discharge.  Respiratory: Negative for cough.   Cardiovascular: Negative for chest pain.  Gastrointestinal: Negative for abdominal distention.  Endocrine: Negative for polyuria.  Genitourinary: Negative for difficulty urinating.  Musculoskeletal: Negative for gait problem and neck pain.  Skin: Negative for color change and rash.  Neurological: Negative for speech difficulty and headaches.  Psychiatric/Behavioral: Negative for agitation.   Results for orders placed or performed in visit on 01/27/14  POCT CBC  Result Value Ref Range   WBC 7.4 4.6 - 10.2 K/uL   Lymph, poc 2.0 0.6 - 3.4   POC LYMPH PERCENT 27.2 10 - 50 %L   MID (cbc)  0 - 0.9   POC MID %  0 - 12 %M   POC Granulocyte 5.0 2 - 6.9   Granulocyte percent 68.0 37 - 80 %G   RBC 4.5 4.04 - 5.48 M/uL   Hemoglobin 13.1 12.2 - 16.2 g/dL   HCT, POC 41.4 37.7 - 47.9 %   MCV 91.2 80 - 97 fL   MCH, POC 28.8 27 - 31.2 pg   MCHC 31.5 (A) 31.8 - 35.4 g/dL   RDW, POC 16.0 %   Platelet Count, POC 168.0 142 - 424 K/uL   MPV 8.2 0 - 99.8 fL  POCT urinalysis dipstick  Result Value Ref Range   Color, UA yellow    Clarity, UA cloudy    Glucose, UA neg    Bilirubin, UA neg    Ketones, UA neg    Spec Grav, UA 1.010    Blood, UA mod    pH, UA 6.0    Protein, UA neg    Urobilinogen, UA negative    Nitrite, UA neg    Leukocytes, UA moderate (2+)   POCT UA - Microscopic Only  Result Value Ref Range   WBC, Ur, HPF, POC 10-15    RBC, urine, microscopic 5-7    Bacteria, U Microscopic neg    Mucus, UA neg    Epithelial cells, urine per micros  occ    Crystals, Ur, HPF, POC neg    Casts, Ur, LPF, POC neg    Yeast, UA neg       Objective:    BP 207/99 mmHg  Pulse 92  Temp(Src) 97.5 F (36.4 C) (Oral)  Ht 5\' 3"  (1.6 m)  Wt 119 lb (53.978 kg)  BMI 21.09 kg/m2 Physical Exam  Constitutional: She is oriented to person, place, and time. She appears well-developed and well-nourished.  HENT:  Head: Normocephalic and atraumatic.  Mouth/Throat: Oropharynx is clear and moist.  Eyes: Pupils are equal, round, and reactive to light.  Neck: Normal range of motion. Neck supple.  Cardiovascular: Normal rate and regular rhythm.   No murmur heard. Pulmonary/Chest: Effort normal and breath sounds normal.  Abdominal: Soft. Bowel sounds are normal. There is no tenderness.  Neurological: She is alert and oriented to person, place, and time.  Skin: Skin is warm and dry.  SK under bilateral breasts  Psychiatric: She has a normal mood and affect.  Assessment & Plan:     ICD-9-CM ICD-10-CM   1. Other fatigue 780.79 R53.83 POCT CBC     POCT urinalysis dipstick     POCT UA - Microscopic Only     POCT glucose (manual entry)  2. Other specified fever 780.60 R50.9 POCT CBC     POCT urinalysis dipstick     POCT UA - Microscopic Only  3. Urinary tract infection without hematuria, site unspecified 599.0 N39.0 ciprofloxacin (CIPRO) 250 MG tablet     Urine culture  4. SK (seborrheic keratosis) 702.19 L82.1    Reassurance given about SK  No Follow-up on file.  Lysbeth Penner FNP

## 2014-01-29 LAB — URINE CULTURE

## 2014-02-01 ENCOUNTER — Other Ambulatory Visit: Payer: Self-pay | Admitting: Family Medicine

## 2014-02-12 ENCOUNTER — Other Ambulatory Visit (INDEPENDENT_AMBULATORY_CARE_PROVIDER_SITE_OTHER): Payer: Medicaid Other

## 2014-02-12 DIAGNOSIS — R300 Dysuria: Secondary | ICD-10-CM

## 2014-02-12 DIAGNOSIS — I739 Peripheral vascular disease, unspecified: Secondary | ICD-10-CM | POA: Diagnosis not present

## 2014-02-12 LAB — POCT UA - MICROSCOPIC ONLY
Bacteria, U Microscopic: NEGATIVE
Casts, Ur, LPF, POC: NEGATIVE
Crystals, Ur, HPF, POC: NEGATIVE
Mucus, UA: NEGATIVE
RBC, urine, microscopic: NEGATIVE
WBC, Ur, HPF, POC: NEGATIVE
Yeast, UA: NEGATIVE

## 2014-02-12 LAB — POCT URINALYSIS DIPSTICK
Bilirubin, UA: NEGATIVE
Blood, UA: NEGATIVE
Glucose, UA: NEGATIVE
Ketones, UA: NEGATIVE
Leukocytes, UA: NEGATIVE
Nitrite, UA: NEGATIVE
Protein, UA: NEGATIVE
Spec Grav, UA: 1.01
Urobilinogen, UA: NEGATIVE
pH, UA: 6.5

## 2014-02-12 NOTE — Progress Notes (Signed)
Lab only 

## 2014-02-19 ENCOUNTER — Ambulatory Visit: Payer: Medicare Other | Admitting: Family

## 2014-02-23 ENCOUNTER — Telehealth: Payer: Self-pay | Admitting: Pharmacist

## 2014-02-23 NOTE — Telephone Encounter (Signed)
prolia injection due 03/07/2014.  Prior to ording from [facility] I wanted to verify that patient still wants to continue as she was concerned about side effects when we checked her BMD about 2 months ago.  Called patient but has to LM onVM.

## 2014-02-28 ENCOUNTER — Telehealth: Payer: Self-pay | Admitting: Pharmacist

## 2014-02-28 IMAGING — CR DG ABDOMEN 2V
2 series · 2 of 2 positions shown · non-contrast
Comparison: CT 05/25/2007

CLINICAL DATA: Abdominal pain

EXAM:
ABDOMEN - 2 VIEW

[view not recorded (1 of 2)]
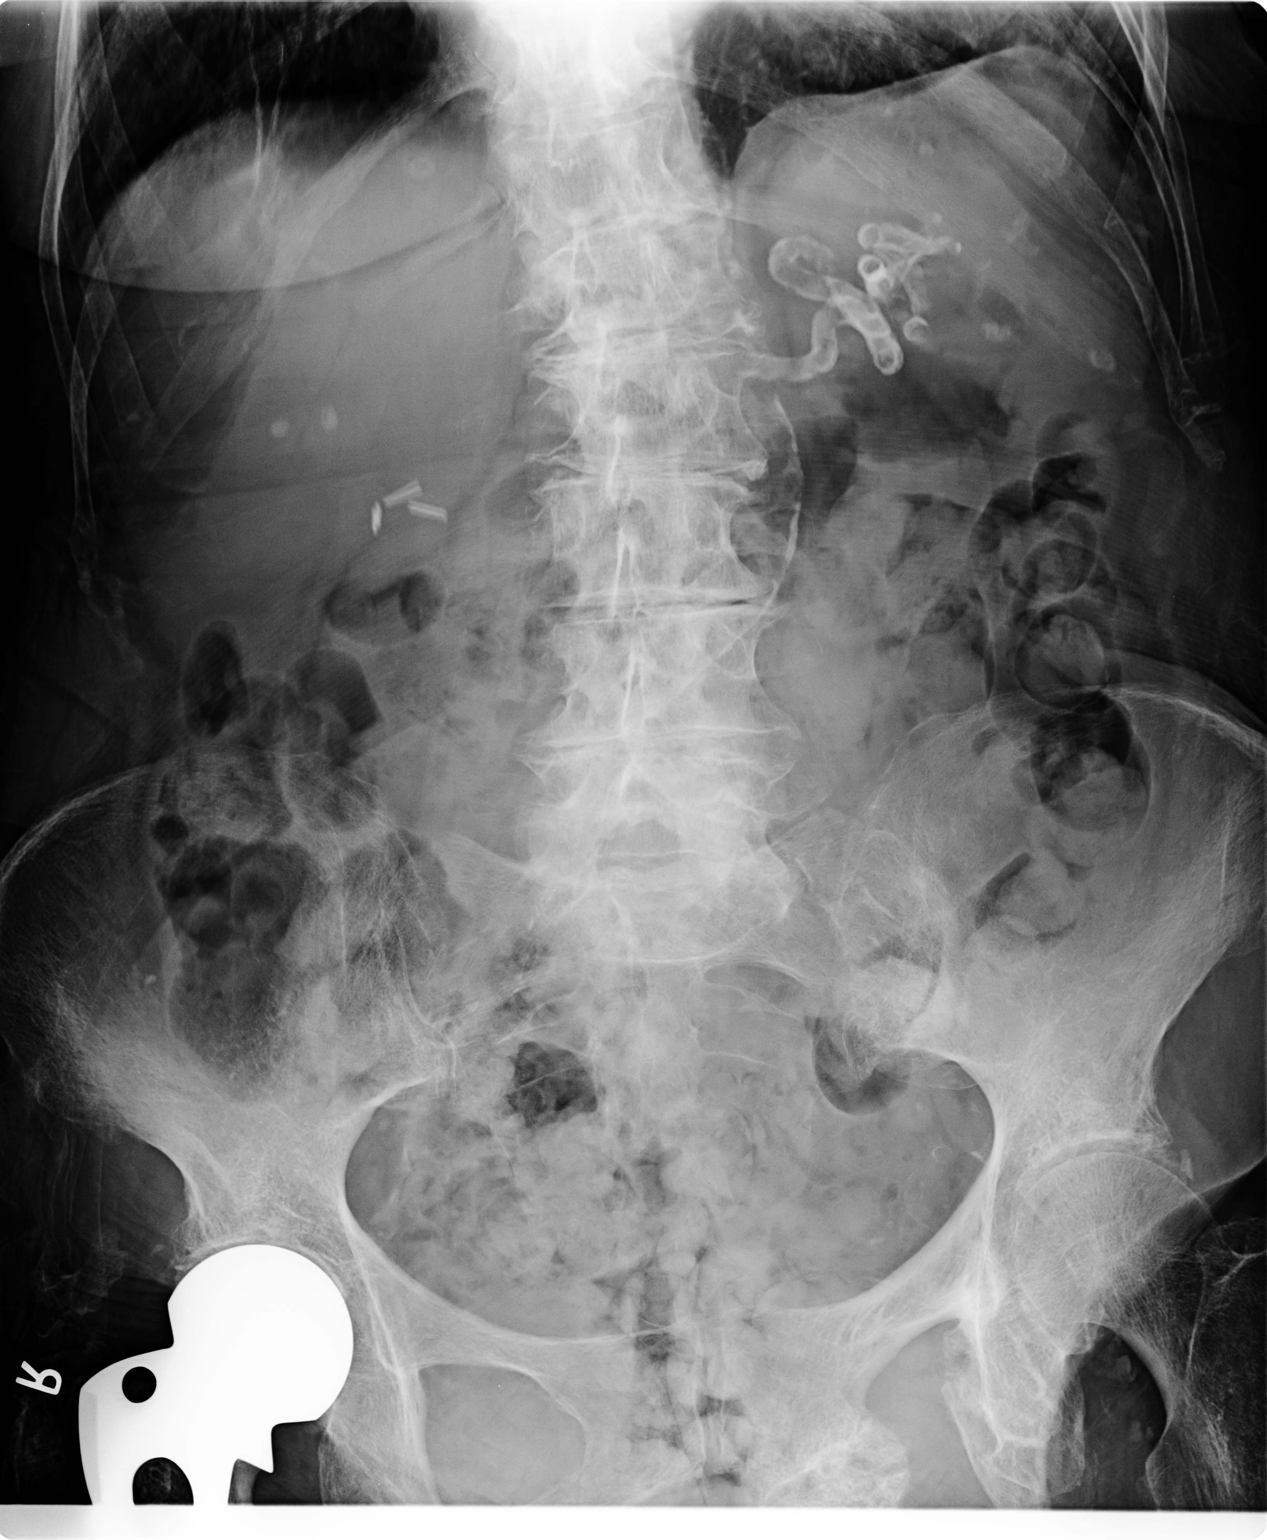

[view not recorded (2 of 2)]
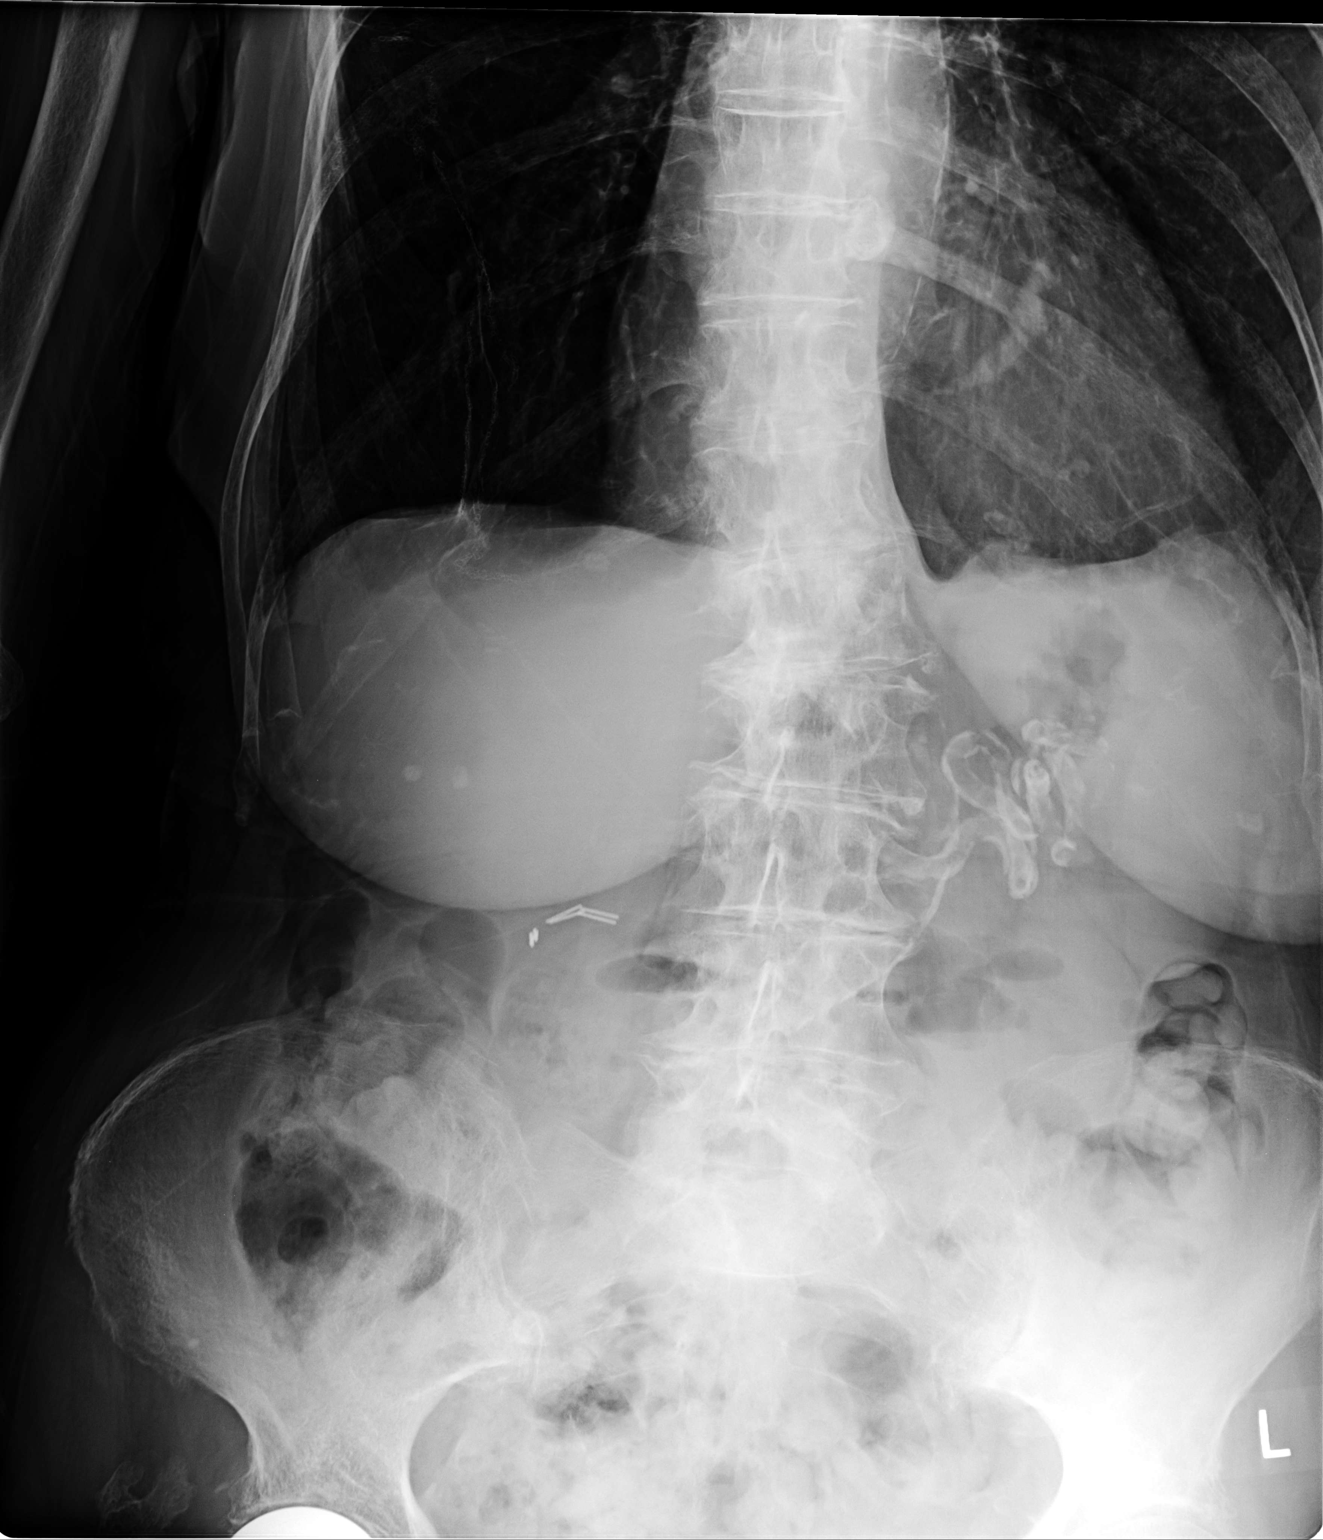

[2 of 2 positions shown; findings below may reference images not displayed]

FINDINGS: The bowel gas pattern is normal. There is no evidence of free air.
No radio-opaque calculi or other significant radiographic
abnormality is seen. Splenic arterial and aortic calcification and
ectasia noted. Cholecystectomy clips are noted. Right total hip
arthroplasty partly visualized. Moderate stool volume noted
throughout nondilated colon. Right upper quadrant calcifications
most likely reflect material within colonic diverticuli.
Hyperinflation of the lung bases may indicate emphysema,
incompletely evaluated. Lumbar spine degenerative change with mild
leftward curvature centered at L2 noted. Left inferior pubic ramus
chronic deformity is identified.
IMPRESSION: Moderate stool volume throughout nondilated colon. Nonobstructive
bowel gas pattern.

## 2014-02-28 NOTE — Telephone Encounter (Signed)
Tried to call again about prolia order - left message on VM

## 2014-03-05 ENCOUNTER — Other Ambulatory Visit: Payer: Self-pay | Admitting: Family Medicine

## 2014-03-09 NOTE — Telephone Encounter (Signed)
Called patient because she is due Prolia. She stated in past visit that she was not sure she wanted to continue because she thought Prolia was affecting her skin / causing dryness.  Patient has decided not to continue Prolia.  Oral bisphosphonates not good alternative due to history of esophageal structure and dysphagia.  Could consider Reclast.  Also could try Evista.  Will discuss at appt Monday, Feb 1st.

## 2014-03-09 NOTE — Telephone Encounter (Signed)
Returning Tammy's call for [facility].  Call Alba Destine daughter at [phone].

## 2014-03-09 NOTE — Telephone Encounter (Signed)
I had called patient because she is due Prolia.  She stated in past visit that she was not sure she wanted to continue because she thought Prolia was affecting her skin / causing dryness.   Patient has decided not to continue Prolia.  Oral bisphosphonates not good alternative due to history of esophageal structure and dysphagia.   Could consider Reclast.  Also could try Evista.  Will discuss at appt Monday, Feb 1st.

## 2014-03-12 ENCOUNTER — Ambulatory Visit (INDEPENDENT_AMBULATORY_CARE_PROVIDER_SITE_OTHER): Payer: Medicare Other | Admitting: Family

## 2014-03-12 ENCOUNTER — Encounter: Payer: Self-pay | Admitting: Family

## 2014-03-12 DIAGNOSIS — E559 Vitamin D deficiency, unspecified: Secondary | ICD-10-CM

## 2014-03-12 DIAGNOSIS — N189 Chronic kidney disease, unspecified: Secondary | ICD-10-CM

## 2014-03-12 DIAGNOSIS — E119 Type 2 diabetes mellitus without complications: Secondary | ICD-10-CM

## 2014-03-12 DIAGNOSIS — M1990 Unspecified osteoarthritis, unspecified site: Secondary | ICD-10-CM

## 2014-03-12 DIAGNOSIS — I10 Essential (primary) hypertension: Secondary | ICD-10-CM | POA: Diagnosis not present

## 2014-03-12 DIAGNOSIS — I4891 Unspecified atrial fibrillation: Secondary | ICD-10-CM

## 2014-03-12 DIAGNOSIS — E785 Hyperlipidemia, unspecified: Secondary | ICD-10-CM

## 2014-03-12 DIAGNOSIS — M129 Arthropathy, unspecified: Secondary | ICD-10-CM | POA: Diagnosis not present

## 2014-03-12 LAB — POCT GLYCOSYLATED HEMOGLOBIN (HGB A1C): Hemoglobin A1C: 7.1

## 2014-03-12 MED ORDER — AMLODIPINE BESYLATE 5 MG PO TABS
5.0000 mg | ORAL_TABLET | Freq: Every day | ORAL | Status: DC
Start: 2014-03-12 — End: 2015-05-03

## 2014-03-12 MED ORDER — NEBIVOLOL HCL 5 MG PO TABS: 5.0000 mg | ORAL_TABLET | Freq: Every day | ORAL | Status: DC

## 2014-03-12 NOTE — Progress Notes (Signed)
Subjective:    Patient ID: Stacey Brown, female    DOB: 07-26-1922, 81 y.o.   MRN: [id number]  Diabetes She presents for her follow-up diabetic visit. She has type 2 diabetes mellitus. Her disease course has been improving. Hypoglycemia symptoms include dizziness. Pertinent negatives for hypoglycemia include no confusion, headaches or mood changes. Associated symptoms include foot paresthesias. Pertinent negatives for diabetes include no blurred vision, no chest pain, no foot ulcerations and no visual change. There are no hypoglycemic complications. Symptoms are improving. Diabetic complications include nephropathy and peripheral neuropathy. Pertinent negatives for diabetic complications include no CVA or heart disease. Risk factors for coronary artery disease include diabetes mellitus, dyslipidemia, family history, hypertension and post-menopausal. Current diabetic treatment includes oral agent (monotherapy). She is compliant with treatment all of the time. She is following a diabetic diet. Her breakfast blood glucose range is generally 90-110 mg/dl. An ACE inhibitor/angiotensin II receptor blocker is not being taken. Eye exam is current.  Hypertension This is a chronic problem. The current episode started more than 1 year ago. The problem has been rapidly worsening (Pt has not been taking Medicatin for HTN- she thought she was) since onset. The problem is uncontrolled. Associated symptoms include palpitations ("at times"), peripheral edema ("at times") and shortness of breath. Pertinent negatives include no blurred vision, chest pain or headaches. Risk factors for coronary artery disease include diabetes mellitus, dyslipidemia, post-menopausal state and family history. Past treatments include nothing (pt has not been taking bp medications). The current treatment provides no improvement. Hypertensive end-organ damage includes kidney disease. There is no history of CAD/MI, CVA, heart failure or a thyroid  problem. There is no history of sleep apnea.  Hyperlipidemia This is a chronic problem. The current episode started more than 1 year ago. The problem is controlled. Recent lipid tests were reviewed and are normal. Exacerbating diseases include diabetes. She has no history of hypothyroidism. Associated symptoms include shortness of breath. Pertinent negatives include no chest pain or myalgias. Current antihyperlipidemic treatment includes statins. The current treatment provides significant improvement of lipids. Risk factors for coronary artery disease include diabetes mellitus, dyslipidemia, family history, hypertension, a sedentary lifestyle and post-menopausal.      Review of Systems  Constitutional: Negative.   HENT: Negative.  Negative for sore throat.   Eyes: Negative.  Negative for blurred vision.  Respiratory: Positive for shortness of breath. Negative for cough.   Cardiovascular: Positive for palpitations ("at times"). Negative for chest pain.  Gastrointestinal: Negative.   Endocrine: Negative.   Genitourinary: Negative.   Musculoskeletal: Negative.  Negative for myalgias.  Neurological: Positive for dizziness. Negative for headaches.  Hematological: Negative.   Psychiatric/Behavioral: Negative.  Negative for confusion.  All other systems reviewed and are negative.      Objective:   Physical Exam  Constitutional: She is oriented to person, place, and time. She appears well-developed and well-nourished. No distress.  HENT:  Head: Normocephalic and atraumatic.  Right Ear: External ear normal.  Left Ear: External ear normal.  Nose: Nose normal.  Mouth/Throat: Oropharynx is clear and moist.  Eyes: Pupils are equal, round, and reactive to light.  Neck: Normal range of motion. Neck supple. No thyromegaly present.  Cardiovascular: Normal rate, regular rhythm, normal heart sounds and intact distal pulses.   No murmur heard. Pulmonary/Chest: Effort normal and breath sounds normal.  No respiratory distress. She has no wheezes.  Abdominal: Soft. Bowel sounds are normal. She exhibits no distension. There is no tenderness.  Musculoskeletal: Normal range  of motion. She exhibits edema and tenderness.  BLE 1+ edema, cool, and tender to palpation   Neurological: She is alert and oriented to person, place, and time. She has normal reflexes. No cranial nerve deficit.  Skin: Skin is warm and dry.  Psychiatric: She has a normal mood and affect. Her behavior is normal. Judgment and thought content normal.  Vitals reviewed.  BP 214/85 mmHg  Pulse 95  Temp(Src) 96.6 F (35.9 C) (Oral)  Ht '5\' 3"'  (1.6 m)  Wt 115 lb (52.164 kg)  BMI 20.38 kg/m2        Assessment & Plan:  1. Essential hypertension, benign -Dash diet information given -Exercise encouraged - Stress Management  -Continue current meds -RT) in 2 weeks  CMP14+EGFR - amLODipine (NORVASC) 5 MG tablet; Take 1 tablet (5 mg total) by mouth daily.  Dispense: 90 tablet; Refill: 3 - nebivolol (BYSTOLIC) 5 MG tablet; Take 1 tablet (5 mg total) by mouth daily.  Dispense: 90 tablet; Refill: 3  2. Atrial fibrillation, unspecified - CMP14+EGFR  3. Diabetes mellitus without complication - POCT glycosylated hemoglobin (Hb A1C) - CMP14+EGFR  4. Arthritis - CMP14+EGFR  5. Chronic kidney disease, unspecified stage - CMP14+EGFR  6. Vitamin D deficiency - CMP14+EGFR - Vit D  25 hydroxy (rtn osteoporosis monitoring)  7. Hyperlipidemia - CMP14+EGFR - Lipid panel   Continue all meds Labs pending Health Maintenance reviewed Diet and exercise encouraged RTO 2 weeks for BP  Stacey Dun, FNP

## 2014-03-12 NOTE — Patient Instructions (Signed)
DASH Eating Plan DASH stands for "Dietary Approaches to Stop Hypertension." The DASH eating plan is a healthy eating plan that has been shown to reduce high blood pressure (hypertension). Additional health benefits may include reducing the risk of type 2 diabetes mellitus, heart disease, and stroke. The DASH eating plan may also help with weight loss. WHAT DO I NEED TO KNOW ABOUT THE DASH EATING PLAN? For the DASH eating plan, you will follow these general guidelines:  Choose foods with a percent daily value for sodium of less than 5% (as listed on the food label).  Use salt-free seasonings or herbs instead of table salt or sea salt.  Check with your health care provider or pharmacist before using salt substitutes.  Eat lower-sodium products, often labeled as "lower sodium" or "no salt added."  Eat fresh foods.  Eat more vegetables, fruits, and low-fat dairy products.  Choose whole grains. Look for the word "whole" as the first word in the ingredient list.  Choose fish and skinless chicken or turkey more often than red meat. Limit fish, poultry, and meat to 6 oz (170 g) each day.  Limit sweets, desserts, sugars, and sugary drinks.  Choose heart-healthy fats.  Limit cheese to 1 oz (28 g) per day.  Eat more home-cooked food and less restaurant, buffet, and fast food.  Limit fried foods.  Cook foods using methods other than frying.  Limit canned vegetables. If you do use them, rinse them well to decrease the sodium.  When eating at a restaurant, ask that your food be prepared with less salt, or no salt if possible. WHAT FOODS CAN I EAT? Seek help from a dietitian for individual calorie needs. Grains Whole grain or whole wheat bread. Brown rice. Whole grain or whole wheat pasta. Quinoa, bulgur, and whole grain cereals. Low-sodium cereals. Corn or whole wheat flour tortillas. Whole grain cornbread. Whole grain crackers. Low-sodium crackers. Vegetables Fresh or frozen vegetables  (raw, steamed, roasted, or grilled). Low-sodium or reduced-sodium tomato and vegetable juices. Low-sodium or reduced-sodium tomato sauce and paste. Low-sodium or reduced-sodium canned vegetables.  Fruits All fresh, canned (in natural juice), or frozen fruits. Meat and Other Protein Products Ground beef (85% or leaner), grass-fed beef, or beef trimmed of fat. Skinless chicken or turkey. Ground chicken or turkey. Pork trimmed of fat. All fish and seafood. Eggs. Dried beans, peas, or lentils. Unsalted nuts and seeds. Unsalted canned beans. Dairy Low-fat dairy products, such as skim or 1% milk, 2% or reduced-fat cheeses, low-fat ricotta or cottage cheese, or plain low-fat yogurt. Low-sodium or reduced-sodium cheeses. Fats and Oils Tub margarines without trans fats. Light or reduced-fat mayonnaise and salad dressings (reduced sodium). Avocado. Safflower, olive, or canola oils. Natural peanut or almond butter. Other Unsalted popcorn and pretzels. The items listed above may not be a complete list of recommended foods or beverages. Contact your dietitian for more options. WHAT FOODS ARE NOT RECOMMENDED? Grains White bread. White pasta. White rice. Refined cornbread. Bagels and croissants. Crackers that contain trans fat. Vegetables Creamed or fried vegetables. Vegetables in a cheese sauce. Regular canned vegetables. Regular canned tomato sauce and paste. Regular tomato and vegetable juices. Fruits Dried fruits. Canned fruit in light or heavy syrup. Fruit juice. Meat and Other Protein Products Fatty cuts of meat. Ribs, chicken wings, bacon, sausage, bologna, salami, chitterlings, fatback, hot dogs, bratwurst, and packaged luncheon meats. Salted nuts and seeds. Canned beans with salt. Dairy Whole or 2% milk, cream, half-and-half, and cream cheese. Whole-fat or sweetened yogurt. Full-fat   cheeses or blue cheese. Nondairy creamers and whipped toppings. Processed cheese, cheese spreads, or cheese  curds. Condiments Onion and garlic salt, seasoned salt, table salt, and sea salt. Canned and packaged gravies. Worcestershire sauce. Tartar sauce. Barbecue sauce. Teriyaki sauce. Soy sauce, including reduced sodium. Steak sauce. Fish sauce. Oyster sauce. Cocktail sauce. Horseradish. Ketchup and mustard. Meat flavorings and tenderizers. Bouillon cubes. Hot sauce. Tabasco sauce. Marinades. Taco seasonings. Relishes. Fats and Oils Butter, stick margarine, lard, shortening, ghee, and bacon fat. Coconut, palm kernel, or palm oils. Regular salad dressings. Other Pickles and olives. Salted popcorn and pretzels. The items listed above may not be a complete list of foods and beverages to avoid. Contact your dietitian for more information. WHERE CAN I FIND MORE INFORMATION? National Heart, Lung, and [employer] Institute: www.nhlbi.nih.gov/health/health-topics/topics/dash/ Document Released: 01/15/2011 Document Revised: 06/12/2013 Document Reviewed: 11/30/2012 ExitCare Patient Information 2015 [employer]. This information is not intended to replace advice given to you by your health care provider. Make sure you discuss any questions you have with your health care provider. Hypertension Hypertension, commonly called high blood pressure, is when the force of blood pumping through your arteries is too strong. Your arteries are the blood vessels that carry blood from your heart throughout your body. A blood pressure reading consists of a higher number over a lower number, such as 110/72. The higher number (systolic) is the pressure inside your arteries when your heart pumps. The lower number (diastolic) is the pressure inside your arteries when your heart relaxes. Ideally you want your blood pressure below 120/80. Hypertension forces your heart to work harder to pump blood. Your arteries may become narrow or stiff. Having hypertension puts you at risk for heart disease, stroke, and other problems.  RISK  FACTORS Some risk factors for high blood pressure are controllable. Others are not.  Risk factors you cannot control include:   Race. You may be at higher risk if you are African American.  Age. Risk increases with age.  Gender. Men are at higher risk than women before age 45 years. After age 65, women are at higher risk than men. Risk factors you can control include:  Not getting enough exercise or physical activity.  Being overweight.  Getting too much fat, sugar, calories, or salt in your diet.  Drinking too much alcohol. SIGNS AND SYMPTOMS Hypertension does not usually cause signs or symptoms. Extremely high blood pressure (hypertensive crisis) may cause headache, anxiety, shortness of breath, and nosebleed. DIAGNOSIS  To check if you have hypertension, your health care provider will measure your blood pressure while you are seated, with your arm held at the level of your heart. It should be measured at least twice using the same arm. Certain conditions can cause a difference in blood pressure between your right and left arms. A blood pressure reading that is higher than normal on one occasion does not mean that you need treatment. If one blood pressure reading is high, ask your health care provider about having it checked again. TREATMENT  Treating high blood pressure includes making lifestyle changes and possibly taking medicine. Living a healthy lifestyle can help lower high blood pressure. You may need to change some of your habits. Lifestyle changes may include:  Following the DASH diet. This diet is high in fruits, vegetables, and whole grains. It is low in salt, red meat, and added sugars.  Getting at least 2 hours of brisk physical activity every week.  Losing weight if necessary.  Not smoking.  Limiting   alcoholic beverages.  Learning ways to reduce stress. If lifestyle changes are not enough to get your blood pressure under control, your health care provider may  prescribe medicine. You may need to take more than one. Work closely with your health care provider to understand the risks and benefits. HOME CARE INSTRUCTIONS  Have your blood pressure rechecked as directed by your health care provider.   Take medicines only as directed by your health care provider. Follow the directions carefully. Blood pressure medicines must be taken as prescribed. The medicine does not work as well when you skip doses. Skipping doses also puts you at risk for problems.   Do not smoke.   Monitor your blood pressure at home as directed by your health care provider. SEEK MEDICAL CARE IF:   You think you are having a reaction to medicines taken.  You have recurrent headaches or feel dizzy.  You have swelling in your ankles.  You have trouble with your vision. SEEK IMMEDIATE MEDICAL CARE IF:  You develop a severe headache or confusion.  You have unusual weakness, numbness, or feel faint.  You have severe chest or abdominal pain.  You vomit repeatedly.  You have trouble breathing. MAKE SURE YOU:   Understand these instructions.  Will watch your condition.  Will get help right away if you are not doing well or get worse. Document Released: 01/26/2005 Document Revised: 06/12/2013 Document Reviewed: 11/18/2012 ExitCare Patient Information 2015 [employer]. This information is not intended to replace advice given to you by your health care provider. Make sure you discuss any questions you have with your health care provider.  

## 2014-03-13 LAB — CMP14+EGFR
ALT: 10 IU/L (ref 0–32)
AST: 19 IU/L (ref 0–40)
Albumin/Globulin Ratio: 1.7 (ref 1.1–2.5)
Albumin: 3.9 g/dL (ref 3.5–4.7)
Alkaline Phosphatase: 70 IU/L (ref 39–117)
BUN/Creatinine Ratio: 19 (ref 11–26)
BUN: 22 mg/dL (ref 8–27)
CALCIUM: 9 mg/dL (ref 8.7–10.3)
CHLORIDE: 105 mmol/L (ref 97–108)
CO2: 26 mmol/L (ref 18–29)
CREATININE: 1.16 mg/dL — AB (ref 0.57–1.00)
GFR calc Af Amer: 48 mL/min/{1.73_m2} — ABNORMAL LOW (ref >59)
GFR calc non Af Amer: 42 mL/min/{1.73_m2} — ABNORMAL LOW (ref >59)
GLUCOSE: 201 mg/dL — AB (ref 65–99)
Globulin, Total: 2.3 g/dL (ref 1.5–4.5)
POTASSIUM: 4 mmol/L (ref 3.5–5.2)
SODIUM: 147 mmol/L — AB (ref 134–144)
Total Bilirubin: 0.5 mg/dL (ref 0.0–1.2)
Total Protein: 6.2 g/dL (ref 6.0–8.5)

## 2014-03-13 LAB — VITAMIN D 25 HYDROXY (VIT D DEFICIENCY, FRACTURES): Vit D, 25-Hydroxy: 39.2 ng/mL (ref 30.0–100.0)

## 2014-03-13 LAB — LIPID PANEL
CHOL/HDL RATIO: 3.9 ratio (ref 0.0–4.4)
Cholesterol, Total: 116 mg/dL (ref 100–199)
HDL: 30 mg/dL — ABNORMAL LOW (ref >39)
LDL Calculated: 38 mg/dL (ref 0–99)
Triglycerides: 242 mg/dL — ABNORMAL HIGH (ref 0–149)
VLDL Cholesterol Cal: 48 mg/dL — ABNORMAL HIGH (ref 5–40)

## 2014-03-14 ENCOUNTER — Telehealth: Payer: Self-pay | Admitting: Family

## 2014-03-14 NOTE — Telephone Encounter (Signed)
-----   Message from Sharion Balloon, [place] sent at 03/14/2014 12:11 PM EST ----- hgbA1C WNL Kidney and liver function stable Cholesterol levels WNL- Triglycerides elevated pt needs too be on low fat diet Vit D levels WNL

## 2014-03-16 ENCOUNTER — Ambulatory Visit: Payer: Medicaid Other | Admitting: Family

## 2014-03-23 DIAGNOSIS — H01004 Unspecified blepharitis left upper eyelid: Secondary | ICD-10-CM | POA: Diagnosis not present

## 2014-03-23 DIAGNOSIS — H04123 Dry eye syndrome of bilateral lacrimal glands: Secondary | ICD-10-CM | POA: Diagnosis not present

## 2014-03-23 DIAGNOSIS — Z961 Presence of intraocular lens: Secondary | ICD-10-CM | POA: Diagnosis not present

## 2014-03-23 DIAGNOSIS — H01001 Unspecified blepharitis right upper eyelid: Secondary | ICD-10-CM | POA: Diagnosis not present

## 2014-03-26 ENCOUNTER — Ambulatory Visit: Payer: Medicare Other | Admitting: Family

## 2014-04-04 ENCOUNTER — Encounter: Payer: Self-pay | Admitting: Family

## 2014-04-04 ENCOUNTER — Ambulatory Visit (INDEPENDENT_AMBULATORY_CARE_PROVIDER_SITE_OTHER): Payer: Medicare Other | Admitting: Family

## 2014-04-04 VITALS — BP 137/64 | HR 63 | Temp 97.1°F | Ht 63.0 in | Wt 114.0 lb

## 2014-04-04 DIAGNOSIS — I10 Essential (primary) hypertension: Secondary | ICD-10-CM | POA: Diagnosis not present

## 2014-04-04 NOTE — Patient Instructions (Signed)

## 2014-04-04 NOTE — Progress Notes (Signed)
   Subjective:    Patient ID: Stacey Brown, female    DOB: 09-Jan-1923, 81 y.o.   MRN: [id number]  HPI Pt presents for follow up on her hypertension. PT's BP is at goal today! Pt was seen in the office 2 weeks ago and had not been taking her BP medications. Medications were refilled and pt states she is feeling better since her last visit. Pt denies any headache, palpitations, SOB, or edema at this time.     Review of Systems  Constitutional: Negative.   HENT: Negative.   Eyes: Negative.   Respiratory: Negative.  Negative for shortness of breath.   Cardiovascular: Negative.  Negative for palpitations.  Gastrointestinal: Negative.   Endocrine: Negative.   Genitourinary: Negative.   Musculoskeletal: Negative.   Neurological: Negative.  Negative for headaches.  Hematological: Negative.   Psychiatric/Behavioral: Negative.   All other systems reviewed and are negative.      Objective:   Physical Exam  Constitutional: She is oriented to person, place, and time. She appears well-developed and well-nourished. No distress.  HENT:  Head: Normocephalic and atraumatic.  Right Ear: External ear normal.  Mouth/Throat: Oropharynx is clear and moist.  Eyes: Pupils are equal, round, and reactive to light.  Neck: Normal range of motion. Neck supple. No thyromegaly present.  Cardiovascular: Normal rate, regular rhythm, normal heart sounds and intact distal pulses.   No murmur heard. Pulmonary/Chest: Effort normal and breath sounds normal. No respiratory distress. She has no wheezes.  Abdominal: Soft. Bowel sounds are normal. She exhibits no distension. There is no tenderness.  Musculoskeletal: Normal range of motion. She exhibits edema (Mild edema in BLE). She exhibits no tenderness.  Neurological: She is alert and oriented to person, place, and time. She has normal reflexes. No cranial nerve deficit.  Skin: Skin is warm and dry.  Psychiatric: She has a normal mood and affect. Her behavior is  normal. Judgment and thought content normal.  Vitals reviewed.   BP 137/64 mmHg  Pulse 63  Temp(Src) 97.1 F (36.2 C) (Oral)  Ht $R'5\' 3"'kA$  (1.6 m)  Wt 114 lb (51.71 kg)  BMI 20.20 kg/m2       Assessment & Plan:  1. Essential hypertension, benign -Daily blood pressure log given with instructions on how to fill out and told to bring to next visit -Dash diet information given -Exercise encouraged - Stress Management  -Continue current meds -RTO in 3 months - BMP8+EGFR  Evelina Dun, FNP

## 2014-04-05 LAB — BMP8+EGFR
BUN / CREAT RATIO: 16 (ref 11–26)
BUN: 24 mg/dL (ref 8–27)
CO2: 29 mmol/L (ref 18–29)
CREATININE: 1.48 mg/dL — AB (ref 0.57–1.00)
Calcium: 10.4 mg/dL — ABNORMAL HIGH (ref 8.7–10.3)
Chloride: 98 mmol/L (ref 97–108)
GFR calc non Af Amer: 31 mL/min/{1.73_m2} — ABNORMAL LOW (ref >59)
GFR, EST AFRICAN AMERICAN: 36 mL/min/{1.73_m2} — AB (ref >59)
GLUCOSE: 190 mg/dL — AB (ref 65–99)
Potassium: 4.1 mmol/L (ref 3.5–5.2)
Sodium: 144 mmol/L (ref 134–144)

## 2014-04-06 ENCOUNTER — Other Ambulatory Visit: Payer: Self-pay | Admitting: Family

## 2014-05-07 DIAGNOSIS — I739 Peripheral vascular disease, unspecified: Secondary | ICD-10-CM | POA: Diagnosis not present

## 2014-05-10 ENCOUNTER — Other Ambulatory Visit: Payer: Self-pay | Admitting: Family Medicine

## 2014-05-22 DIAGNOSIS — H3532 Exudative age-related macular degeneration: Secondary | ICD-10-CM | POA: Diagnosis not present

## 2014-05-22 DIAGNOSIS — H04123 Dry eye syndrome of bilateral lacrimal glands: Secondary | ICD-10-CM | POA: Diagnosis not present

## 2014-05-22 DIAGNOSIS — H3530 Unspecified macular degeneration: Secondary | ICD-10-CM | POA: Diagnosis not present

## 2014-05-22 DIAGNOSIS — E11329 Type 2 diabetes mellitus with mild nonproliferative diabetic retinopathy without macular edema: Secondary | ICD-10-CM | POA: Diagnosis not present

## 2014-05-22 LAB — HM DIABETES EYE EXAM

## 2014-05-28 ENCOUNTER — Encounter: Payer: Self-pay | Admitting: *Deleted

## 2014-06-01 ENCOUNTER — Encounter: Payer: Self-pay | Admitting: Pharmacist

## 2014-06-01 ENCOUNTER — Ambulatory Visit (INDEPENDENT_AMBULATORY_CARE_PROVIDER_SITE_OTHER): Payer: Medicare Other | Admitting: Pharmacist

## 2014-06-01 ENCOUNTER — Other Ambulatory Visit: Payer: Self-pay | Admitting: Pharmacist

## 2014-06-01 VITALS — BP 116/60 | HR 62 | Ht 63.0 in | Wt 112.5 lb

## 2014-06-01 DIAGNOSIS — H9193 Unspecified hearing loss, bilateral: Secondary | ICD-10-CM

## 2014-06-01 DIAGNOSIS — M810 Age-related osteoporosis without current pathological fracture: Secondary | ICD-10-CM

## 2014-06-01 DIAGNOSIS — Z9181 History of falling: Secondary | ICD-10-CM

## 2014-06-01 DIAGNOSIS — R636 Underweight: Secondary | ICD-10-CM

## 2014-06-01 DIAGNOSIS — Z0000 Encounter for general adult medical examination without abnormal findings: Secondary | ICD-10-CM | POA: Diagnosis not present

## 2014-06-01 DIAGNOSIS — H9190 Unspecified hearing loss, unspecified ear: Secondary | ICD-10-CM | POA: Insufficient documentation

## 2014-06-01 DIAGNOSIS — L8992 Pressure ulcer of unspecified site, stage 2: Secondary | ICD-10-CM

## 2014-06-01 MED ORDER — BOOST DIABETIC PO LIQD: 1.0000 | Freq: Every day | ORAL | Status: DC

## 2014-06-01 MED ORDER — RAISED TOILET SEAT MISC: Status: DC

## 2014-06-01 MED ORDER — MENTHOL-ZINC OXIDE 0.44-20.6 % EX OINT: TOPICAL_OINTMENT | CUTANEOUS | Status: DC

## 2014-06-01 NOTE — Progress Notes (Signed)
Patient ID: Stacey Brown, female   DOB: 1922/04/23, 81 y.o.   MRN: [id number]    Subjective:   Stacey Brown is a 81 y.o. female who presents for an Initial Medicare Annual Wellness Visit.  Also assessed the risks vs benefits of osteoporosis medications. Patient previously took Prolia 60mg  SQ q6 months until about 3 months ago when she felt that Prolia was cause of a rash and dry skin on both legs. Per patient rash and dryness have improved.    Current Medications (verified) Outpatient Encounter Prescriptions as of 06/01/2014  Medication Sig  . amLODipine (NORVASC) 5 MG tablet Take 1 tablet (5 mg total) by mouth daily.  . carboxymethylcellulose (REFRESH PLUS) 0.5 % SOLN 1 drop 3 (three) times daily as needed.  . cromolyn (OPTICROM) 4 % ophthalmic solution   . ELIQUIS 2.5 MG TABS tablet TAKE ONE TABLET BY MOUTH TWICE DAILY  . furosemide (LASIX) 20 MG tablet TAKE ONE TABLET BY MOUTH ONE TIME DAILY  . glimepiride (AMARYL) 2 MG tablet TAKE ONE TABLET BY MOUTH ONE TIME DAILY BEFORE BREAKFAST  . levocetirizine (XYZAL) 5 MG tablet TAKE ONE TABLET BY MOUTH IN THE EVENING  . Menthol-Zinc Oxide (CALMOSEPTINE) 0.44-20.6 % OINT Use BID on bottom and as needed  . nebivolol (BYSTOLIC) 5 MG tablet Take 1 tablet (5 mg total) by mouth daily.  . rosuvastatin (CRESTOR) 40 MG tablet Take 1 tablet (40 mg total) by mouth daily.  . simethicone (MYLICON) 664 MG chewable tablet Chew 125 mg by mouth every 6 (six) hours as needed for flatulence.  . travoprost, benzalkonium, (TRAVATAN) 0.004 % ophthalmic solution Place 1 drop into both eyes at bedtime.  Marland Kitchen trimethoprim-polymyxin b (POLYTRIM) ophthalmic solution Place 1 drop into both eyes every 6 (six) hours.  . ursodiol (ACTIGALL) 300 MG capsule TAKE ONE CAPSULE BY MOUTH TWICE DAILY  . Vitamin D, Ergocalciferol, (DRISDOL) 50000 UNITS CAPS capsule Take 1 capsule (50,000 Units total) by mouth every 7 (seven) days.  . [DISCONTINUED] Rivaroxaban (XARELTO) 15 MG TABS  tablet Take 15 mg by mouth 2 (two) times daily with a meal.    Allergies (verified) Dexilant and Ace inhibitors   History: Past Medical History  Diagnosis Date  . Abnormal liver function test   . Atrial fibrillation   . Chronic anticoagulation   . History of DVT of lower extremity   . Primary biliary cirrhosis   . H/O TB (tuberculosis)   . Esophageal stricture   . Macular degeneration   . ASCVD (arteriosclerotic cardiovascular disease)   . Allergy     allergic  rhinitis  . Arthritis   . Diabetes mellitus without complication   . Glaucoma   . Hyperlipidemia   . Chronic kidney disease   . Osteoporosis   . Cataract   . GERD (gastroesophageal reflux disease)    Past Surgical History  Procedure Laterality Date  . Abdominal hysterectomy    . Minor hemorrhoidectomy    . Left breast biopsy    . Cholecystectomy    . Herniography    . Right lung biopsy    . Right hip replacement    . Liver biopsy    . Hernia repair     Family History  Problem Relation Age of Onset  . Breast cancer Other     Neice  . Cirrhosis Other     Nephew  . Heart disease Mother   . Diabetes Mother     Brother,  . Heart disease Brother   .  Kidney disease Cousin   . Colon cancer Neg Hx   . Cancer Father   . Hyperlipidemia Daughter   . Diabetes Daughter    Social History   Occupational History  . Retired     Disabled   Social History Main Topics  . Smoking status: Never Smoker   . Smokeless tobacco: Never Used  . Alcohol Use: No  . Drug Use: No  . Sexual Activity: Not on file    Dietary issues and exercise activities: Current Exercise Habits:: Home exercise routine, Type of exercise: walking;strength training/weights;stretching, Time (Minutes): 15, Frequency (Times/Week): > 6, Weekly Exercise (Minutes/Week): 0, Intensity: Moderate   Objective:    Today's Vitals   06/01/14 1119  BP: 116/60  Pulse: 62  Height: 5\' 3"  (1.6 m)  Weight: 112 lb 8 oz (51.03 kg)   Body mass index is  19.93 kg/(m^2).  Activities of Daily Living In your present state of health, do you have any difficulty performing the following activities: 06/01/2014  Hearing? Y  Vision? Y  Difficulty concentrating or making decisions? Y  Walking or climbing stairs? Y  Dressing or bathing? N  Doing errands, shopping? N  Preparing Food and eating ? N  Using the Toilet? N  In the past six months, have you accidently leaked urine? N  Do you have problems with loss of bowel control? N  Managing your Medications? N  Managing your Finances? N  Housekeeping or managing your Housekeeping? N   Patient mentions having diarrhea this am and on occasion.  When questioned further discovered that patient uses a suppository laxative when she feels bloated and constipated.  She use a suppository last night.  She reports using about 2 times per week.  She was taking a stool softner but this stopped working.  Are there smokers in your home (other than you)? No   Cardiac Risk Factors include: advanced age (>44men, >4 women);diabetes mellitus  Depression Screen PHQ 2/9 Scores 06/01/2014 03/12/2014 03/14/2013  PHQ - 2 Score 1 0 0    Fall Risk Fall Risk  06/01/2014 03/12/2014 03/14/2013  Falls in the past year? Yes No No  Number falls in past yr: 2 or more - -  Injury with Fall? No - -    Cognitive Function: MMSE - Mini Mental State Exam 06/01/2014  Orientation to time 0  Orientation to Place 2  Registration 3  Attention/ Calculation 5  Recall 1  Language- name 2 objects 2  Language- repeat 1  Language- follow 3 step command 3  Language- read & follow direction 1  Write a sentence 0  Copy design 0  Total score 18    Immunizations and Health Maintenance Immunization History  Administered Date(s) Administered  . Influenza,inj,Quad PF,36+ Mos 11/03/2012  . Pneumococcal Conjugate-13 11/01/2013  . Tdap 05/19/2012   There are no preventive care reminders to display for this patient.  Patient Care  Team: Sharion Balloon, FNP as PCP - General (Nurse Practitioner)  Indicate any recent Medical Services you may have received from other than [facility] providers in the past year (date may be approximate).    Assessment:    Annual Wellness Visit  Osteoporosis Constipation / Loose Stools High fall risk Underweight   Screening Tests Health Maintenance  Topic Date Due  . COLONOSCOPY  08/06/2014 (Originally 12/31/1974)  . ZOSTAVAX  09/10/2014 (Originally 12/30/1984)  . FOOT EXAM  09/05/2014  . URINE MICROALBUMIN  09/05/2014  . INFLUENZA VACCINE  09/10/2014  . HEMOGLOBIN A1C  09/10/2014  . PNA vac Low Risk Adult (2 of 2 - PPSV23) 11/02/2014  . OPHTHALMOLOGY EXAM  05/22/2015  . TETANUS/TDAP  05/20/2022  . DEXA SCAN  Completed        Plan:   During the course of the visit Davan was educated and counseled about the following appropriate screening and preventive services:   Vaccines to include Pneumoccal, Influenza, Hepatitis B, Td, Zostavax-patient is UTD on all vaccinations.  Colorectal cancer screening-patient does not recall having had a colonoscopy but is not interested in one at this time.   Cardiovascular disease screening-patient's BP is within goal at 116/60. Patient has a contraindication to aspirin 81 mg due to being on Eliquis. She is on rosuvastatin for her lipids and her lipids are within goal. She tries to do some light exercises throughout the day such as stretching or lifts her walker for some weight bearing exercise.  Diabetes-patient's most recent A1c was 7.1% (03/2014) which is within goal for her age.   Bone Denisty / Osteoporosis Screening-patient had DXA in 12/2013 and is UTD. Discussed the risk vs benefit of starting her on osteoporosis medications and patient understood that the risks may not outweigh the benefits.   Mammogram-will call to ask patient if she is interested in getting a mammogram.  Glaucoma screening / Diabetic Eye Exam-patient had Diabetic eye  exam recently on 05/22/2014 and is UTD. She sees Dr. Hassell Done for her eyes.   Advanced Directives-patient has an advanced directive and is not interested in changes at this time.   Recommended miralax or senna-s to help with constipation rather than suppositories.   Referred patient to Dr. Fanny Skates for possible hearing aids  Rx for high toilet seat given to help prevent fall in bathroom.  Encouraged to use walker more.  Fall prevention discussed.  Recommended Boost for diabetics to help increase weight / nutrition.  Goal weight is 115#  Patient Instructions (the written plan) were given to the patient.   Cherre Robins, [facility]   06/01/2014

## 2014-06-01 NOTE — Patient Instructions (Addendum)
  Stacey Brown , Thank you for taking time to come for your Medicare Wellness Visit. I appreciate your ongoing commitment to your health goals. Please review the following plan we discussed and let me know if I can assist you in the future.   These are the goals we discussed: Goals    . Gain weight     Goal weight is 115 lbs.  I have given you a prescription for Boost for diabetics that might help with nutrition and to increase weight       We will send a referral to audiologist (hearing specialist) Use walker every day!! (to decrease falls)  Try Miralax - mix 1 capful with liquid of choice and drink once a day or try Sennakot S take 1 to 2 tablets as needed for constipation.   (then would not need to use suppositories as often)  This is a list of the screening recommended for you and due dates:  Health Maintenance  Topic Date Due  . Colon Cancer Screening  08/06/2014*  . Shingles Vaccine  09/10/2014*  . Complete foot exam   09/05/2014  . Urine Protein Check  09/05/2014  . Flu Shot  09/10/2014  . Hemoglobin A1C  07/11/2014  . Pneumonia vaccines (2 of 2 - PPSV23) 11/02/2014  . Eye exam for diabetics  05/22/2015  . Tetanus Vaccine  05/20/2022  . DEXA scan (bone density measurement)  12/2015  *Topic was postponed. The date shown is not the original due date.

## 2014-06-04 ENCOUNTER — Other Ambulatory Visit: Payer: Medicare Other

## 2014-06-04 ENCOUNTER — Telehealth: Payer: Self-pay | Admitting: Family

## 2014-06-04 DIAGNOSIS — Z1212 Encounter for screening for malignant neoplasm of rectum: Secondary | ICD-10-CM

## 2014-06-04 NOTE — Progress Notes (Signed)
Lab only 

## 2014-06-04 NOTE — Progress Notes (Signed)
Patient ID: Stacey Brown, female   DOB: September 11, 1922, 81 y.o.   MRN: [id number]  Patient asked about getting mammogram - declined.

## 2014-06-04 NOTE — Telephone Encounter (Signed)
I had called to discussed patient's getting mammogram.  Patient declines to have check in future.

## 2014-06-06 LAB — FECAL OCCULT BLOOD, IMMUNOCHEMICAL: Fecal Occult Bld: NEGATIVE

## 2014-07-02 ENCOUNTER — Encounter: Payer: Self-pay | Admitting: Family

## 2014-07-02 ENCOUNTER — Ambulatory Visit (INDEPENDENT_AMBULATORY_CARE_PROVIDER_SITE_OTHER): Payer: Medicare Other | Admitting: Family

## 2014-07-02 VITALS — BP 152/68 | HR 57 | Temp 96.7°F | Ht 63.0 in | Wt 113.8 lb

## 2014-07-02 DIAGNOSIS — I4891 Unspecified atrial fibrillation: Secondary | ICD-10-CM | POA: Diagnosis not present

## 2014-07-02 DIAGNOSIS — N189 Chronic kidney disease, unspecified: Secondary | ICD-10-CM

## 2014-07-02 DIAGNOSIS — E785 Hyperlipidemia, unspecified: Secondary | ICD-10-CM

## 2014-07-02 DIAGNOSIS — M810 Age-related osteoporosis without current pathological fracture: Secondary | ICD-10-CM

## 2014-07-02 DIAGNOSIS — M1990 Unspecified osteoarthritis, unspecified site: Secondary | ICD-10-CM | POA: Diagnosis not present

## 2014-07-02 DIAGNOSIS — E559 Vitamin D deficiency, unspecified: Secondary | ICD-10-CM

## 2014-07-02 DIAGNOSIS — R296 Repeated falls: Secondary | ICD-10-CM

## 2014-07-02 DIAGNOSIS — J309 Allergic rhinitis, unspecified: Secondary | ICD-10-CM | POA: Diagnosis not present

## 2014-07-02 DIAGNOSIS — F0390 Unspecified dementia without behavioral disturbance: Secondary | ICD-10-CM

## 2014-07-02 DIAGNOSIS — E119 Type 2 diabetes mellitus without complications: Secondary | ICD-10-CM

## 2014-07-02 DIAGNOSIS — I10 Essential (primary) hypertension: Secondary | ICD-10-CM | POA: Diagnosis not present

## 2014-07-02 DIAGNOSIS — Z9181 History of falling: Secondary | ICD-10-CM

## 2014-07-02 LAB — POCT GLYCOSYLATED HEMOGLOBIN (HGB A1C): Hemoglobin A1C: 7.1

## 2014-07-02 MED ORDER — FLUTICASONE PROPIONATE 50 MCG/ACT NA SUSP: 2.0000 | Freq: Every day | NASAL | Status: DC

## 2014-07-02 MED ORDER — DONEPEZIL HCL 5 MG PO TBDP: 5.0000 mg | ORAL_TABLET | Freq: Every day | ORAL | Status: DC

## 2014-07-02 NOTE — Patient Instructions (Signed)

## 2014-07-02 NOTE — Progress Notes (Signed)
Subjective:    Patient ID: Stacey Brown, female    DOB: 1923-02-08, 81 y.o.   MRN: [id number]  Pt is brought in by grandson. Pt lives with her daughter who wants the pt "tested for dementia". Pt's grandson states "she does forgot things some times". Hypertension This is a chronic problem. The current episode started more than 1 year ago. The problem has been waxing and waning since onset. The problem is uncontrolled. Associated symptoms include palpitations ("at times"), peripheral edema ("at times") and shortness of breath. Pertinent negatives include no blurred vision, chest pain or headaches. Risk factors for coronary artery disease include diabetes mellitus, dyslipidemia, post-menopausal state and family history. Past treatments include calcium channel blockers, beta blockers and diuretics. The current treatment provides moderate improvement. Hypertensive end-organ damage includes kidney disease. There is no history of CAD/MI, CVA, heart failure or a thyroid problem. There is no history of sleep apnea.  Diabetes She presents for her follow-up diabetic visit. She has type 2 diabetes mellitus. Her disease course has been improving. Hypoglycemia symptoms include dizziness. Pertinent negatives for hypoglycemia include no confusion, headaches or mood changes. Associated symptoms include foot paresthesias. Pertinent negatives for diabetes include no blurred vision, no chest pain, no foot ulcerations and no visual change. There are no hypoglycemic complications. Symptoms are improving. Diabetic complications include nephropathy and peripheral neuropathy. Pertinent negatives for diabetic complications include no CVA or heart disease. Risk factors for coronary artery disease include diabetes mellitus, dyslipidemia, family history, hypertension and post-menopausal. Current diabetic treatment includes oral agent (monotherapy). She is compliant with treatment all of the time. She is following a diabetic diet.  Her breakfast blood glucose range is generally 90-110 mg/dl. An ACE inhibitor/angiotensin II receptor blocker is not being taken. Eye exam is current.  Hyperlipidemia This is a chronic problem. The current episode started more than 1 year ago. The problem is controlled. Recent lipid tests were reviewed and are normal. Exacerbating diseases include diabetes. She has no history of hypothyroidism. Associated symptoms include shortness of breath. Pertinent negatives include no chest pain or myalgias. Current antihyperlipidemic treatment includes statins. The current treatment provides significant improvement of lipids. Risk factors for coronary artery disease include diabetes mellitus, dyslipidemia, family history, hypertension, a sedentary lifestyle and post-menopausal.      Review of Systems  Constitutional: Negative.   HENT: Negative.   Eyes: Negative.  Negative for blurred vision.  Respiratory: Positive for shortness of breath.   Cardiovascular: Positive for palpitations ("at times"). Negative for chest pain.  Gastrointestinal: Negative.   Endocrine: Negative.   Genitourinary: Negative.   Musculoskeletal: Negative.  Negative for myalgias.  Neurological: Positive for dizziness. Negative for headaches.  Hematological: Negative.   Psychiatric/Behavioral: Negative.  Negative for confusion.  All other systems reviewed and are negative.      Objective:   Physical Exam  Constitutional: She is oriented to person, place, and time. She appears well-developed and well-nourished. No distress.  HENT:  Head: Normocephalic and atraumatic.  Right Ear: External ear normal.  Left Ear: External ear normal.  Nose: Nose normal.  Mouth/Throat: Oropharynx is clear and moist.  Eyes: Pupils are equal, round, and reactive to light.  Neck: Normal range of motion. Neck supple. No thyromegaly present.  Cardiovascular: Normal rate, regular rhythm, normal heart sounds and intact distal pulses.   No murmur  heard. Pulmonary/Chest: Effort normal. No respiratory distress. She has no wheezes.  Diminished breath sounds bilaterally   Abdominal: Soft. Bowel sounds are normal. She exhibits no  distension. There is no tenderness.  Musculoskeletal: Normal range of motion. She exhibits edema (2+ swelling in BLE) and tenderness (BLE).  Neurological: She is alert and oriented to person, place, and time. She has normal reflexes. No cranial nerve deficit.  Skin: Skin is warm and dry.  Psychiatric: She has a normal mood and affect. Her behavior is normal. Judgment and thought content normal.  Vitals reviewed.   BP 152/68 mmHg  Pulse 57  Temp(Src) 96.7 F (35.9 C) (Oral)  Ht '5\' 3"'  (1.6 m)  Wt 113 lb 12.8 oz (51.619 kg)  BMI 20.16 kg/m2  Mini-Cog assessment given to pt. She scored: 1 point  Assessment tool scanned into EPIC    Assessment & Plan:  1. Essential hypertension, benign - CMP14+EGFR  2. Diabetes mellitus without complication - GXQ11+HERD - POCT glycosylated hemoglobin (Hb A1C)  3. Osteoporosis - CMP14+EGFR  4. Vitamin D deficiency - CMP14+EGFR - Vit D  25 hydroxy (rtn osteoporosis monitoring)  5. Hyperlipidemia - CMP14+EGFR  6. At high risk for falls - CMP14+EGFR - Lipid panel  7. Arthritis - CMP14+EGFR  8. Chronic kidney disease, unspecified stage - CMP14+EGFR  9. Atrial fibrillation, unspecified - CMP14+EGFR  10. Dementia, without behavioral disturbance -Discussed falls risks and taking medications safely -RTO in 6 weeks to determine if aricept is helping and to increase? - CMP14+EGFR - donepezil (ARICEPT ODT) 5 MG disintegrating tablet; Take 1 tablet (5 mg total) by mouth at bedtime.  Dispense: 90 tablet; Refill: 2  11. Allergic rhinitis, unspecified allergic rhinitis type - CMP14+EGFR - fluticasone (FLONASE) 50 MCG/ACT nasal spray; Place 2 sprays into both nostrils daily.  Dispense: 16 g; Refill: 6   Continue all meds Labs pending Health Maintenance  reviewed Diet and exercise encouraged RTO 6 weeks to recheck dementia   Evelina Dun, FNP

## 2014-07-03 LAB — LIPID PANEL
Chol/HDL Ratio: 3.3 ratio units (ref 0.0–4.4)
Cholesterol, Total: 112 mg/dL (ref 100–199)
HDL: 34 mg/dL — ABNORMAL LOW (ref >39)
LDL Calculated: 28 mg/dL (ref 0–99)
Triglycerides: 249 mg/dL — ABNORMAL HIGH (ref 0–149)
VLDL CHOLESTEROL CAL: 50 mg/dL — AB (ref 5–40)

## 2014-07-03 LAB — CMP14+EGFR
A/G RATIO: 1.8 (ref 1.1–2.5)
ALT: 10 IU/L (ref 0–32)
AST: 21 IU/L (ref 0–40)
Albumin: 4.3 g/dL (ref 3.5–4.7)
Alkaline Phosphatase: 106 IU/L (ref 39–117)
BILIRUBIN TOTAL: 0.5 mg/dL (ref 0.0–1.2)
BUN/Creatinine Ratio: 21 (ref 11–26)
BUN: 27 mg/dL (ref 8–27)
CALCIUM: 9.8 mg/dL (ref 8.7–10.3)
CO2: 24 mmol/L (ref 18–29)
CREATININE: 1.31 mg/dL — AB (ref 0.57–1.00)
Chloride: 101 mmol/L (ref 97–108)
GFR calc Af Amer: 42 mL/min/{1.73_m2} — ABNORMAL LOW (ref >59)
GFR calc non Af Amer: 36 mL/min/{1.73_m2} — ABNORMAL LOW (ref >59)
GLOBULIN, TOTAL: 2.4 g/dL (ref 1.5–4.5)
Glucose: 176 mg/dL — ABNORMAL HIGH (ref 65–99)
Potassium: 4.3 mmol/L (ref 3.5–5.2)
SODIUM: 145 mmol/L — AB (ref 134–144)
TOTAL PROTEIN: 6.7 g/dL (ref 6.0–8.5)

## 2014-07-03 LAB — VITAMIN D 25 HYDROXY (VIT D DEFICIENCY, FRACTURES): VIT D 25 HYDROXY: 68.5 ng/mL (ref 30.0–100.0)

## 2014-08-03 ENCOUNTER — Other Ambulatory Visit: Payer: Self-pay | Admitting: Family

## 2014-08-06 DIAGNOSIS — I739 Peripheral vascular disease, unspecified: Secondary | ICD-10-CM | POA: Diagnosis not present

## 2014-08-07 ENCOUNTER — Telehealth: Payer: Self-pay

## 2014-08-07 NOTE — Telephone Encounter (Signed)
Insurance approved Eliquis 2.5 mg through 08/07/15

## 2014-09-04 ENCOUNTER — Other Ambulatory Visit: Payer: Self-pay | Admitting: Family

## 2014-09-08 ENCOUNTER — Ambulatory Visit (INDEPENDENT_AMBULATORY_CARE_PROVIDER_SITE_OTHER): Payer: Medicare Other | Admitting: Family Medicine

## 2014-09-08 VITALS — BP 150/69 | HR 76 | Temp 96.8°F | Ht 63.0 in | Wt 105.4 lb

## 2014-09-08 DIAGNOSIS — E119 Type 2 diabetes mellitus without complications: Secondary | ICD-10-CM

## 2014-09-08 DIAGNOSIS — N183 Chronic kidney disease, stage 3 unspecified: Secondary | ICD-10-CM

## 2014-09-08 DIAGNOSIS — K5901 Slow transit constipation: Secondary | ICD-10-CM

## 2014-09-08 LAB — GLUCOSE, POCT (MANUAL RESULT ENTRY): POC Glucose: 145 mg/dl — AB (ref 70–99)

## 2014-09-08 MED ORDER — OMEPRAZOLE 40 MG PO CPDR: 40.0000 mg | DELAYED_RELEASE_CAPSULE | Freq: Every day | ORAL | Status: DC

## 2014-09-08 MED ORDER — LINACLOTIDE 145 MCG PO CAPS: 145.0000 ug | ORAL_CAPSULE | Freq: Every day | ORAL | Status: DC

## 2014-09-08 NOTE — Progress Notes (Signed)
Subjective:  Patient ID: Stacey Brown, female    DOB: 1922/12/31  Age: 81 y.o. MRN: [id number]  CC: Abdominal Pain and Edema   HPI ZYION LEIDNER presents for Infrequent bowel movements. Chronic constipation forwhich she uses Dulcolax suppositories. Usually has to use a suppository and then BM ma y be too small. Using them qod. Causes abd. Distention and discomfort that can reach a 6/10. Causes her to lose her appetite. Daughter states she doesn't eat well. Wants her to have relief so her appetite and nutrition will improve.  BLE edema i feels tight but otherwise painless. Causes some redness at shins. Not associated with dyspnea. No chest pain.  The daughter says they  Ran out of her glucose test strips. No symptoms of low glucose. Would like a fingerstick here today.  History Keyry has a past medical history of Abnormal liver function test; Atrial fibrillation; Chronic anticoagulation; History of DVT of lower extremity; Primary biliary cirrhosis; H/O TB (tuberculosis); Esophageal stricture; Macular degeneration; ASCVD (arteriosclerotic cardiovascular disease); Allergy; Arthritis; Diabetes mellitus without complication; Glaucoma; Hyperlipidemia; Chronic kidney disease; Osteoporosis; Cataract; and GERD (gastroesophageal reflux disease).   She has past surgical history that includes Abdominal hysterectomy; Minor hemorrhoidectomy; Left breast biopsy; Cholecystectomy; Herniography; Right lung biopsy; Right hip replacement; Liver biopsy; and Hernia repair.   Her family history includes Breast cancer in her other; Cancer in her father; Cirrhosis in her other; Diabetes in her daughter and mother; Heart disease in her brother and mother; Hyperlipidemia in her daughter; Kidney disease in her cousin. There is no history of Colon cancer.She reports that she has never smoked. She has never used smokeless tobacco. She reports that she does not drink alcohol or use illicit drugs.  Outpatient Prescriptions  Prior to Visit  Medication Sig Dispense Refill  . amLODipine (NORVASC) 5 MG tablet Take 1 tablet (5 mg total) by mouth daily. 90 tablet 3  . carboxymethylcellulose (REFRESH PLUS) 0.5 % SOLN 1 drop 3 (three) times daily as needed.    . CRESTOR 40 MG tablet TAKE 1 TABLET (40 MG TOTAL) BY MOUTH DAILY. 90 tablet 0  . ELIQUIS 2.5 MG TABS tablet TAKE ONE TABLET BY MOUTH TWICE DAILY 60 tablet 1  . fluticasone (FLONASE) 50 MCG/ACT nasal spray Place 2 sprays into both nostrils daily. 16 g 6  . furosemide (LASIX) 20 MG tablet TAKE ONE TABLET BY MOUTH ONE TIME DAILY 30 tablet 4  . glimepiride (AMARYL) 2 MG tablet TAKE ONE TABLET BY MOUTH ONE  TIME DAILY BEFORE BREAKFAST 30 tablet 1  . levocetirizine (XYZAL) 5 MG tablet TAKE ONE TABLET BY MOUTH IN THE EVENING 30 tablet 4  . Menthol-Zinc Oxide (CALMOSEPTINE) 0.44-20.6 % OINT Use BID on bottom and as needed 71 g 0  . Misc. Devices (RAISED TOILET SEAT) MISC Use as directed.  Dx:  High fall risk R 29.6 and osteoporosis M 81.0 1 each 0  . nebivolol (BYSTOLIC) 5 MG tablet Take 1 tablet (5 mg total) by mouth daily. 90 tablet 3  . Nutritional Supplements (BOOST DIABETIC) LIQD Take 1 Can by mouth daily. 237 mL 2  . simethicone (MYLICON) 099 MG chewable tablet Chew 125 mg by mouth every 6 (six) hours as needed for flatulence.    . travoprost, benzalkonium, (TRAVATAN) 0.004 % ophthalmic solution Place 1 drop into both eyes at bedtime.    Marland Kitchen trimethoprim-polymyxin b (POLYTRIM) ophthalmic solution Place 1 drop into both eyes every 6 (six) hours. 10 mL 1  . ursodiol (ACTIGALL)  300 MG capsule TAKE ONE CAPSULE BY MOUTH TWICE DAILY 60 capsule 11  . Vitamin D, Ergocalciferol, (DRISDOL) 50000 UNITS CAPS capsule Take 1 capsule (50,000 Units total) by mouth every 7 (seven) days. 30 capsule 6  . cromolyn (OPTICROM) 4 % ophthalmic solution     . donepezil (ARICEPT ODT) 5 MG disintegrating tablet Take 1 tablet (5 mg total) by mouth at bedtime. 90 tablet 2   No  facility-administered medications prior to visit.    ROS Review of Systems  Constitutional: Negative for fever, chills, diaphoresis, appetite change, fatigue and unexpected weight change.  HENT: Negative for congestion, ear pain, hearing loss, postnasal drip, rhinorrhea, sneezing, sore throat and trouble swallowing.   Eyes: Negative for pain.  Respiratory: Negative for cough, chest tightness and shortness of breath.   Cardiovascular: Positive for leg swelling. Negative for chest pain and palpitations.  Gastrointestinal: Positive for nausea, abdominal pain, constipation and abdominal distention. Negative for vomiting, diarrhea, blood in stool and rectal pain.  Genitourinary: Negative for dysuria, frequency and menstrual problem.  Musculoskeletal: Positive for joint swelling and arthralgias.  Skin: Negative for rash.  Neurological: Negative for dizziness, weakness, numbness and headaches.  Psychiatric/Behavioral: Negative for dysphoric mood and agitation.    Objective:  BP 150/69 mmHg  Pulse 76  Temp(Src) 96.8 F (36 C) (Oral)  Ht '5\' 3"'  (1.6 m)  Wt 105 lb 6.4 oz (47.809 kg)  BMI 18.68 kg/m2  BP Readings from Last 3 Encounters:  09/08/14 150/69  07/02/14 152/68  06/01/14 116/60    Wt Readings from Last 3 Encounters:  09/08/14 105 lb 6.4 oz (47.809 kg)  07/02/14 113 lb 12.8 oz (51.619 kg)  06/01/14 112 lb 8 oz (51.03 kg)     Physical Exam  Lab Results  Component Value Date   HGBA1C 7.1 07/02/2014   HGBA1C 7.1 03/12/2014   HGBA1C 6.5 03/09/2013    Lab Results  Component Value Date   WBC 7.4 01/27/2014   HGB 13.1 01/27/2014   HCT 41.4 01/27/2014   PLT 146* 08/25/2007   GLUCOSE 135* 09/08/2014   CHOL 112 07/02/2014   TRIG 249* 07/02/2014   HDL 34* 07/02/2014   LDLCALC 28 07/02/2014   ALT 8 09/08/2014   AST 20 09/08/2014   NA 144 09/08/2014   K 3.1* 09/08/2014   CL 97 09/08/2014   CREATININE 1.18* 09/08/2014   BUN 18 09/08/2014   CO2 23 09/08/2014   INR 2.2  07/20/2013   HGBA1C 7.1 07/02/2014    Dg Swallowing Func-speech Pathology  04/01/2012   Macario Golds, CCC-SLP     04/01/2012  5:16 PM Objective Swallowing Evaluation: Modified Barium Swallowing Study   Patient Details  Name: SHANTERA MONTS MRN: [id number] Date of Birth: 12-Nov-1922  Today's Date: 04/01/2012 Time: 6314-9702 SLP Time Calculation (min): 71 min  Past Medical History:  Past Medical History  Diagnosis Date  . Abnormal liver function test   . Arthritis   . Atrial fibrillation   . Chronic anticoagulation   . History of DVT of lower extremity    Past Surgical History:  Past Surgical History  Procedure Laterality Date  . Abdominal hysterectomy    . Minor hemorrhoidectomy    . Left breast biopsy    . Cholecystectomy    . Herniography    . Right lung biopsy    . Right hip replacement    . Liver biopsy     HPI:  81 yo female referred by [facility] for MBS  due to pt aspirating  on esophagram Mar 18, 2012.  PMH + for dysphagia, Afib and remote  DVT, HLD, HTN.  Pt denies significant weight loss but does admit  to recurrent colds. Medication list includes Norvasc, Lipitor,  Lasix, Amaryl, Bystolic, Prilosec, Coumadin, Actigall, Travatan.   Pt previously had undergone a bronch with removal of lettuce from  her lung when she was in her 70's per her daughter's statement.   She denies ever requiring heimlich maneuver.      Dysphagia most notably worsened since last summer when pt  "choked" on robitussin and it caused a "burn", per her statement.   She admits to decreased overall body strength since last summer  as well.  Pt describes sensation of food sticking in throat  requiring her to hock it up to swallow again.  Description  depicts more problems with food "sticking" and water causing her  to "strangle/choke".  Pt also admits to liquids coming up in  esophagus and frequent belching.  Large pills will often stick in  throat requiring pt to swallow food and carbonated beverages to  push it down per pt.  In  addition, pt pointed to distal esophagus  stating sometimes she feels like "it won't go down."    Pt eats  alone at times per her statement.      Assessment / Plan / Recommendation Clinical Impression  Dysphagia Diagnosis: Moderate pharyngeal phase  dysphagia;Moderate cervical esophageal phase dysphagia;Suspected  primary esophageal dysphagia  Clinical impression: Pt presents with moderate pharyngeal and  cervical esophageal dysphagia as well as suspected primary  esophageal issues.    Pt did not aspirate any consistency tested, but did demonstrate  laryngeal penetration of thin and nectar liquids due to decr  laryngeal elevation/closure and decreased UES clearance.    Chin  tuck posture prevented laryngeal penetration and reflexive throat  clearing removed mild amount of penetration.  Epiglottic  deflection is compromised and pt's epiglottis is small and in  anterior position decreasing vallecular space.  Pt conduct  multiple swallows with every bite/sip while holding her breath to  decrease pharyngeal stasis.  Even with multiple reflexive  swallows, pt continued with mild vallecular stasis noted across  consistencies without pt awareness.  Advised pt to follow solids  with liquids and conduct chin tuck with liquids and intermittent  dry swallows.  Further advised if pt sensed stasis to take rest  break.  Multiple small meals daily may help pt to compensate for  multifactorial deficits.    Suspect pt's pharyngeal symptoms are primarily secondary to  esophageal dysphagia.  Thoroughly educated pt and daughter Bertram Millard  to findings and recommendations and suspicion of chronic  dysphagia with decreased functional reserve due to aging.  Ruby  was able to teach back information but pt was not and daughter  states she has to repeat things to her mother frequently- ?  cognition.  Pt's cognition may negatively impact her ability to  follow compensation strategies.  If pt becomes ill or severely  deconditioned, her dysphagia may  become severely exacerbated with  worsening pulmonary ramifications.  Advised family to learn  hemilch manuever for emergent use.      Treatment Recommendation    xerostomia tips provided   Diet Recommendation Dysphagia 3 (Mechanical Soft);Thin liquid  (extra gravies, sauces, casserole style foods easier)   Liquid Administration via: Cup;Straw Medication Administration: Crushed with puree (whole if  contraindicated) Supervision: Full supervision/cueing for compensatory strategies Compensations: Slow rate;Small sips/bites;Follow solids with  liquid (start meal with drinks) Postural Changes and/or Swallow Maneuvers: Chin tuck    Other  Recommendations Oral Care Recommendations: Oral care  before and after PO   Follow Up Recommendations  None    Frequency and Duration     n/a   Pertinent Vitals/Pain Appeared comfortable    SLP Swallow Goals     General Date of Onset: 04/01/12 HPI: 81 yo female referred by [facility] for MBS due to pt  aspirating on esophagram Mar 18, 2012.  PMH + for dysphagia, Afib  and remote DVT, HLD, HTN.  Pt reports dysphagia most notably  since last summer when she had "choked" on robitussin.  She  reports problems worsening since then, but admits to decreased  overall strength.  Pt denies significant weight loss but does  admit to recurrent colds. Medication list includes Norvasc,  Lipitor, Lasix, Amaryl, Bystolic, Prilosec, Coumadin, Actigall,  Travatan.  Pt previously had undergone a bronch with removal of  lettuce from her lung when she was in her 70's per her daughter's  statement.  Type of Study: Modified Barium Swallowing Study Reason for Referral: Objectively evaluate swallowing function Previous Swallow Assessment: Pt had a previous MBS in 2001  showing penetration of liquids that was prevented with chin tuck  and vallecular pooling, Esophagram 03/18/12 showed penetration with  individual swallows, small Zenker's diverticulum - study was  ceased d/t penetration.   Diet Prior to this Study:  Dysphagia 3 (soft);Thin liquids History of Recent Intubation: No Behavior/Cognition: Alert;Cooperative Oral Cavity - Dentition: Adequate natural dentition Oral Motor / Sensory Function: Within functional limits Self-Feeding Abilities: Able to feed self Patient Positioning: Upright in chair Baseline Vocal Quality: Clear Volitional Cough: Strong Volitional Swallow: Able to elicit Anatomy: Within functional limits Pharyngeal Secretions:  (pt complains of pharyngeal secretion  retention)    Reason for Referral Objectively evaluate swallowing function   Oral Phase Oral Preparation/Oral Phase Oral Phase: WFL Oral Phase - Comment Oral Phase - Comment: pt demonstrated piecemeal deglutition,  suspect  this is compensatory for years of pharyngeal and  esophageal dysphagia   Pharyngeal Phase Pharyngeal Phase Pharyngeal Phase: Impaired Pharyngeal - Nectar Pharyngeal - Nectar Teaspoon: Penetration/Aspiration during  swallow;Pharyngeal residue - valleculae;Reduced airway/laryngeal  closure;Reduced laryngeal elevation;Reduced epiglottic  inversion;Reduced anterior laryngeal mobility;Reduced pharyngeal  peristalsis Penetration/Aspiration details (nectar teaspoon): Material enters  airway, remains ABOVE vocal cords and not ejected out Pharyngeal - Nectar Cup: Reduced tongue base retraction;Reduced  airway/laryngeal closure;Reduced laryngeal elevation;Reduced  anterior laryngeal mobility;Reduced epiglottic inversion;Reduced  pharyngeal peristalsis;Lateral channel  residue;Penetration/Aspiration during swallow Penetration/Aspiration details (nectar cup): Material enters  airway, remains ABOVE vocal cords and not ejected out Pharyngeal - Thin Pharyngeal - Thin Teaspoon: Pharyngeal residue -  valleculae;Reduced anterior laryngeal mobility;Reduced epiglottic  inversion;Reduced pharyngeal peristalsis;Reduced laryngeal  elevation;Reduced airway/laryngeal closure Pharyngeal - Thin Cup: Penetration/Aspiration during  swallow;Reduced  epiglottic inversion;Reduced anterior laryngeal  mobility;Reduced pharyngeal peristalsis;Reduced laryngeal  elevation;Reduced airway/laryngeal closure Penetration/Aspiration details (thin cup): Material enters  airway, remains ABOVE vocal cords and not ejected out Pharyngeal - Thin Straw: Reduced airway/laryngeal closure;Reduced  laryngeal elevation;Reduced anterior laryngeal mobility;Reduced  epiglottic inversion;Penetration/Aspiration during swallow Penetration/Aspiration details (thin straw): Material enters  airway, CONTACTS cords and not ejected out Pharyngeal - Solids Pharyngeal - Puree: Pharyngeal residue - valleculae;Reduced  epiglottic inversion;Reduced anterior laryngeal mobility;Reduced  pharyngeal peristalsis;Premature spillage to valleculae Pharyngeal - Regular: Reduced epiglottic inversion;Reduced  pharyngeal peristalsis;Reduced anterior laryngeal  mobility;Pharyngeal residue - valleculae;Premature spillage to  valleculae Pharyngeal - Pill: Pharyngeal residue - cp segment (taken  with  pudding) Pharyngeal Phase - Comment Pharyngeal Comment: barium tablet lodged at CP region:  reflexive  dry swallow pushed it further into esophagus, chin tuck posture  eliminated penetration of thin liquid but doubt pt will complete  routinely  Cervical Esophageal Phase    GO    Cervical Esophageal Phase Cervical Esophageal Phase: Impaired Cervical Esophageal Phase - Nectar Nectar Teaspoon: Prominent cricopharyngeal segment;Reduced  cricopharyngeal relaxation Nectar Cup: Reduced cricopharyngeal relaxation;Prominent  cricopharyngeal segment Cervical Esophageal Phase - Thin Thin Teaspoon: Reduced cricopharyngeal relaxation;Prominent  cricopharyngeal segment Thin Cup: Reduced cricopharyngeal relaxation;Prominent  cricopharyngeal segment Thin Straw: Prominent cricopharyngeal segment;Reduced  cricopharyngeal relaxation Cervical Esophageal Phase - Solids Puree: Reduced cricopharyngeal relaxation;Prominent  cricopharyngeal  segment Regular: Prominent cricopharyngeal segment;Reduced  cricopharyngeal relaxation Pill: Reduced cricopharyngeal relaxation;Prominent  cricopharyngeal segment Cervical Esophageal Phase - Comment Cervical Esophageal Comment: appearance of slow clearance  distally after a few boluses of nectar thick liquids,  stasis of  pudding throughout entire esophagus without pt sensation:  thin  liquid swallow aided clearance but with mild retrograde  propulsion of liquids.  Barium tablet appeared to initially lodge  above CP -transiting into esophagus with reflexive dry swallow  but again lodged at mid-esophagus.  Pt did not sense pharyngeal  or esophageal stasis.  Pt required many boluses of pudding, water  and thin barium to push tablet into stomach.  Findings appear  consistent with dysmotility but radiologist not present to  confirm.      Functional Assessment Tool Used: mbs, clinical judgement Functional Limitations: Swallowing Swallow Current Status (K5997): At least 20 percent but less than  40 percent impaired, limited or restricted Swallow Goal Status [phone]): At least 20 percent but less than 40  percent impaired, limited or restricted Swallow Discharge Status [phone]): At least 20 percent but less  than 40 percent impaired, limited or restricted    Luanna Salk, [place] [facility] SLP [phone]     Assessment & Plan:   Annasophia was seen today for abdominal pain and edema.  Diagnoses and all orders for this visit:  Diabetes mellitus without complication Orders: -     POCT glucose (manual entry)  Chronic kidney disease, stage 3 (moderate) Orders: -     CMP14+EGFR  Slow transit constipation Orders: -     CMP14+EGFR  Other orders -     omeprazole (PRILOSEC) 40 MG capsule; Take 1 capsule (40 mg total) by mouth daily. -     Linaclotide (LINZESS) 145 MCG CAPS capsule; Take 1 capsule (145 mcg total) by mouth daily. For constipation   I have discontinued Ms. Kernen's donepezil. I am also having her start on  omeprazole and Linaclotide. Additionally, I am having her maintain her ursodiol, travoprost (benzalkonium), carboxymethylcellulose, cromolyn, simethicone, trimethoprim-polymyxin b, Vitamin D (Ergocalciferol), amLODipine, nebivolol, furosemide, levocetirizine, BOOST DIABETIC, Raised Toilet Seat, Menthol-Zinc Oxide, fluticasone, glimepiride, ELIQUIS, and CRESTOR.  Meds ordered this encounter  Medications  . omeprazole (PRILOSEC) 40 MG capsule    Sig: Take 1 capsule (40 mg total) by mouth daily.    Dispense:  30 capsule    Refill:  2  . Linaclotide (LINZESS) 145 MCG CAPS capsule    Sig: Take 1 capsule (145 mcg total) by mouth daily. For constipation    Dispense:  30 capsule    Refill:  5   Edema is believed to be peripheral vascular disease.  Follow-up: Return in about 2 weeks (around 09/22/2014) for diabetes, constipation.  Claretta Fraise, M.D.

## 2014-09-09 LAB — CMP14+EGFR
A/G RATIO: 1.6 (ref 1.1–2.5)
ALK PHOS: 87 IU/L (ref 39–117)
ALT: 8 IU/L (ref 0–32)
AST: 20 IU/L (ref 0–40)
Albumin: 4.1 g/dL (ref 3.5–4.7)
BUN/Creatinine Ratio: 15 (ref 11–26)
BUN: 18 mg/dL (ref 8–27)
Bilirubin Total: 0.6 mg/dL (ref 0.0–1.2)
CO2: 23 mmol/L (ref 18–29)
CREATININE: 1.18 mg/dL — AB (ref 0.57–1.00)
Calcium: 9.8 mg/dL (ref 8.7–10.3)
Chloride: 97 mmol/L (ref 97–108)
GFR calc Af Amer: 47 mL/min/{1.73_m2} — ABNORMAL LOW (ref >59)
GFR calc non Af Amer: 41 mL/min/{1.73_m2} — ABNORMAL LOW (ref >59)
GLOBULIN, TOTAL: 2.5 g/dL (ref 1.5–4.5)
Glucose: 135 mg/dL — ABNORMAL HIGH (ref 65–99)
Potassium: 3.1 mmol/L — ABNORMAL LOW (ref 3.5–5.2)
SODIUM: 144 mmol/L (ref 134–144)
Total Protein: 6.6 g/dL (ref 6.0–8.5)

## 2014-09-11 ENCOUNTER — Telehealth: Payer: Self-pay | Admitting: Family Medicine

## 2014-09-12 NOTE — Telephone Encounter (Signed)
-----   Message from Claretta Fraise, MD sent at 09/10/2014  2:03 PM EDT ----- Jeri Modena, yOUR POTASSIUM IS TOO LOW. yOU NEED TO TAKE A POTASSIUM SUPPLEMENT. i AM SENDING IN A PRESCRIPTION. Best Regards, Claretta Fraise, M.D.

## 2014-09-12 NOTE — Telephone Encounter (Signed)
Pt notified of results Verbalizes understanding 

## 2014-09-28 ENCOUNTER — Ambulatory Visit (INDEPENDENT_AMBULATORY_CARE_PROVIDER_SITE_OTHER): Payer: Medicare Other | Admitting: Family Medicine

## 2014-09-28 ENCOUNTER — Encounter: Payer: Self-pay | Admitting: Family Medicine

## 2014-09-28 VITALS — BP 157/58 | HR 56 | Temp 97.4°F | Ht 63.0 in | Wt 101.8 lb

## 2014-09-28 DIAGNOSIS — I10 Essential (primary) hypertension: Secondary | ICD-10-CM

## 2014-09-28 DIAGNOSIS — E119 Type 2 diabetes mellitus without complications: Secondary | ICD-10-CM

## 2014-09-28 DIAGNOSIS — K5901 Slow transit constipation: Secondary | ICD-10-CM

## 2014-09-28 MED ORDER — MEGESTROL ACETATE 400 MG/10ML PO SUSP: 400.0000 mg | Freq: Two times a day (BID) | ORAL | Status: DC

## 2014-09-28 MED ORDER — LINACLOTIDE 290 MCG PO CAPS: 290.0000 ug | ORAL_CAPSULE | Freq: Every day | ORAL | Status: DC

## 2014-09-28 NOTE — Progress Notes (Signed)
Subjective:  Patient ID: Stacey Brown, female    DOB: [birth year]  Age: 81 y.o. MRN: [id number]  CC: Constipation   HPI Stacey Brown presents for Eats like a bird. Not much appetite.Constipated . BMs are infrequent 2-3 days between. Not painful. Can be hard. Feels bloated between, though. Some improvement with linzess prescribed at last visit. Not sufficient.   History Stacey Brown has a past medical history of Abnormal liver function test; Atrial fibrillation; Chronic anticoagulation; History of DVT of lower extremity; Primary biliary cirrhosis; H/O TB (tuberculosis); Esophageal stricture; Macular degeneration; ASCVD (arteriosclerotic cardiovascular disease); Allergy; Arthritis; Diabetes mellitus without complication; Glaucoma; Hyperlipidemia; Chronic kidney disease; Osteoporosis; Cataract; and GERD (gastroesophageal reflux disease).   She has past surgical history that includes Abdominal hysterectomy; Minor hemorrhoidectomy; Left breast biopsy; Cholecystectomy; Herniography; Right lung biopsy; Right hip replacement; Liver biopsy; and Hernia repair.   Her family history includes Breast cancer in her other; Cancer in her father; Cirrhosis in her other; Diabetes in her daughter and mother; Heart disease in her brother and mother; Hyperlipidemia in her daughter; Kidney disease in her cousin. There is no history of Colon cancer.She reports that she has never smoked. She has never used smokeless tobacco. She reports that she does not drink alcohol or use illicit drugs.  Outpatient Prescriptions Prior to Visit  Medication Sig Dispense Refill  . amLODipine (NORVASC) 5 MG tablet Take 1 tablet (5 mg total) by mouth daily. 90 tablet 3  . carboxymethylcellulose (REFRESH PLUS) 0.5 % SOLN 1 drop 3 (three) times daily as needed.    . CRESTOR 40 MG tablet TAKE 1 TABLET (40 MG TOTAL) BY MOUTH DAILY. 90 tablet 0  . cromolyn (OPTICROM) 4 % ophthalmic solution     . ELIQUIS 2.5 MG TABS tablet TAKE ONE TABLET BY  MOUTH TWICE DAILY 60 tablet 1  . fluticasone (FLONASE) 50 MCG/ACT nasal spray Place 2 sprays into both nostrils daily. 16 g 6  . furosemide (LASIX) 20 MG tablet TAKE ONE TABLET BY MOUTH ONE TIME DAILY 30 tablet 4  . glimepiride (AMARYL) 2 MG tablet TAKE ONE TABLET BY MOUTH ONE  TIME DAILY BEFORE BREAKFAST 30 tablet 1  . levocetirizine (XYZAL) 5 MG tablet TAKE ONE TABLET BY MOUTH IN THE EVENING 30 tablet 4  . Menthol-Zinc Oxide (CALMOSEPTINE) 0.44-20.6 % OINT Use BID on bottom and as needed 71 g 0  . Misc. Devices (RAISED TOILET SEAT) MISC Use as directed.  Dx:  High fall risk R 29.6 and osteoporosis M 81.0 1 each 0  . nebivolol (BYSTOLIC) 5 MG tablet Take 1 tablet (5 mg total) by mouth daily. 90 tablet 3  . Nutritional Supplements (BOOST DIABETIC) LIQD Take 1 Can by mouth daily. 237 mL 2  . omeprazole (PRILOSEC) 40 MG capsule Take 1 capsule (40 mg total) by mouth daily. 30 capsule 2  . simethicone (MYLICON) 433 MG chewable tablet Chew 125 mg by mouth every 6 (six) hours as needed for flatulence.    . travoprost, benzalkonium, (TRAVATAN) 0.004 % ophthalmic solution Place 1 drop into both eyes at bedtime.    Marland Kitchen trimethoprim-polymyxin b (POLYTRIM) ophthalmic solution Place 1 drop into both eyes every 6 (six) hours. 10 mL 1  . ursodiol (ACTIGALL) 300 MG capsule TAKE ONE CAPSULE BY MOUTH TWICE DAILY 60 capsule 11  . Vitamin D, Ergocalciferol, (DRISDOL) 50000 UNITS CAPS capsule Take 1 capsule (50,000 Units total) by mouth every 7 (seven) days. 30 capsule 6  . Linaclotide (LINZESS) 145 MCG CAPS  capsule Take 1 capsule (145 mcg total) by mouth daily. For constipation 30 capsule 5   No facility-administered medications prior to visit.    ROS Review of Systems  Constitutional: Positive for appetite change (decreased). Negative for fever, chills, diaphoresis, fatigue and unexpected weight change.  HENT: Negative for congestion, ear pain, hearing loss, postnasal drip, rhinorrhea, sneezing, sore throat and  trouble swallowing.   Eyes: Negative for pain.  Respiratory: Negative for cough, chest tightness and shortness of breath.   Cardiovascular: Negative for chest pain and palpitations.  Gastrointestinal: Positive for constipation. Negative for nausea, vomiting, abdominal pain and diarrhea.  Genitourinary: Negative for dysuria, frequency and menstrual problem.  Skin: Negative for rash.  Neurological: Negative for dizziness, weakness, numbness and headaches.    Objective:  BP 157/58 mmHg  Pulse 56  Temp(Src) 97.4 F (36.3 C) (Oral)  Ht _0  (1.6 m)  Wt 101 lb 12.8 oz (46.176 kg)  BMI 18.04 kg/m2  BP Readings from Last 3 Encounters:  09/28/14 157/58  09/08/14 150/69  07/02/14 152/68    Wt Readings from Last 3 Encounters:  09/28/14 101 lb 12.8 oz (46.176 kg)  09/08/14 105 lb 6.4 oz (47.809 kg)  07/02/14 113 lb 12.8 oz (51.619 kg)     Physical Exam  Constitutional: She is oriented to person, place, and time.  Debilitated, cachectic  HENT:  Head: Normocephalic and atraumatic.  Right Ear: External ear normal.  Left Ear: External ear normal.  Nose: Nose normal.  Mouth/Throat: Oropharynx is clear and moist.  Eyes: Conjunctivae and EOM are normal. Pupils are equal, round, and reactive to light.  Neck: Normal range of motion. Neck supple. No thyromegaly present.  Cardiovascular: Normal rate, regular rhythm and normal heart sounds.   No murmur heard. Pulmonary/Chest: Effort normal and breath sounds normal. No respiratory distress. She has no wheezes. She has no rales.  Abdominal: Soft. Bowel sounds are normal. She exhibits distension. There is no tenderness.  Lymphadenopathy:    She has no cervical adenopathy.  Neurological: She is alert and oriented to person, place, and time. She has normal reflexes.  Skin: Skin is warm and dry.  Psychiatric: She has a normal mood and affect. Her behavior is normal. Judgment and thought content normal.    Lab Results  Component Value Date     HGBA1C 7.1 07/02/2014   HGBA1C 7.1 03/12/2014   HGBA1C 6.5 03/09/2013    Lab Results  Component Value Date   WBC 7.4 01/27/2014   HGB 13.1 01/27/2014   HCT 41.4 01/27/2014   PLT 146* 08/25/2007   GLUCOSE 159* 09/28/2014   CHOL 112 07/02/2014   TRIG 249* 07/02/2014   HDL 34* 07/02/2014   LDLCALC 28 07/02/2014   ALT 8 09/08/2014   AST 20 09/08/2014   NA 145* 09/28/2014   K 2.8* 09/28/2014   CL 95* 09/28/2014   CREATININE 1.76* 09/28/2014   BUN 26 09/28/2014   CO2 31* 09/28/2014   TSH 1.750 09/28/2014   INR 2.2 07/20/2013   HGBA1C 7.1 07/02/2014    Dg Swallowing Func-speech Pathology  04/01/2012   Macario Golds, CCC-SLP     04/01/2012  5:16 PM Objective Swallowing Evaluation: Modified Barium Swallowing Study   Patient Details  Name: Stacey Brown MRN: [id number] Date of Birth: 1923/01/05  Today's Date: 04/01/2012 Time: 1027-2536 SLP Time Calculation (min): 63 min  Past Medical History:  Past Medical History  Diagnosis Date  . Abnormal liver function test   . Arthritis   .  Atrial fibrillation   . Chronic anticoagulation   . History of DVT of lower extremity    Past Surgical History:  Past Surgical History  Procedure Laterality Date  . Abdominal hysterectomy    . Minor hemorrhoidectomy    . Left breast biopsy    . Cholecystectomy    . Herniography    . Right lung biopsy    . Right hip replacement    . Liver biopsy     HPI:  81 yo female referred by [facility] for MBS due to pt aspirating  on esophagram Mar 18, 2012.  PMH + for dysphagia, Afib and remote  DVT, HLD, HTN.  Pt denies significant weight loss but does admit  to recurrent colds. Medication list includes Norvasc, Lipitor,  Lasix, Amaryl, Bystolic, Prilosec, Coumadin, Actigall, Travatan.   Pt previously had undergone a bronch with removal of lettuce from  her lung when she was in her 70's per her daughter's statement.   She denies ever requiring heimlich maneuver.      Dysphagia most notably worsened since last summer when pt   "choked" on robitussin and it caused a "burn", per her statement.   She admits to decreased overall body strength since last summer  as well.  Pt describes sensation of food sticking in throat  requiring her to hock it up to swallow again.  Description  depicts more problems with food "sticking" and water causing her  to "strangle/choke".  Pt also admits to liquids coming up in  esophagus and frequent belching.  Large pills will often stick in  throat requiring pt to swallow food and carbonated beverages to  push it down per pt.  In addition, pt pointed to distal esophagus  stating sometimes she feels like "it won't go down."    Pt eats  alone at times per her statement.      Assessment / Plan / Recommendation Clinical Impression  Dysphagia Diagnosis: Moderate pharyngeal phase  dysphagia;Moderate cervical esophageal phase dysphagia;Suspected  primary esophageal dysphagia  Clinical impression: Pt presents with moderate pharyngeal and  cervical esophageal dysphagia as well as suspected primary  esophageal issues.    Pt did not aspirate any consistency tested, but did demonstrate  laryngeal penetration of thin and nectar liquids due to decr  laryngeal elevation/closure and decreased UES clearance.    Chin  tuck posture prevented laryngeal penetration and reflexive throat  clearing removed mild amount of penetration.  Epiglottic  deflection is compromised and pt's epiglottis is small and in  anterior position decreasing vallecular space.  Pt conduct  multiple swallows with every bite/sip while holding her breath to  decrease pharyngeal stasis.  Even with multiple reflexive  swallows, pt continued with mild vallecular stasis noted across  consistencies without pt awareness.  Advised pt to follow solids  with liquids and conduct chin tuck with liquids and intermittent  dry swallows.  Further advised if pt sensed stasis to take rest  break.  Multiple small meals daily may help pt to compensate for  multifactorial deficits.     Suspect pt's pharyngeal symptoms are primarily secondary to  esophageal dysphagia.  Thoroughly educated pt and daughter Bertram Millard  to findings and recommendations and suspicion of chronic  dysphagia with decreased functional reserve due to aging.  Stacey Brown  was able to teach back information but pt was not and daughter  states she has to repeat things to her mother frequently- ?  cognition.  Pt's cognition may negatively impact her ability to  follow compensation strategies.  If pt becomes ill or severely  deconditioned, her dysphagia may become severely exacerbated with  worsening pulmonary ramifications.  Advised family to learn  hemilch manuever for emergent use.      Treatment Recommendation    xerostomia tips provided   Diet Recommendation Dysphagia 3 (Mechanical Soft);Thin liquid  (extra gravies, sauces, casserole style foods easier)   Liquid Administration via: Cup;Straw Medication Administration: Crushed with puree (whole if  contraindicated) Supervision: Full supervision/cueing for compensatory strategies Compensations: Slow rate;Small sips/bites;Follow solids with  liquid (start meal with drinks) Postural Changes and/or Swallow Maneuvers: Chin tuck    Other  Recommendations Oral Care Recommendations: Oral care  before and after PO   Follow Up Recommendations  None    Frequency and Duration     n/a   Pertinent Vitals/Pain Appeared comfortable    SLP Swallow Goals     General Date of Onset: 04/01/12 HPI: 81 yo female referred by [facility] for MBS due to pt  aspirating on esophagram Mar 18, 2012.  PMH + for dysphagia, Afib  and remote DVT, HLD, HTN.  Pt reports dysphagia most notably  since last summer when she had "choked" on robitussin.  She  reports problems worsening since then, but admits to decreased  overall strength.  Pt denies significant weight loss but does  admit to recurrent colds. Medication list includes Norvasc,  Lipitor, Lasix, Amaryl, Bystolic, Prilosec, Coumadin, Actigall,  Travatan.  Pt  previously had undergone a bronch with removal of  lettuce from her lung when she was in her 70's per her daughter's  statement.  Type of Study: Modified Barium Swallowing Study Reason for Referral: Objectively evaluate swallowing function Previous Swallow Assessment: Pt had a previous MBS in 2001  showing penetration of liquids that was prevented with chin tuck  and vallecular pooling, Esophagram 03/18/12 showed penetration with  individual swallows, small Zenker's diverticulum - study was  ceased d/t penetration.   Diet Prior to this Study: Dysphagia 3 (soft);Thin liquids History of Recent Intubation: No Behavior/Cognition: Alert;Cooperative Oral Cavity - Dentition: Adequate natural dentition Oral Motor / Sensory Function: Within functional limits Self-Feeding Abilities: Able to feed self Patient Positioning: Upright in chair Baseline Vocal Quality: Clear Volitional Cough: Strong Volitional Swallow: Able to elicit Anatomy: Within functional limits Pharyngeal Secretions:  (pt complains of pharyngeal secretion  retention)    Reason for Referral Objectively evaluate swallowing function   Oral Phase Oral Preparation/Oral Phase Oral Phase: WFL Oral Phase - Comment Oral Phase - Comment: pt demonstrated piecemeal deglutition,  suspect  this is compensatory for years of pharyngeal and  esophageal dysphagia   Pharyngeal Phase Pharyngeal Phase Pharyngeal Phase: Impaired Pharyngeal - Nectar Pharyngeal - Nectar Teaspoon: Penetration/Aspiration during  swallow;Pharyngeal residue - valleculae;Reduced airway/laryngeal  closure;Reduced laryngeal elevation;Reduced epiglottic  inversion;Reduced anterior laryngeal mobility;Reduced pharyngeal  peristalsis Penetration/Aspiration details (nectar teaspoon): Material enters  airway, remains ABOVE vocal cords and not ejected out Pharyngeal - Nectar Cup: Reduced tongue base retraction;Reduced  airway/laryngeal closure;Reduced laryngeal elevation;Reduced  anterior laryngeal mobility;Reduced  epiglottic inversion;Reduced  pharyngeal peristalsis;Lateral channel  residue;Penetration/Aspiration during swallow Penetration/Aspiration details (nectar cup): Material enters  airway, remains ABOVE vocal cords and not ejected out Pharyngeal - Thin Pharyngeal - Thin Teaspoon: Pharyngeal residue -  valleculae;Reduced anterior laryngeal mobility;Reduced epiglottic  inversion;Reduced pharyngeal peristalsis;Reduced laryngeal  elevation;Reduced airway/laryngeal closure Pharyngeal - Thin Cup: Penetration/Aspiration during  swallow;Reduced epiglottic inversion;Reduced anterior laryngeal  mobility;Reduced pharyngeal peristalsis;Reduced laryngeal  elevation;Reduced airway/laryngeal closure Penetration/Aspiration details (thin cup): Material enters  airway, remains ABOVE vocal cords and not ejected out Pharyngeal - Thin Straw: Reduced airway/laryngeal closure;Reduced  laryngeal elevation;Reduced anterior laryngeal mobility;Reduced  epiglottic inversion;Penetration/Aspiration during swallow Penetration/Aspiration details (thin straw): Material enters  airway, CONTACTS cords and not ejected out Pharyngeal - Solids Pharyngeal - Puree: Pharyngeal residue - valleculae;Reduced  epiglottic inversion;Reduced anterior laryngeal mobility;Reduced  pharyngeal peristalsis;Premature spillage to valleculae Pharyngeal - Regular: Reduced epiglottic inversion;Reduced  pharyngeal peristalsis;Reduced anterior laryngeal  mobility;Pharyngeal residue - valleculae;Premature spillage to  valleculae Pharyngeal - Pill: Pharyngeal residue - cp segment (taken with  pudding) Pharyngeal Phase - Comment Pharyngeal Comment: barium tablet lodged at CP region:  reflexive  dry swallow pushed it further into esophagus, chin tuck posture  eliminated penetration of thin liquid but doubt pt will complete  routinely  Cervical Esophageal Phase    GO    Cervical Esophageal Phase Cervical Esophageal Phase: Impaired Cervical Esophageal Phase - Nectar Nectar Teaspoon:  Prominent cricopharyngeal segment;Reduced  cricopharyngeal relaxation Nectar Cup: Reduced cricopharyngeal relaxation;Prominent  cricopharyngeal segment Cervical Esophageal Phase - Thin Thin Teaspoon: Reduced cricopharyngeal relaxation;Prominent  cricopharyngeal segment Thin Cup: Reduced cricopharyngeal relaxation;Prominent  cricopharyngeal segment Thin Straw: Prominent cricopharyngeal segment;Reduced  cricopharyngeal relaxation Cervical Esophageal Phase - Solids Puree: Reduced cricopharyngeal relaxation;Prominent  cricopharyngeal segment Regular: Prominent cricopharyngeal segment;Reduced  cricopharyngeal relaxation Pill: Reduced cricopharyngeal relaxation;Prominent  cricopharyngeal segment Cervical Esophageal Phase - Comment Cervical Esophageal Comment: appearance of slow clearance  distally after a few boluses of nectar thick liquids,  stasis of  pudding throughout entire esophagus without pt sensation:  thin  liquid swallow aided clearance but with mild retrograde  propulsion of liquids.  Barium tablet appeared to initially lodge  above CP -transiting into esophagus with reflexive dry swallow  but again lodged at mid-esophagus.  Pt did not sense pharyngeal  or esophageal stasis.  Pt required many boluses of pudding, water  and thin barium to push tablet into stomach.  Findings appear  consistent with dysmotility but radiologist not present to  confirm.      Functional Assessment Tool Used: mbs, clinical judgement Functional Limitations: Swallowing Swallow Current Status (O2703): At least 20 percent but less than  40 percent impaired, limited or restricted Swallow Goal Status [phone]): At least 20 percent but less than 40  percent impaired, limited or restricted Swallow Discharge Status [phone]): At least 20 percent but less  than 40 percent impaired, limited or restricted    Luanna Salk, [facility] SLP [phone]     Assessment & Plan:   Stacey Brown was seen today for constipation.  Diagnoses and all orders for this  visit:  Slow transit constipation -     Thyroid Panel With TSH  Essential hypertension, benign -     BMP8+EGFR -     Thyroid Panel With TSH  Diabetes mellitus without complication -     VEL3+YBOF -     Thyroid Panel With TSH  Other orders -     Linaclotide (LINZESS) 290 MCG CAPS capsule; Take 1 capsule (290 mcg total) by mouth daily. -     megestrol (MEGACE) 400 MG/10ML suspension; Take 10 mLs (400 mg total) by mouth 2 (two) times daily.   I have discontinued Stacey Brown's Linaclotide. I am also having her start on Linaclotide and megestrol. Additionally, I am having her maintain her ursodiol, travoprost (benzalkonium), carboxymethylcellulose, cromolyn, simethicone, trimethoprim-polymyxin b, Vitamin D (Ergocalciferol), amLODipine, nebivolol, furosemide, levocetirizine, BOOST DIABETIC, Raised Toilet Seat, Menthol-Zinc Oxide, fluticasone, glimepiride, ELIQUIS, CRESTOR, and omeprazole.  Meds ordered this encounter  Medications  .  Linaclotide (LINZESS) 290 MCG CAPS capsule    Sig: Take 1 capsule (290 mcg total) by mouth daily.    Dispense:  30 capsule    Refill:  2  . megestrol (MEGACE) 400 MG/10ML suspension    Sig: Take 10 mLs (400 mg total) by mouth 2 (two) times daily.    Dispense:  600 mL    Refill:  2     Follow-up: Return in about 1 month (around 10/29/2014) for diabetes.  Claretta Fraise, M.D. CEA

## 2014-09-29 LAB — THYROID PANEL WITH TSH
FREE THYROXINE INDEX: 1.8 (ref 1.2–4.9)
T3 UPTAKE RATIO: 30 % (ref 24–39)
T4 TOTAL: 6 ug/dL (ref 4.5–12.0)
TSH: 1.75 u[IU]/mL (ref 0.450–4.500)

## 2014-09-29 LAB — BMP8+EGFR
BUN/Creatinine Ratio: 15 (ref 11–26)
BUN: 26 mg/dL (ref 8–27)
CALCIUM: 9.9 mg/dL (ref 8.7–10.3)
CO2: 31 mmol/L — AB (ref 18–29)
CREATININE: 1.76 mg/dL — AB (ref 0.57–1.00)
Chloride: 95 mmol/L — ABNORMAL LOW (ref 97–108)
GFR calc Af Amer: 29 mL/min/{1.73_m2} — ABNORMAL LOW (ref >59)
GFR calc non Af Amer: 25 mL/min/{1.73_m2} — ABNORMAL LOW (ref >59)
GLUCOSE: 159 mg/dL — AB (ref 65–99)
Potassium: 2.8 mmol/L — ABNORMAL LOW (ref 3.5–5.2)
SODIUM: 145 mmol/L — AB (ref 134–144)

## 2014-09-30 ENCOUNTER — Other Ambulatory Visit: Payer: Self-pay | Admitting: Family Medicine

## 2014-09-30 MED ORDER — POTASSIUM CHLORIDE CRYS ER 20 MEQ PO TBCR: 20.0000 meq | EXTENDED_RELEASE_TABLET | Freq: Every day | ORAL | Status: DC

## 2014-10-04 ENCOUNTER — Other Ambulatory Visit (INDEPENDENT_AMBULATORY_CARE_PROVIDER_SITE_OTHER): Payer: Medicare Other

## 2014-10-04 DIAGNOSIS — E876 Hypokalemia: Secondary | ICD-10-CM

## 2014-10-04 NOTE — Addendum Note (Signed)
Addended by: Wyline Mood on: 10/04/2014 06:10 PM   Modules accepted: Orders

## 2014-10-04 NOTE — Progress Notes (Signed)
Lab only 

## 2014-10-04 NOTE — Addendum Note (Signed)
Addended by: Wyline Mood on: 10/04/2014 06:08 PM   Modules accepted: Orders

## 2014-10-05 LAB — BMP8+EGFR
BUN/Creatinine Ratio: 15 (ref 11–26)
BUN: 25 mg/dL (ref 8–27)
CALCIUM: 9.9 mg/dL (ref 8.7–10.3)
CHLORIDE: 98 mmol/L (ref 97–108)
CO2: 22 mmol/L (ref 18–29)
Creatinine, Ser: 1.65 mg/dL — ABNORMAL HIGH (ref 0.57–1.00)
GFR calc Af Amer: 32 mL/min/{1.73_m2} — ABNORMAL LOW (ref >59)
GFR calc non Af Amer: 27 mL/min/{1.73_m2} — ABNORMAL LOW (ref >59)
GLUCOSE: 138 mg/dL — AB (ref 65–99)
POTASSIUM: 3.8 mmol/L (ref 3.5–5.2)
Sodium: 139 mmol/L (ref 134–144)

## 2014-10-07 ENCOUNTER — Other Ambulatory Visit: Payer: Self-pay | Admitting: Family

## 2014-10-08 NOTE — Telephone Encounter (Signed)
68.5 was Vit. D level on 07/02/14

## 2014-10-09 ENCOUNTER — Other Ambulatory Visit: Payer: Self-pay | Admitting: Family

## 2014-11-01 DIAGNOSIS — B9620 Unspecified Escherichia coli [E. coli] as the cause of diseases classified elsewhere: Secondary | ICD-10-CM | POA: Diagnosis not present

## 2014-11-01 DIAGNOSIS — R0602 Shortness of breath: Secondary | ICD-10-CM | POA: Diagnosis not present

## 2014-11-01 DIAGNOSIS — G8220 Paraplegia, unspecified: Secondary | ICD-10-CM | POA: Diagnosis not present

## 2014-11-01 DIAGNOSIS — I351 Nonrheumatic aortic (valve) insufficiency: Secondary | ICD-10-CM | POA: Diagnosis not present

## 2014-11-01 DIAGNOSIS — H409 Unspecified glaucoma: Secondary | ICD-10-CM | POA: Diagnosis not present

## 2014-11-01 DIAGNOSIS — R1312 Dysphagia, oropharyngeal phase: Secondary | ICD-10-CM | POA: Diagnosis not present

## 2014-11-01 DIAGNOSIS — R1310 Dysphagia, unspecified: Secondary | ICD-10-CM | POA: Diagnosis not present

## 2014-11-01 DIAGNOSIS — K219 Gastro-esophageal reflux disease without esophagitis: Secondary | ICD-10-CM | POA: Diagnosis not present

## 2014-11-01 DIAGNOSIS — I472 Ventricular tachycardia: Secondary | ICD-10-CM | POA: Diagnosis not present

## 2014-11-01 DIAGNOSIS — N19 Unspecified kidney failure: Secondary | ICD-10-CM | POA: Diagnosis not present

## 2014-11-01 DIAGNOSIS — N390 Urinary tract infection, site not specified: Secondary | ICD-10-CM | POA: Diagnosis not present

## 2014-11-01 DIAGNOSIS — R279 Unspecified lack of coordination: Secondary | ICD-10-CM | POA: Diagnosis not present

## 2014-11-01 DIAGNOSIS — E1129 Type 2 diabetes mellitus with other diabetic kidney complication: Secondary | ICD-10-CM | POA: Diagnosis not present

## 2014-11-01 DIAGNOSIS — I482 Chronic atrial fibrillation: Secondary | ICD-10-CM | POA: Diagnosis not present

## 2014-11-01 DIAGNOSIS — Z881 Allergy status to other antibiotic agents status: Secondary | ICD-10-CM | POA: Diagnosis not present

## 2014-11-01 DIAGNOSIS — R269 Unspecified abnormalities of gait and mobility: Secondary | ICD-10-CM | POA: Diagnosis not present

## 2014-11-01 DIAGNOSIS — K3184 Gastroparesis: Secondary | ICD-10-CM | POA: Diagnosis not present

## 2014-11-01 DIAGNOSIS — K5900 Constipation, unspecified: Secondary | ICD-10-CM | POA: Diagnosis not present

## 2014-11-01 DIAGNOSIS — I959 Hypotension, unspecified: Secondary | ICD-10-CM | POA: Diagnosis not present

## 2014-11-01 DIAGNOSIS — M6281 Muscle weakness (generalized): Secondary | ICD-10-CM | POA: Diagnosis not present

## 2014-11-01 DIAGNOSIS — Z7401 Bed confinement status: Secondary | ICD-10-CM | POA: Diagnosis not present

## 2014-11-01 DIAGNOSIS — E119 Type 2 diabetes mellitus without complications: Secondary | ICD-10-CM | POA: Diagnosis not present

## 2014-11-01 DIAGNOSIS — E785 Hyperlipidemia, unspecified: Secondary | ICD-10-CM | POA: Diagnosis not present

## 2014-11-01 DIAGNOSIS — A4151 Sepsis due to Escherichia coli [E. coli]: Secondary | ICD-10-CM | POA: Diagnosis not present

## 2014-11-01 DIAGNOSIS — Z888 Allergy status to other drugs, medicaments and biological substances status: Secondary | ICD-10-CM | POA: Diagnosis not present

## 2014-11-01 DIAGNOSIS — R748 Abnormal levels of other serum enzymes: Secondary | ICD-10-CM | POA: Diagnosis not present

## 2014-11-01 DIAGNOSIS — I4891 Unspecified atrial fibrillation: Secondary | ICD-10-CM | POA: Diagnosis not present

## 2014-11-01 DIAGNOSIS — I517 Cardiomegaly: Secondary | ICD-10-CM | POA: Diagnosis not present

## 2014-11-01 DIAGNOSIS — I10 Essential (primary) hypertension: Secondary | ICD-10-CM | POA: Diagnosis not present

## 2014-11-01 DIAGNOSIS — N289 Disorder of kidney and ureter, unspecified: Secondary | ICD-10-CM | POA: Diagnosis not present

## 2014-11-01 DIAGNOSIS — R404 Transient alteration of awareness: Secondary | ICD-10-CM | POA: Diagnosis not present

## 2014-11-01 DIAGNOSIS — E876 Hypokalemia: Secondary | ICD-10-CM | POA: Diagnosis not present

## 2014-11-01 DIAGNOSIS — R531 Weakness: Secondary | ICD-10-CM | POA: Diagnosis not present

## 2014-11-01 DIAGNOSIS — E1143 Type 2 diabetes mellitus with diabetic autonomic (poly)neuropathy: Secondary | ICD-10-CM | POA: Diagnosis not present

## 2014-11-01 DIAGNOSIS — F0390 Unspecified dementia without behavioral disturbance: Secondary | ICD-10-CM | POA: Diagnosis not present

## 2014-11-01 DIAGNOSIS — N179 Acute kidney failure, unspecified: Secondary | ICD-10-CM | POA: Diagnosis not present

## 2014-11-01 DIAGNOSIS — R5381 Other malaise: Secondary | ICD-10-CM | POA: Diagnosis not present

## 2014-11-01 DIAGNOSIS — E875 Hyperkalemia: Secondary | ICD-10-CM | POA: Diagnosis not present

## 2014-11-07 DIAGNOSIS — L89610 Pressure ulcer of right heel, unstageable: Secondary | ICD-10-CM | POA: Diagnosis not present

## 2014-11-07 DIAGNOSIS — N390 Urinary tract infection, site not specified: Secondary | ICD-10-CM | POA: Diagnosis not present

## 2014-11-07 DIAGNOSIS — K3184 Gastroparesis: Secondary | ICD-10-CM | POA: Diagnosis not present

## 2014-11-07 DIAGNOSIS — N19 Unspecified kidney failure: Secondary | ICD-10-CM | POA: Diagnosis not present

## 2014-11-07 DIAGNOSIS — R1312 Dysphagia, oropharyngeal phase: Secondary | ICD-10-CM | POA: Diagnosis not present

## 2014-11-07 DIAGNOSIS — F0390 Unspecified dementia without behavioral disturbance: Secondary | ICD-10-CM | POA: Diagnosis not present

## 2014-11-07 DIAGNOSIS — E785 Hyperlipidemia, unspecified: Secondary | ICD-10-CM | POA: Diagnosis not present

## 2014-11-07 DIAGNOSIS — R279 Unspecified lack of coordination: Secondary | ICD-10-CM | POA: Diagnosis not present

## 2014-11-07 DIAGNOSIS — I4891 Unspecified atrial fibrillation: Secondary | ICD-10-CM | POA: Diagnosis not present

## 2014-11-07 DIAGNOSIS — N179 Acute kidney failure, unspecified: Secondary | ICD-10-CM | POA: Diagnosis not present

## 2014-11-07 DIAGNOSIS — K5900 Constipation, unspecified: Secondary | ICD-10-CM | POA: Diagnosis not present

## 2014-11-07 DIAGNOSIS — K219 Gastro-esophageal reflux disease without esophagitis: Secondary | ICD-10-CM | POA: Diagnosis not present

## 2014-11-07 DIAGNOSIS — A419 Sepsis, unspecified organism: Secondary | ICD-10-CM | POA: Diagnosis not present

## 2014-11-07 DIAGNOSIS — E1129 Type 2 diabetes mellitus with other diabetic kidney complication: Secondary | ICD-10-CM | POA: Diagnosis not present

## 2014-11-07 DIAGNOSIS — Z7401 Bed confinement status: Secondary | ICD-10-CM | POA: Diagnosis not present

## 2014-11-07 DIAGNOSIS — R1310 Dysphagia, unspecified: Secondary | ICD-10-CM | POA: Diagnosis not present

## 2014-11-07 DIAGNOSIS — L89620 Pressure ulcer of left heel, unstageable: Secondary | ICD-10-CM | POA: Diagnosis not present

## 2014-11-07 DIAGNOSIS — H409 Unspecified glaucoma: Secondary | ICD-10-CM | POA: Diagnosis not present

## 2014-11-07 DIAGNOSIS — E119 Type 2 diabetes mellitus without complications: Secondary | ICD-10-CM | POA: Diagnosis not present

## 2014-11-07 DIAGNOSIS — B9620 Unspecified Escherichia coli [E. coli] as the cause of diseases classified elsewhere: Secondary | ICD-10-CM | POA: Diagnosis not present

## 2014-11-07 DIAGNOSIS — R2681 Unsteadiness on feet: Secondary | ICD-10-CM | POA: Diagnosis not present

## 2014-11-07 DIAGNOSIS — R269 Unspecified abnormalities of gait and mobility: Secondary | ICD-10-CM | POA: Diagnosis not present

## 2014-11-07 DIAGNOSIS — M6281 Muscle weakness (generalized): Secondary | ICD-10-CM | POA: Diagnosis not present

## 2014-11-07 DIAGNOSIS — I10 Essential (primary) hypertension: Secondary | ICD-10-CM | POA: Diagnosis not present

## 2014-11-07 DIAGNOSIS — R531 Weakness: Secondary | ICD-10-CM | POA: Diagnosis not present

## 2014-11-08 DIAGNOSIS — N390 Urinary tract infection, site not specified: Secondary | ICD-10-CM | POA: Diagnosis not present

## 2014-11-08 DIAGNOSIS — A419 Sepsis, unspecified organism: Secondary | ICD-10-CM | POA: Diagnosis not present

## 2014-11-08 DIAGNOSIS — K219 Gastro-esophageal reflux disease without esophagitis: Secondary | ICD-10-CM | POA: Diagnosis not present

## 2014-11-08 DIAGNOSIS — N179 Acute kidney failure, unspecified: Secondary | ICD-10-CM | POA: Diagnosis not present

## 2014-11-08 DIAGNOSIS — I10 Essential (primary) hypertension: Secondary | ICD-10-CM | POA: Diagnosis not present

## 2014-11-09 ENCOUNTER — Ambulatory Visit: Payer: Medicare Other | Admitting: Family Medicine

## 2014-11-17 DIAGNOSIS — R531 Weakness: Secondary | ICD-10-CM | POA: Diagnosis not present

## 2014-11-17 DIAGNOSIS — R2681 Unsteadiness on feet: Secondary | ICD-10-CM | POA: Diagnosis not present

## 2014-11-17 DIAGNOSIS — N390 Urinary tract infection, site not specified: Secondary | ICD-10-CM | POA: Diagnosis not present

## 2014-11-17 DIAGNOSIS — F0390 Unspecified dementia without behavioral disturbance: Secondary | ICD-10-CM | POA: Diagnosis not present

## 2014-11-17 DIAGNOSIS — E119 Type 2 diabetes mellitus without complications: Secondary | ICD-10-CM | POA: Diagnosis not present

## 2014-11-21 DIAGNOSIS — R2681 Unsteadiness on feet: Secondary | ICD-10-CM | POA: Diagnosis not present

## 2014-11-21 DIAGNOSIS — L89610 Pressure ulcer of right heel, unstageable: Secondary | ICD-10-CM | POA: Diagnosis not present

## 2014-11-21 DIAGNOSIS — L89620 Pressure ulcer of left heel, unstageable: Secondary | ICD-10-CM | POA: Diagnosis not present

## 2014-11-21 DIAGNOSIS — F0390 Unspecified dementia without behavioral disturbance: Secondary | ICD-10-CM | POA: Diagnosis not present

## 2014-11-21 DIAGNOSIS — R531 Weakness: Secondary | ICD-10-CM | POA: Diagnosis not present

## 2014-11-30 DIAGNOSIS — R531 Weakness: Secondary | ICD-10-CM | POA: Diagnosis not present

## 2014-11-30 DIAGNOSIS — R2681 Unsteadiness on feet: Secondary | ICD-10-CM | POA: Diagnosis not present

## 2014-11-30 DIAGNOSIS — I10 Essential (primary) hypertension: Secondary | ICD-10-CM | POA: Diagnosis not present

## 2014-11-30 DIAGNOSIS — F0390 Unspecified dementia without behavioral disturbance: Secondary | ICD-10-CM | POA: Diagnosis not present

## 2014-11-30 DIAGNOSIS — K219 Gastro-esophageal reflux disease without esophagitis: Secondary | ICD-10-CM | POA: Diagnosis not present

## 2014-12-06 DIAGNOSIS — F0390 Unspecified dementia without behavioral disturbance: Secondary | ICD-10-CM | POA: Diagnosis not present

## 2014-12-06 DIAGNOSIS — L89610 Pressure ulcer of right heel, unstageable: Secondary | ICD-10-CM | POA: Diagnosis not present

## 2014-12-06 DIAGNOSIS — R531 Weakness: Secondary | ICD-10-CM | POA: Diagnosis not present

## 2014-12-06 DIAGNOSIS — L89620 Pressure ulcer of left heel, unstageable: Secondary | ICD-10-CM | POA: Diagnosis not present

## 2014-12-06 DIAGNOSIS — R2681 Unsteadiness on feet: Secondary | ICD-10-CM | POA: Diagnosis not present

## 2014-12-08 ENCOUNTER — Other Ambulatory Visit: Payer: Self-pay | Admitting: Family

## 2014-12-15 DIAGNOSIS — R531 Weakness: Secondary | ICD-10-CM | POA: Diagnosis not present

## 2014-12-15 DIAGNOSIS — E119 Type 2 diabetes mellitus without complications: Secondary | ICD-10-CM | POA: Diagnosis not present

## 2014-12-15 DIAGNOSIS — F0390 Unspecified dementia without behavioral disturbance: Secondary | ICD-10-CM | POA: Diagnosis not present

## 2014-12-15 DIAGNOSIS — I10 Essential (primary) hypertension: Secondary | ICD-10-CM | POA: Diagnosis not present

## 2014-12-15 DIAGNOSIS — I4891 Unspecified atrial fibrillation: Secondary | ICD-10-CM | POA: Diagnosis not present

## 2014-12-20 DIAGNOSIS — F0390 Unspecified dementia without behavioral disturbance: Secondary | ICD-10-CM | POA: Diagnosis not present

## 2014-12-20 DIAGNOSIS — R2681 Unsteadiness on feet: Secondary | ICD-10-CM | POA: Diagnosis not present

## 2014-12-20 DIAGNOSIS — L89610 Pressure ulcer of right heel, unstageable: Secondary | ICD-10-CM | POA: Diagnosis not present

## 2014-12-20 DIAGNOSIS — R531 Weakness: Secondary | ICD-10-CM | POA: Diagnosis not present

## 2014-12-20 DIAGNOSIS — E119 Type 2 diabetes mellitus without complications: Secondary | ICD-10-CM | POA: Diagnosis not present

## 2014-12-27 ENCOUNTER — Telehealth: Payer: Self-pay | Admitting: Family Medicine

## 2014-12-27 DIAGNOSIS — R531 Weakness: Secondary | ICD-10-CM | POA: Diagnosis not present

## 2014-12-27 DIAGNOSIS — R2681 Unsteadiness on feet: Secondary | ICD-10-CM | POA: Diagnosis not present

## 2014-12-27 DIAGNOSIS — F0390 Unspecified dementia without behavioral disturbance: Secondary | ICD-10-CM | POA: Diagnosis not present

## 2014-12-27 DIAGNOSIS — R1310 Dysphagia, unspecified: Secondary | ICD-10-CM | POA: Diagnosis not present

## 2014-12-27 DIAGNOSIS — E119 Type 2 diabetes mellitus without complications: Secondary | ICD-10-CM | POA: Diagnosis not present

## 2014-12-28 DIAGNOSIS — E785 Hyperlipidemia, unspecified: Secondary | ICD-10-CM | POA: Diagnosis not present

## 2014-12-28 DIAGNOSIS — I10 Essential (primary) hypertension: Secondary | ICD-10-CM | POA: Diagnosis not present

## 2014-12-28 DIAGNOSIS — N390 Urinary tract infection, site not specified: Secondary | ICD-10-CM | POA: Diagnosis not present

## 2014-12-28 DIAGNOSIS — H409 Unspecified glaucoma: Secondary | ICD-10-CM | POA: Diagnosis not present

## 2014-12-28 DIAGNOSIS — F0390 Unspecified dementia without behavioral disturbance: Secondary | ICD-10-CM | POA: Diagnosis not present

## 2014-12-28 DIAGNOSIS — E119 Type 2 diabetes mellitus without complications: Secondary | ICD-10-CM | POA: Diagnosis not present

## 2014-12-28 DIAGNOSIS — I4891 Unspecified atrial fibrillation: Secondary | ICD-10-CM | POA: Diagnosis not present

## 2014-12-28 DIAGNOSIS — R269 Unspecified abnormalities of gait and mobility: Secondary | ICD-10-CM | POA: Diagnosis not present

## 2014-12-28 DIAGNOSIS — K5900 Constipation, unspecified: Secondary | ICD-10-CM | POA: Diagnosis not present

## 2014-12-28 DIAGNOSIS — R1312 Dysphagia, oropharyngeal phase: Secondary | ICD-10-CM | POA: Diagnosis not present

## 2014-12-28 DIAGNOSIS — B9620 Unspecified Escherichia coli [E. coli] as the cause of diseases classified elsewhere: Secondary | ICD-10-CM | POA: Diagnosis not present

## 2014-12-28 DIAGNOSIS — M6281 Muscle weakness (generalized): Secondary | ICD-10-CM | POA: Diagnosis not present

## 2014-12-28 DIAGNOSIS — K3184 Gastroparesis: Secondary | ICD-10-CM | POA: Diagnosis not present

## 2014-12-28 DIAGNOSIS — K219 Gastro-esophageal reflux disease without esophagitis: Secondary | ICD-10-CM | POA: Diagnosis not present

## 2014-12-30 DIAGNOSIS — H409 Unspecified glaucoma: Secondary | ICD-10-CM | POA: Diagnosis not present

## 2014-12-30 DIAGNOSIS — N390 Urinary tract infection, site not specified: Secondary | ICD-10-CM | POA: Diagnosis not present

## 2014-12-30 DIAGNOSIS — K219 Gastro-esophageal reflux disease without esophagitis: Secondary | ICD-10-CM | POA: Diagnosis not present

## 2014-12-30 DIAGNOSIS — B9620 Unspecified Escherichia coli [E. coli] as the cause of diseases classified elsewhere: Secondary | ICD-10-CM | POA: Diagnosis not present

## 2014-12-30 DIAGNOSIS — R269 Unspecified abnormalities of gait and mobility: Secondary | ICD-10-CM | POA: Diagnosis not present

## 2014-12-30 DIAGNOSIS — I4891 Unspecified atrial fibrillation: Secondary | ICD-10-CM | POA: Diagnosis not present

## 2014-12-30 DIAGNOSIS — K3184 Gastroparesis: Secondary | ICD-10-CM | POA: Diagnosis not present

## 2014-12-30 DIAGNOSIS — F0390 Unspecified dementia without behavioral disturbance: Secondary | ICD-10-CM | POA: Diagnosis not present

## 2014-12-30 DIAGNOSIS — E119 Type 2 diabetes mellitus without complications: Secondary | ICD-10-CM | POA: Diagnosis not present

## 2014-12-30 DIAGNOSIS — I10 Essential (primary) hypertension: Secondary | ICD-10-CM | POA: Diagnosis not present

## 2014-12-30 DIAGNOSIS — E785 Hyperlipidemia, unspecified: Secondary | ICD-10-CM | POA: Diagnosis not present

## 2014-12-30 DIAGNOSIS — M6281 Muscle weakness (generalized): Secondary | ICD-10-CM | POA: Diagnosis not present

## 2014-12-30 DIAGNOSIS — K5900 Constipation, unspecified: Secondary | ICD-10-CM | POA: Diagnosis not present

## 2014-12-30 DIAGNOSIS — R1312 Dysphagia, oropharyngeal phase: Secondary | ICD-10-CM | POA: Diagnosis not present

## 2014-12-31 DIAGNOSIS — R269 Unspecified abnormalities of gait and mobility: Secondary | ICD-10-CM | POA: Diagnosis not present

## 2014-12-31 DIAGNOSIS — I4891 Unspecified atrial fibrillation: Secondary | ICD-10-CM | POA: Diagnosis not present

## 2014-12-31 DIAGNOSIS — K5900 Constipation, unspecified: Secondary | ICD-10-CM | POA: Diagnosis not present

## 2014-12-31 DIAGNOSIS — M6281 Muscle weakness (generalized): Secondary | ICD-10-CM | POA: Diagnosis not present

## 2014-12-31 DIAGNOSIS — E785 Hyperlipidemia, unspecified: Secondary | ICD-10-CM | POA: Diagnosis not present

## 2014-12-31 DIAGNOSIS — I10 Essential (primary) hypertension: Secondary | ICD-10-CM | POA: Diagnosis not present

## 2014-12-31 DIAGNOSIS — F0390 Unspecified dementia without behavioral disturbance: Secondary | ICD-10-CM | POA: Diagnosis not present

## 2014-12-31 DIAGNOSIS — K219 Gastro-esophageal reflux disease without esophagitis: Secondary | ICD-10-CM | POA: Diagnosis not present

## 2014-12-31 DIAGNOSIS — K3184 Gastroparesis: Secondary | ICD-10-CM | POA: Diagnosis not present

## 2014-12-31 DIAGNOSIS — R1312 Dysphagia, oropharyngeal phase: Secondary | ICD-10-CM | POA: Diagnosis not present

## 2014-12-31 DIAGNOSIS — N390 Urinary tract infection, site not specified: Secondary | ICD-10-CM | POA: Diagnosis not present

## 2014-12-31 DIAGNOSIS — E119 Type 2 diabetes mellitus without complications: Secondary | ICD-10-CM | POA: Diagnosis not present

## 2014-12-31 DIAGNOSIS — B9620 Unspecified Escherichia coli [E. coli] as the cause of diseases classified elsewhere: Secondary | ICD-10-CM | POA: Diagnosis not present

## 2014-12-31 DIAGNOSIS — H409 Unspecified glaucoma: Secondary | ICD-10-CM | POA: Diagnosis not present

## 2015-01-01 DIAGNOSIS — M6281 Muscle weakness (generalized): Secondary | ICD-10-CM | POA: Diagnosis not present

## 2015-01-01 DIAGNOSIS — K3184 Gastroparesis: Secondary | ICD-10-CM | POA: Diagnosis not present

## 2015-01-01 DIAGNOSIS — E785 Hyperlipidemia, unspecified: Secondary | ICD-10-CM | POA: Diagnosis not present

## 2015-01-01 DIAGNOSIS — R1312 Dysphagia, oropharyngeal phase: Secondary | ICD-10-CM | POA: Diagnosis not present

## 2015-01-01 DIAGNOSIS — E119 Type 2 diabetes mellitus without complications: Secondary | ICD-10-CM | POA: Diagnosis not present

## 2015-01-01 DIAGNOSIS — I4891 Unspecified atrial fibrillation: Secondary | ICD-10-CM | POA: Diagnosis not present

## 2015-01-01 DIAGNOSIS — R269 Unspecified abnormalities of gait and mobility: Secondary | ICD-10-CM | POA: Diagnosis not present

## 2015-01-01 DIAGNOSIS — N390 Urinary tract infection, site not specified: Secondary | ICD-10-CM | POA: Diagnosis not present

## 2015-01-01 DIAGNOSIS — B9620 Unspecified Escherichia coli [E. coli] as the cause of diseases classified elsewhere: Secondary | ICD-10-CM | POA: Diagnosis not present

## 2015-01-01 DIAGNOSIS — K219 Gastro-esophageal reflux disease without esophagitis: Secondary | ICD-10-CM | POA: Diagnosis not present

## 2015-01-01 DIAGNOSIS — R531 Weakness: Secondary | ICD-10-CM | POA: Diagnosis not present

## 2015-01-01 DIAGNOSIS — R2681 Unsteadiness on feet: Secondary | ICD-10-CM | POA: Diagnosis not present

## 2015-01-01 DIAGNOSIS — H409 Unspecified glaucoma: Secondary | ICD-10-CM | POA: Diagnosis not present

## 2015-01-01 DIAGNOSIS — I10 Essential (primary) hypertension: Secondary | ICD-10-CM | POA: Diagnosis not present

## 2015-01-01 DIAGNOSIS — K5900 Constipation, unspecified: Secondary | ICD-10-CM | POA: Diagnosis not present

## 2015-01-01 DIAGNOSIS — F0390 Unspecified dementia without behavioral disturbance: Secondary | ICD-10-CM | POA: Diagnosis not present

## 2015-01-02 DIAGNOSIS — K219 Gastro-esophageal reflux disease without esophagitis: Secondary | ICD-10-CM | POA: Diagnosis not present

## 2015-01-02 DIAGNOSIS — I4891 Unspecified atrial fibrillation: Secondary | ICD-10-CM | POA: Diagnosis not present

## 2015-01-02 DIAGNOSIS — K3184 Gastroparesis: Secondary | ICD-10-CM | POA: Diagnosis not present

## 2015-01-02 DIAGNOSIS — N390 Urinary tract infection, site not specified: Secondary | ICD-10-CM | POA: Diagnosis not present

## 2015-01-02 DIAGNOSIS — F0390 Unspecified dementia without behavioral disturbance: Secondary | ICD-10-CM | POA: Diagnosis not present

## 2015-01-02 DIAGNOSIS — B9620 Unspecified Escherichia coli [E. coli] as the cause of diseases classified elsewhere: Secondary | ICD-10-CM | POA: Diagnosis not present

## 2015-01-02 DIAGNOSIS — K5900 Constipation, unspecified: Secondary | ICD-10-CM | POA: Diagnosis not present

## 2015-01-02 DIAGNOSIS — E785 Hyperlipidemia, unspecified: Secondary | ICD-10-CM | POA: Diagnosis not present

## 2015-01-02 DIAGNOSIS — I10 Essential (primary) hypertension: Secondary | ICD-10-CM | POA: Diagnosis not present

## 2015-01-02 DIAGNOSIS — R269 Unspecified abnormalities of gait and mobility: Secondary | ICD-10-CM | POA: Diagnosis not present

## 2015-01-02 DIAGNOSIS — H409 Unspecified glaucoma: Secondary | ICD-10-CM | POA: Diagnosis not present

## 2015-01-02 DIAGNOSIS — E119 Type 2 diabetes mellitus without complications: Secondary | ICD-10-CM | POA: Diagnosis not present

## 2015-01-02 DIAGNOSIS — M6281 Muscle weakness (generalized): Secondary | ICD-10-CM | POA: Diagnosis not present

## 2015-01-02 DIAGNOSIS — R1312 Dysphagia, oropharyngeal phase: Secondary | ICD-10-CM | POA: Diagnosis not present

## 2015-01-04 DIAGNOSIS — N390 Urinary tract infection, site not specified: Secondary | ICD-10-CM | POA: Diagnosis not present

## 2015-01-04 DIAGNOSIS — R269 Unspecified abnormalities of gait and mobility: Secondary | ICD-10-CM | POA: Diagnosis not present

## 2015-01-04 DIAGNOSIS — I10 Essential (primary) hypertension: Secondary | ICD-10-CM | POA: Diagnosis not present

## 2015-01-04 DIAGNOSIS — M6281 Muscle weakness (generalized): Secondary | ICD-10-CM | POA: Diagnosis not present

## 2015-01-04 DIAGNOSIS — B9620 Unspecified Escherichia coli [E. coli] as the cause of diseases classified elsewhere: Secondary | ICD-10-CM | POA: Diagnosis not present

## 2015-01-04 DIAGNOSIS — R1312 Dysphagia, oropharyngeal phase: Secondary | ICD-10-CM | POA: Diagnosis not present

## 2015-01-04 DIAGNOSIS — E785 Hyperlipidemia, unspecified: Secondary | ICD-10-CM | POA: Diagnosis not present

## 2015-01-04 DIAGNOSIS — K5900 Constipation, unspecified: Secondary | ICD-10-CM | POA: Diagnosis not present

## 2015-01-04 DIAGNOSIS — K219 Gastro-esophageal reflux disease without esophagitis: Secondary | ICD-10-CM | POA: Diagnosis not present

## 2015-01-04 DIAGNOSIS — F0390 Unspecified dementia without behavioral disturbance: Secondary | ICD-10-CM | POA: Diagnosis not present

## 2015-01-04 DIAGNOSIS — I4891 Unspecified atrial fibrillation: Secondary | ICD-10-CM | POA: Diagnosis not present

## 2015-01-04 DIAGNOSIS — H409 Unspecified glaucoma: Secondary | ICD-10-CM | POA: Diagnosis not present

## 2015-01-04 DIAGNOSIS — K3184 Gastroparesis: Secondary | ICD-10-CM | POA: Diagnosis not present

## 2015-01-04 DIAGNOSIS — E119 Type 2 diabetes mellitus without complications: Secondary | ICD-10-CM | POA: Diagnosis not present

## 2015-01-07 DIAGNOSIS — E785 Hyperlipidemia, unspecified: Secondary | ICD-10-CM | POA: Diagnosis not present

## 2015-01-07 DIAGNOSIS — N390 Urinary tract infection, site not specified: Secondary | ICD-10-CM | POA: Diagnosis not present

## 2015-01-07 DIAGNOSIS — I4891 Unspecified atrial fibrillation: Secondary | ICD-10-CM | POA: Diagnosis not present

## 2015-01-07 DIAGNOSIS — R1312 Dysphagia, oropharyngeal phase: Secondary | ICD-10-CM | POA: Diagnosis not present

## 2015-01-07 DIAGNOSIS — K3184 Gastroparesis: Secondary | ICD-10-CM | POA: Diagnosis not present

## 2015-01-07 DIAGNOSIS — E119 Type 2 diabetes mellitus without complications: Secondary | ICD-10-CM | POA: Diagnosis not present

## 2015-01-07 DIAGNOSIS — B9620 Unspecified Escherichia coli [E. coli] as the cause of diseases classified elsewhere: Secondary | ICD-10-CM | POA: Diagnosis not present

## 2015-01-07 DIAGNOSIS — I10 Essential (primary) hypertension: Secondary | ICD-10-CM | POA: Diagnosis not present

## 2015-01-07 DIAGNOSIS — H409 Unspecified glaucoma: Secondary | ICD-10-CM | POA: Diagnosis not present

## 2015-01-07 DIAGNOSIS — M6281 Muscle weakness (generalized): Secondary | ICD-10-CM | POA: Diagnosis not present

## 2015-01-07 DIAGNOSIS — K5900 Constipation, unspecified: Secondary | ICD-10-CM | POA: Diagnosis not present

## 2015-01-07 DIAGNOSIS — K219 Gastro-esophageal reflux disease without esophagitis: Secondary | ICD-10-CM | POA: Diagnosis not present

## 2015-01-07 DIAGNOSIS — F0390 Unspecified dementia without behavioral disturbance: Secondary | ICD-10-CM | POA: Diagnosis not present

## 2015-01-07 DIAGNOSIS — R269 Unspecified abnormalities of gait and mobility: Secondary | ICD-10-CM | POA: Diagnosis not present

## 2015-01-07 NOTE — Telephone Encounter (Signed)
Patients daughter called.  Stacey Brown is in a nursing home for rehab.

## 2015-01-08 DIAGNOSIS — N390 Urinary tract infection, site not specified: Secondary | ICD-10-CM | POA: Diagnosis not present

## 2015-01-08 DIAGNOSIS — E119 Type 2 diabetes mellitus without complications: Secondary | ICD-10-CM | POA: Diagnosis not present

## 2015-01-08 DIAGNOSIS — H409 Unspecified glaucoma: Secondary | ICD-10-CM | POA: Diagnosis not present

## 2015-01-08 DIAGNOSIS — R269 Unspecified abnormalities of gait and mobility: Secondary | ICD-10-CM | POA: Diagnosis not present

## 2015-01-08 DIAGNOSIS — K5900 Constipation, unspecified: Secondary | ICD-10-CM | POA: Diagnosis not present

## 2015-01-08 DIAGNOSIS — R1312 Dysphagia, oropharyngeal phase: Secondary | ICD-10-CM | POA: Diagnosis not present

## 2015-01-08 DIAGNOSIS — B9620 Unspecified Escherichia coli [E. coli] as the cause of diseases classified elsewhere: Secondary | ICD-10-CM | POA: Diagnosis not present

## 2015-01-08 DIAGNOSIS — I10 Essential (primary) hypertension: Secondary | ICD-10-CM | POA: Diagnosis not present

## 2015-01-08 DIAGNOSIS — F0390 Unspecified dementia without behavioral disturbance: Secondary | ICD-10-CM | POA: Diagnosis not present

## 2015-01-08 DIAGNOSIS — E785 Hyperlipidemia, unspecified: Secondary | ICD-10-CM | POA: Diagnosis not present

## 2015-01-08 DIAGNOSIS — M6281 Muscle weakness (generalized): Secondary | ICD-10-CM | POA: Diagnosis not present

## 2015-01-08 DIAGNOSIS — I4891 Unspecified atrial fibrillation: Secondary | ICD-10-CM | POA: Diagnosis not present

## 2015-01-08 DIAGNOSIS — K3184 Gastroparesis: Secondary | ICD-10-CM | POA: Diagnosis not present

## 2015-01-08 DIAGNOSIS — K219 Gastro-esophageal reflux disease without esophagitis: Secondary | ICD-10-CM | POA: Diagnosis not present

## 2015-01-09 DIAGNOSIS — B9620 Unspecified Escherichia coli [E. coli] as the cause of diseases classified elsewhere: Secondary | ICD-10-CM | POA: Diagnosis not present

## 2015-01-09 DIAGNOSIS — E119 Type 2 diabetes mellitus without complications: Secondary | ICD-10-CM | POA: Diagnosis not present

## 2015-01-09 DIAGNOSIS — I4891 Unspecified atrial fibrillation: Secondary | ICD-10-CM | POA: Diagnosis not present

## 2015-01-09 DIAGNOSIS — M6281 Muscle weakness (generalized): Secondary | ICD-10-CM | POA: Diagnosis not present

## 2015-01-09 DIAGNOSIS — I10 Essential (primary) hypertension: Secondary | ICD-10-CM | POA: Diagnosis not present

## 2015-01-09 DIAGNOSIS — N390 Urinary tract infection, site not specified: Secondary | ICD-10-CM | POA: Diagnosis not present

## 2015-01-09 DIAGNOSIS — R1312 Dysphagia, oropharyngeal phase: Secondary | ICD-10-CM | POA: Diagnosis not present

## 2015-01-09 DIAGNOSIS — H409 Unspecified glaucoma: Secondary | ICD-10-CM | POA: Diagnosis not present

## 2015-01-09 DIAGNOSIS — K3184 Gastroparesis: Secondary | ICD-10-CM | POA: Diagnosis not present

## 2015-01-09 DIAGNOSIS — E785 Hyperlipidemia, unspecified: Secondary | ICD-10-CM | POA: Diagnosis not present

## 2015-01-09 DIAGNOSIS — K219 Gastro-esophageal reflux disease without esophagitis: Secondary | ICD-10-CM | POA: Diagnosis not present

## 2015-01-09 DIAGNOSIS — R269 Unspecified abnormalities of gait and mobility: Secondary | ICD-10-CM | POA: Diagnosis not present

## 2015-01-09 DIAGNOSIS — K5900 Constipation, unspecified: Secondary | ICD-10-CM | POA: Diagnosis not present

## 2015-01-09 DIAGNOSIS — F0390 Unspecified dementia without behavioral disturbance: Secondary | ICD-10-CM | POA: Diagnosis not present

## 2015-01-10 DIAGNOSIS — F0390 Unspecified dementia without behavioral disturbance: Secondary | ICD-10-CM | POA: Diagnosis not present

## 2015-01-10 DIAGNOSIS — R2681 Unsteadiness on feet: Secondary | ICD-10-CM | POA: Diagnosis not present

## 2015-01-10 DIAGNOSIS — E785 Hyperlipidemia, unspecified: Secondary | ICD-10-CM | POA: Diagnosis not present

## 2015-01-10 DIAGNOSIS — I10 Essential (primary) hypertension: Secondary | ICD-10-CM | POA: Diagnosis not present

## 2015-01-10 DIAGNOSIS — I4891 Unspecified atrial fibrillation: Secondary | ICD-10-CM | POA: Diagnosis not present

## 2015-01-10 DIAGNOSIS — E119 Type 2 diabetes mellitus without complications: Secondary | ICD-10-CM | POA: Diagnosis not present

## 2015-01-10 DIAGNOSIS — K219 Gastro-esophageal reflux disease without esophagitis: Secondary | ICD-10-CM | POA: Diagnosis not present

## 2015-01-10 DIAGNOSIS — K3184 Gastroparesis: Secondary | ICD-10-CM | POA: Diagnosis not present

## 2015-01-10 DIAGNOSIS — H409 Unspecified glaucoma: Secondary | ICD-10-CM | POA: Diagnosis not present

## 2015-01-10 DIAGNOSIS — R1312 Dysphagia, oropharyngeal phase: Secondary | ICD-10-CM | POA: Diagnosis not present

## 2015-01-10 DIAGNOSIS — R531 Weakness: Secondary | ICD-10-CM | POA: Diagnosis not present

## 2015-01-10 DIAGNOSIS — K5900 Constipation, unspecified: Secondary | ICD-10-CM | POA: Diagnosis not present

## 2015-01-11 DIAGNOSIS — F0390 Unspecified dementia without behavioral disturbance: Secondary | ICD-10-CM | POA: Diagnosis not present

## 2015-01-11 DIAGNOSIS — H409 Unspecified glaucoma: Secondary | ICD-10-CM | POA: Diagnosis not present

## 2015-01-11 DIAGNOSIS — E119 Type 2 diabetes mellitus without complications: Secondary | ICD-10-CM | POA: Diagnosis not present

## 2015-01-11 DIAGNOSIS — I10 Essential (primary) hypertension: Secondary | ICD-10-CM | POA: Diagnosis not present

## 2015-01-11 DIAGNOSIS — R1312 Dysphagia, oropharyngeal phase: Secondary | ICD-10-CM | POA: Diagnosis not present

## 2015-01-11 DIAGNOSIS — K219 Gastro-esophageal reflux disease without esophagitis: Secondary | ICD-10-CM | POA: Diagnosis not present

## 2015-01-11 DIAGNOSIS — E785 Hyperlipidemia, unspecified: Secondary | ICD-10-CM | POA: Diagnosis not present

## 2015-01-11 DIAGNOSIS — K3184 Gastroparesis: Secondary | ICD-10-CM | POA: Diagnosis not present

## 2015-01-11 DIAGNOSIS — I4891 Unspecified atrial fibrillation: Secondary | ICD-10-CM | POA: Diagnosis not present

## 2015-01-11 DIAGNOSIS — K5900 Constipation, unspecified: Secondary | ICD-10-CM | POA: Diagnosis not present

## 2015-01-13 DIAGNOSIS — I10 Essential (primary) hypertension: Secondary | ICD-10-CM | POA: Diagnosis not present

## 2015-01-13 DIAGNOSIS — E785 Hyperlipidemia, unspecified: Secondary | ICD-10-CM | POA: Diagnosis not present

## 2015-01-13 DIAGNOSIS — H409 Unspecified glaucoma: Secondary | ICD-10-CM | POA: Diagnosis not present

## 2015-01-13 DIAGNOSIS — K5900 Constipation, unspecified: Secondary | ICD-10-CM | POA: Diagnosis not present

## 2015-01-13 DIAGNOSIS — K219 Gastro-esophageal reflux disease without esophagitis: Secondary | ICD-10-CM | POA: Diagnosis not present

## 2015-01-13 DIAGNOSIS — F0390 Unspecified dementia without behavioral disturbance: Secondary | ICD-10-CM | POA: Diagnosis not present

## 2015-01-13 DIAGNOSIS — I4891 Unspecified atrial fibrillation: Secondary | ICD-10-CM | POA: Diagnosis not present

## 2015-01-13 DIAGNOSIS — E119 Type 2 diabetes mellitus without complications: Secondary | ICD-10-CM | POA: Diagnosis not present

## 2015-01-13 DIAGNOSIS — R1312 Dysphagia, oropharyngeal phase: Secondary | ICD-10-CM | POA: Diagnosis not present

## 2015-01-13 DIAGNOSIS — K3184 Gastroparesis: Secondary | ICD-10-CM | POA: Diagnosis not present

## 2015-01-14 DIAGNOSIS — H409 Unspecified glaucoma: Secondary | ICD-10-CM | POA: Diagnosis not present

## 2015-01-14 DIAGNOSIS — I4891 Unspecified atrial fibrillation: Secondary | ICD-10-CM | POA: Diagnosis not present

## 2015-01-14 DIAGNOSIS — R1312 Dysphagia, oropharyngeal phase: Secondary | ICD-10-CM | POA: Diagnosis not present

## 2015-01-14 DIAGNOSIS — K219 Gastro-esophageal reflux disease without esophagitis: Secondary | ICD-10-CM | POA: Diagnosis not present

## 2015-01-14 DIAGNOSIS — K3184 Gastroparesis: Secondary | ICD-10-CM | POA: Diagnosis not present

## 2015-01-14 DIAGNOSIS — I10 Essential (primary) hypertension: Secondary | ICD-10-CM | POA: Diagnosis not present

## 2015-01-14 DIAGNOSIS — E785 Hyperlipidemia, unspecified: Secondary | ICD-10-CM | POA: Diagnosis not present

## 2015-01-14 DIAGNOSIS — E119 Type 2 diabetes mellitus without complications: Secondary | ICD-10-CM | POA: Diagnosis not present

## 2015-01-14 DIAGNOSIS — K5900 Constipation, unspecified: Secondary | ICD-10-CM | POA: Diagnosis not present

## 2015-01-14 DIAGNOSIS — F0390 Unspecified dementia without behavioral disturbance: Secondary | ICD-10-CM | POA: Diagnosis not present

## 2015-01-15 DIAGNOSIS — R1312 Dysphagia, oropharyngeal phase: Secondary | ICD-10-CM | POA: Diagnosis not present

## 2015-01-15 DIAGNOSIS — F0390 Unspecified dementia without behavioral disturbance: Secondary | ICD-10-CM | POA: Diagnosis not present

## 2015-01-15 DIAGNOSIS — K5900 Constipation, unspecified: Secondary | ICD-10-CM | POA: Diagnosis not present

## 2015-01-15 DIAGNOSIS — I10 Essential (primary) hypertension: Secondary | ICD-10-CM | POA: Diagnosis not present

## 2015-01-15 DIAGNOSIS — E785 Hyperlipidemia, unspecified: Secondary | ICD-10-CM | POA: Diagnosis not present

## 2015-01-15 DIAGNOSIS — I4891 Unspecified atrial fibrillation: Secondary | ICD-10-CM | POA: Diagnosis not present

## 2015-01-15 DIAGNOSIS — K219 Gastro-esophageal reflux disease without esophagitis: Secondary | ICD-10-CM | POA: Diagnosis not present

## 2015-01-15 DIAGNOSIS — K3184 Gastroparesis: Secondary | ICD-10-CM | POA: Diagnosis not present

## 2015-01-15 DIAGNOSIS — H409 Unspecified glaucoma: Secondary | ICD-10-CM | POA: Diagnosis not present

## 2015-01-15 DIAGNOSIS — E119 Type 2 diabetes mellitus without complications: Secondary | ICD-10-CM | POA: Diagnosis not present

## 2015-01-17 DIAGNOSIS — K219 Gastro-esophageal reflux disease without esophagitis: Secondary | ICD-10-CM | POA: Diagnosis not present

## 2015-01-17 DIAGNOSIS — R1312 Dysphagia, oropharyngeal phase: Secondary | ICD-10-CM | POA: Diagnosis not present

## 2015-01-17 DIAGNOSIS — E785 Hyperlipidemia, unspecified: Secondary | ICD-10-CM | POA: Diagnosis not present

## 2015-01-17 DIAGNOSIS — E119 Type 2 diabetes mellitus without complications: Secondary | ICD-10-CM | POA: Diagnosis not present

## 2015-01-17 DIAGNOSIS — H409 Unspecified glaucoma: Secondary | ICD-10-CM | POA: Diagnosis not present

## 2015-01-17 DIAGNOSIS — I4891 Unspecified atrial fibrillation: Secondary | ICD-10-CM | POA: Diagnosis not present

## 2015-01-17 DIAGNOSIS — K5900 Constipation, unspecified: Secondary | ICD-10-CM | POA: Diagnosis not present

## 2015-01-17 DIAGNOSIS — F0390 Unspecified dementia without behavioral disturbance: Secondary | ICD-10-CM | POA: Diagnosis not present

## 2015-01-17 DIAGNOSIS — K3184 Gastroparesis: Secondary | ICD-10-CM | POA: Diagnosis not present

## 2015-01-17 DIAGNOSIS — I10 Essential (primary) hypertension: Secondary | ICD-10-CM | POA: Diagnosis not present

## 2015-01-19 DIAGNOSIS — I10 Essential (primary) hypertension: Secondary | ICD-10-CM | POA: Diagnosis not present

## 2015-01-19 DIAGNOSIS — K5900 Constipation, unspecified: Secondary | ICD-10-CM | POA: Diagnosis not present

## 2015-01-19 DIAGNOSIS — H409 Unspecified glaucoma: Secondary | ICD-10-CM | POA: Diagnosis not present

## 2015-01-19 DIAGNOSIS — K3184 Gastroparesis: Secondary | ICD-10-CM | POA: Diagnosis not present

## 2015-01-19 DIAGNOSIS — F0390 Unspecified dementia without behavioral disturbance: Secondary | ICD-10-CM | POA: Diagnosis not present

## 2015-01-19 DIAGNOSIS — K219 Gastro-esophageal reflux disease without esophagitis: Secondary | ICD-10-CM | POA: Diagnosis not present

## 2015-01-19 DIAGNOSIS — E785 Hyperlipidemia, unspecified: Secondary | ICD-10-CM | POA: Diagnosis not present

## 2015-01-19 DIAGNOSIS — I4891 Unspecified atrial fibrillation: Secondary | ICD-10-CM | POA: Diagnosis not present

## 2015-01-19 DIAGNOSIS — E119 Type 2 diabetes mellitus without complications: Secondary | ICD-10-CM | POA: Diagnosis not present

## 2015-01-19 DIAGNOSIS — R1312 Dysphagia, oropharyngeal phase: Secondary | ICD-10-CM | POA: Diagnosis not present

## 2015-01-21 DIAGNOSIS — I4891 Unspecified atrial fibrillation: Secondary | ICD-10-CM | POA: Diagnosis not present

## 2015-01-21 DIAGNOSIS — K5900 Constipation, unspecified: Secondary | ICD-10-CM | POA: Diagnosis not present

## 2015-01-21 DIAGNOSIS — I10 Essential (primary) hypertension: Secondary | ICD-10-CM | POA: Diagnosis not present

## 2015-01-21 DIAGNOSIS — F0390 Unspecified dementia without behavioral disturbance: Secondary | ICD-10-CM | POA: Diagnosis not present

## 2015-01-21 DIAGNOSIS — K219 Gastro-esophageal reflux disease without esophagitis: Secondary | ICD-10-CM | POA: Diagnosis not present

## 2015-01-21 DIAGNOSIS — R1312 Dysphagia, oropharyngeal phase: Secondary | ICD-10-CM | POA: Diagnosis not present

## 2015-01-21 DIAGNOSIS — E785 Hyperlipidemia, unspecified: Secondary | ICD-10-CM | POA: Diagnosis not present

## 2015-01-21 DIAGNOSIS — K3184 Gastroparesis: Secondary | ICD-10-CM | POA: Diagnosis not present

## 2015-01-21 DIAGNOSIS — H409 Unspecified glaucoma: Secondary | ICD-10-CM | POA: Diagnosis not present

## 2015-01-21 DIAGNOSIS — E119 Type 2 diabetes mellitus without complications: Secondary | ICD-10-CM | POA: Diagnosis not present

## 2015-01-24 DIAGNOSIS — E1139 Type 2 diabetes mellitus with other diabetic ophthalmic complication: Secondary | ICD-10-CM | POA: Diagnosis not present

## 2015-01-24 DIAGNOSIS — I129 Hypertensive chronic kidney disease with stage 1 through stage 4 chronic kidney disease, or unspecified chronic kidney disease: Secondary | ICD-10-CM | POA: Diagnosis not present

## 2015-01-24 DIAGNOSIS — H409 Unspecified glaucoma: Secondary | ICD-10-CM | POA: Diagnosis not present

## 2015-01-24 DIAGNOSIS — N183 Chronic kidney disease, stage 3 (moderate): Secondary | ICD-10-CM | POA: Diagnosis not present

## 2015-01-24 DIAGNOSIS — K3184 Gastroparesis: Secondary | ICD-10-CM | POA: Diagnosis not present

## 2015-01-24 DIAGNOSIS — I4891 Unspecified atrial fibrillation: Secondary | ICD-10-CM | POA: Diagnosis not present

## 2015-01-24 DIAGNOSIS — F0390 Unspecified dementia without behavioral disturbance: Secondary | ICD-10-CM | POA: Diagnosis not present

## 2015-01-24 DIAGNOSIS — R1312 Dysphagia, oropharyngeal phase: Secondary | ICD-10-CM | POA: Diagnosis not present

## 2015-01-24 DIAGNOSIS — K219 Gastro-esophageal reflux disease without esophagitis: Secondary | ICD-10-CM | POA: Diagnosis not present

## 2015-01-24 DIAGNOSIS — E440 Moderate protein-calorie malnutrition: Secondary | ICD-10-CM | POA: Diagnosis not present

## 2015-01-24 DIAGNOSIS — L89613 Pressure ulcer of right heel, stage 3: Secondary | ICD-10-CM | POA: Diagnosis not present

## 2015-01-24 DIAGNOSIS — E1143 Type 2 diabetes mellitus with diabetic autonomic (poly)neuropathy: Secondary | ICD-10-CM | POA: Diagnosis not present

## 2015-01-24 DIAGNOSIS — E1122 Type 2 diabetes mellitus with diabetic chronic kidney disease: Secondary | ICD-10-CM | POA: Diagnosis not present

## 2015-01-28 DIAGNOSIS — E1122 Type 2 diabetes mellitus with diabetic chronic kidney disease: Secondary | ICD-10-CM | POA: Diagnosis not present

## 2015-01-28 DIAGNOSIS — E440 Moderate protein-calorie malnutrition: Secondary | ICD-10-CM | POA: Diagnosis not present

## 2015-01-28 DIAGNOSIS — R1312 Dysphagia, oropharyngeal phase: Secondary | ICD-10-CM | POA: Diagnosis not present

## 2015-01-28 DIAGNOSIS — E1143 Type 2 diabetes mellitus with diabetic autonomic (poly)neuropathy: Secondary | ICD-10-CM | POA: Diagnosis not present

## 2015-01-28 DIAGNOSIS — F0390 Unspecified dementia without behavioral disturbance: Secondary | ICD-10-CM | POA: Diagnosis not present

## 2015-01-28 DIAGNOSIS — I4891 Unspecified atrial fibrillation: Secondary | ICD-10-CM | POA: Diagnosis not present

## 2015-01-28 DIAGNOSIS — K219 Gastro-esophageal reflux disease without esophagitis: Secondary | ICD-10-CM | POA: Diagnosis not present

## 2015-01-28 DIAGNOSIS — I129 Hypertensive chronic kidney disease with stage 1 through stage 4 chronic kidney disease, or unspecified chronic kidney disease: Secondary | ICD-10-CM | POA: Diagnosis not present

## 2015-01-28 DIAGNOSIS — H409 Unspecified glaucoma: Secondary | ICD-10-CM | POA: Diagnosis not present

## 2015-01-28 DIAGNOSIS — E1139 Type 2 diabetes mellitus with other diabetic ophthalmic complication: Secondary | ICD-10-CM | POA: Diagnosis not present

## 2015-01-28 DIAGNOSIS — K3184 Gastroparesis: Secondary | ICD-10-CM | POA: Diagnosis not present

## 2015-01-28 DIAGNOSIS — L89613 Pressure ulcer of right heel, stage 3: Secondary | ICD-10-CM | POA: Diagnosis not present

## 2015-01-28 DIAGNOSIS — N183 Chronic kidney disease, stage 3 (moderate): Secondary | ICD-10-CM | POA: Diagnosis not present

## 2015-01-29 DIAGNOSIS — E1139 Type 2 diabetes mellitus with other diabetic ophthalmic complication: Secondary | ICD-10-CM | POA: Diagnosis not present

## 2015-01-29 DIAGNOSIS — K219 Gastro-esophageal reflux disease without esophagitis: Secondary | ICD-10-CM | POA: Diagnosis not present

## 2015-01-29 DIAGNOSIS — K3184 Gastroparesis: Secondary | ICD-10-CM | POA: Diagnosis not present

## 2015-01-29 DIAGNOSIS — E440 Moderate protein-calorie malnutrition: Secondary | ICD-10-CM | POA: Diagnosis not present

## 2015-01-29 DIAGNOSIS — E1143 Type 2 diabetes mellitus with diabetic autonomic (poly)neuropathy: Secondary | ICD-10-CM | POA: Diagnosis not present

## 2015-01-29 DIAGNOSIS — I4891 Unspecified atrial fibrillation: Secondary | ICD-10-CM | POA: Diagnosis not present

## 2015-01-29 DIAGNOSIS — F0390 Unspecified dementia without behavioral disturbance: Secondary | ICD-10-CM | POA: Diagnosis not present

## 2015-01-29 DIAGNOSIS — N183 Chronic kidney disease, stage 3 (moderate): Secondary | ICD-10-CM | POA: Diagnosis not present

## 2015-01-29 DIAGNOSIS — E1122 Type 2 diabetes mellitus with diabetic chronic kidney disease: Secondary | ICD-10-CM | POA: Diagnosis not present

## 2015-01-29 DIAGNOSIS — I129 Hypertensive chronic kidney disease with stage 1 through stage 4 chronic kidney disease, or unspecified chronic kidney disease: Secondary | ICD-10-CM | POA: Diagnosis not present

## 2015-01-29 DIAGNOSIS — L89613 Pressure ulcer of right heel, stage 3: Secondary | ICD-10-CM | POA: Diagnosis not present

## 2015-01-29 DIAGNOSIS — H409 Unspecified glaucoma: Secondary | ICD-10-CM | POA: Diagnosis not present

## 2015-01-29 DIAGNOSIS — R1312 Dysphagia, oropharyngeal phase: Secondary | ICD-10-CM | POA: Diagnosis not present

## 2015-01-30 ENCOUNTER — Ambulatory Visit (INDEPENDENT_AMBULATORY_CARE_PROVIDER_SITE_OTHER): Payer: Medicare Other | Admitting: Family

## 2015-01-30 ENCOUNTER — Encounter: Payer: Self-pay | Admitting: Family

## 2015-01-30 VITALS — BP 112/57 | HR 72 | Temp 97.3°F | Ht 63.0 in | Wt 104.0 lb

## 2015-01-30 DIAGNOSIS — E1139 Type 2 diabetes mellitus with other diabetic ophthalmic complication: Secondary | ICD-10-CM | POA: Diagnosis not present

## 2015-01-30 DIAGNOSIS — Z9181 History of falling: Secondary | ICD-10-CM | POA: Diagnosis not present

## 2015-01-30 DIAGNOSIS — Z09 Encounter for follow-up examination after completed treatment for conditions other than malignant neoplasm: Secondary | ICD-10-CM

## 2015-01-30 DIAGNOSIS — E1122 Type 2 diabetes mellitus with diabetic chronic kidney disease: Secondary | ICD-10-CM | POA: Diagnosis not present

## 2015-01-30 DIAGNOSIS — N183 Chronic kidney disease, stage 3 (moderate): Secondary | ICD-10-CM | POA: Diagnosis not present

## 2015-01-30 DIAGNOSIS — L89613 Pressure ulcer of right heel, stage 3: Secondary | ICD-10-CM | POA: Diagnosis not present

## 2015-01-30 DIAGNOSIS — R1312 Dysphagia, oropharyngeal phase: Secondary | ICD-10-CM | POA: Diagnosis not present

## 2015-01-30 DIAGNOSIS — K3184 Gastroparesis: Secondary | ICD-10-CM | POA: Diagnosis not present

## 2015-01-30 DIAGNOSIS — K219 Gastro-esophageal reflux disease without esophagitis: Secondary | ICD-10-CM | POA: Diagnosis not present

## 2015-01-30 DIAGNOSIS — I10 Essential (primary) hypertension: Secondary | ICD-10-CM | POA: Diagnosis not present

## 2015-01-30 DIAGNOSIS — E1143 Type 2 diabetes mellitus with diabetic autonomic (poly)neuropathy: Secondary | ICD-10-CM | POA: Diagnosis not present

## 2015-01-30 DIAGNOSIS — F0390 Unspecified dementia without behavioral disturbance: Secondary | ICD-10-CM | POA: Diagnosis not present

## 2015-01-30 DIAGNOSIS — I129 Hypertensive chronic kidney disease with stage 1 through stage 4 chronic kidney disease, or unspecified chronic kidney disease: Secondary | ICD-10-CM | POA: Diagnosis not present

## 2015-01-30 DIAGNOSIS — I4891 Unspecified atrial fibrillation: Secondary | ICD-10-CM | POA: Diagnosis not present

## 2015-01-30 DIAGNOSIS — E440 Moderate protein-calorie malnutrition: Secondary | ICD-10-CM | POA: Diagnosis not present

## 2015-01-30 DIAGNOSIS — H409 Unspecified glaucoma: Secondary | ICD-10-CM | POA: Diagnosis not present

## 2015-01-30 NOTE — Progress Notes (Signed)
   Subjective:    Patient ID: Stacey Brown, female    DOB: 28-Dec-1922, 81 y.o.   MRN: [id number]  HPI Pt presents to the office today for follow up SNF discharge on 01/22/15. Pt's grandson and wife are present during visit. Pt states she went to [facility] in Sept 2016 for weakness and SOB. Pt was then discharged and sent to [place] home the first of October for physical therapy. Pt states she is doing well and feels "stronger", but is still weak. Pt states she continues to have SOB with any exertion such as walking, bathing, or dressing.  Pt states she lives with her daughter in a trailer. Pt states there is a ramp that she uses a wheelchair to get in and out of the home.    Review of Systems  Constitutional: Positive for fatigue.  HENT: Positive for mouth sores.   Eyes: Negative.   Respiratory: Positive for shortness of breath.   Cardiovascular: Negative.  Negative for palpitations.  Gastrointestinal: Negative.   Endocrine: Negative.   Genitourinary: Negative.   Musculoskeletal: Negative.   Neurological: Negative.  Negative for headaches.  Hematological: Negative.   Psychiatric/Behavioral: Negative.   All other systems reviewed and are negative.      Objective:   Physical Exam  Constitutional: She is oriented to person, place, and time. She appears well-developed. No distress.  Thin frail   HENT:  Head: Normocephalic and atraumatic.  Eyes: Pupils are equal, round, and reactive to light.  Neck: Normal range of motion. Neck supple. No thyromegaly present.  Cardiovascular: Normal rate and intact distal pulses.   Murmur heard. Irregular rhythm   Pulmonary/Chest: Effort normal and breath sounds normal. No respiratory distress. She has no wheezes.  Abdominal: Soft. Bowel sounds are normal. She exhibits no distension. There is no tenderness.  Musculoskeletal: Normal range of motion. She exhibits no edema or tenderness.  Neurological: She is alert and oriented to person,  place, and time. She has normal reflexes. No cranial nerve deficit.  Skin: Skin is warm and dry.  Psychiatric: She has a normal mood and affect. Her behavior is normal. Judgment and thought content normal.  Vitals reviewed.   BP 112/57 mmHg  Pulse 72  Temp(Src) 97.3 F (36.3 C) (Oral)  Ht _0  (1.6 m)  Wt 104 lb (47.174 kg)  BMI 18.43 kg/m2       Assessment & Plan:  1. Hospital discharge follow-up - BMP8+EGFR -Continue medications -Falls precaution discussed -RTO 3 months  2. Essential hypertension, benign -lasix d/c today- No fluid present and BP WNL - BMP8+EGFR  Evelina Dun, FNP

## 2015-01-30 NOTE — Patient Instructions (Addendum)

## 2015-01-31 DIAGNOSIS — H409 Unspecified glaucoma: Secondary | ICD-10-CM | POA: Diagnosis not present

## 2015-01-31 DIAGNOSIS — R1312 Dysphagia, oropharyngeal phase: Secondary | ICD-10-CM | POA: Diagnosis not present

## 2015-01-31 DIAGNOSIS — K3184 Gastroparesis: Secondary | ICD-10-CM | POA: Diagnosis not present

## 2015-01-31 DIAGNOSIS — N183 Chronic kidney disease, stage 3 (moderate): Secondary | ICD-10-CM | POA: Diagnosis not present

## 2015-01-31 DIAGNOSIS — I4891 Unspecified atrial fibrillation: Secondary | ICD-10-CM | POA: Diagnosis not present

## 2015-01-31 DIAGNOSIS — E1139 Type 2 diabetes mellitus with other diabetic ophthalmic complication: Secondary | ICD-10-CM | POA: Diagnosis not present

## 2015-01-31 DIAGNOSIS — E440 Moderate protein-calorie malnutrition: Secondary | ICD-10-CM | POA: Diagnosis not present

## 2015-01-31 DIAGNOSIS — E1143 Type 2 diabetes mellitus with diabetic autonomic (poly)neuropathy: Secondary | ICD-10-CM | POA: Diagnosis not present

## 2015-01-31 DIAGNOSIS — L89613 Pressure ulcer of right heel, stage 3: Secondary | ICD-10-CM | POA: Diagnosis not present

## 2015-01-31 DIAGNOSIS — F0390 Unspecified dementia without behavioral disturbance: Secondary | ICD-10-CM | POA: Diagnosis not present

## 2015-01-31 DIAGNOSIS — K219 Gastro-esophageal reflux disease without esophagitis: Secondary | ICD-10-CM | POA: Diagnosis not present

## 2015-01-31 DIAGNOSIS — E1122 Type 2 diabetes mellitus with diabetic chronic kidney disease: Secondary | ICD-10-CM | POA: Diagnosis not present

## 2015-01-31 DIAGNOSIS — I129 Hypertensive chronic kidney disease with stage 1 through stage 4 chronic kidney disease, or unspecified chronic kidney disease: Secondary | ICD-10-CM | POA: Diagnosis not present

## 2015-01-31 LAB — BMP8+EGFR
BUN / CREAT RATIO: 19 (ref 11–26)
BUN: 19 mg/dL (ref 10–36)
CALCIUM: 9.3 mg/dL (ref 8.7–10.3)
CO2: 27 mmol/L (ref 18–29)
Chloride: 96 mmol/L (ref 96–106)
Creatinine, Ser: 1.01 mg/dL — ABNORMAL HIGH (ref 0.57–1.00)
GFR, EST AFRICAN AMERICAN: 57 mL/min/{1.73_m2} — AB (ref >59)
GFR, EST NON AFRICAN AMERICAN: 49 mL/min/{1.73_m2} — AB (ref >59)
Glucose: 194 mg/dL — ABNORMAL HIGH (ref 65–99)
Potassium: 3.8 mmol/L (ref 3.5–5.2)
Sodium: 141 mmol/L (ref 134–144)

## 2015-02-01 DIAGNOSIS — F0390 Unspecified dementia without behavioral disturbance: Secondary | ICD-10-CM | POA: Diagnosis not present

## 2015-02-01 DIAGNOSIS — E1139 Type 2 diabetes mellitus with other diabetic ophthalmic complication: Secondary | ICD-10-CM | POA: Diagnosis not present

## 2015-02-01 DIAGNOSIS — E1143 Type 2 diabetes mellitus with diabetic autonomic (poly)neuropathy: Secondary | ICD-10-CM | POA: Diagnosis not present

## 2015-02-01 DIAGNOSIS — K3184 Gastroparesis: Secondary | ICD-10-CM | POA: Diagnosis not present

## 2015-02-01 DIAGNOSIS — I129 Hypertensive chronic kidney disease with stage 1 through stage 4 chronic kidney disease, or unspecified chronic kidney disease: Secondary | ICD-10-CM | POA: Diagnosis not present

## 2015-02-01 DIAGNOSIS — E1122 Type 2 diabetes mellitus with diabetic chronic kidney disease: Secondary | ICD-10-CM | POA: Diagnosis not present

## 2015-02-01 DIAGNOSIS — I4891 Unspecified atrial fibrillation: Secondary | ICD-10-CM | POA: Diagnosis not present

## 2015-02-01 DIAGNOSIS — H409 Unspecified glaucoma: Secondary | ICD-10-CM | POA: Diagnosis not present

## 2015-02-01 DIAGNOSIS — K219 Gastro-esophageal reflux disease without esophagitis: Secondary | ICD-10-CM | POA: Diagnosis not present

## 2015-02-01 DIAGNOSIS — R1312 Dysphagia, oropharyngeal phase: Secondary | ICD-10-CM | POA: Diagnosis not present

## 2015-02-01 DIAGNOSIS — L89613 Pressure ulcer of right heel, stage 3: Secondary | ICD-10-CM | POA: Diagnosis not present

## 2015-02-01 DIAGNOSIS — N183 Chronic kidney disease, stage 3 (moderate): Secondary | ICD-10-CM | POA: Diagnosis not present

## 2015-02-01 DIAGNOSIS — E440 Moderate protein-calorie malnutrition: Secondary | ICD-10-CM | POA: Diagnosis not present

## 2015-02-05 DIAGNOSIS — N183 Chronic kidney disease, stage 3 (moderate): Secondary | ICD-10-CM | POA: Diagnosis not present

## 2015-02-05 DIAGNOSIS — E1139 Type 2 diabetes mellitus with other diabetic ophthalmic complication: Secondary | ICD-10-CM | POA: Diagnosis not present

## 2015-02-05 DIAGNOSIS — K3184 Gastroparesis: Secondary | ICD-10-CM | POA: Diagnosis not present

## 2015-02-05 DIAGNOSIS — I4891 Unspecified atrial fibrillation: Secondary | ICD-10-CM | POA: Diagnosis not present

## 2015-02-05 DIAGNOSIS — R1312 Dysphagia, oropharyngeal phase: Secondary | ICD-10-CM | POA: Diagnosis not present

## 2015-02-05 DIAGNOSIS — I129 Hypertensive chronic kidney disease with stage 1 through stage 4 chronic kidney disease, or unspecified chronic kidney disease: Secondary | ICD-10-CM | POA: Diagnosis not present

## 2015-02-05 DIAGNOSIS — F0390 Unspecified dementia without behavioral disturbance: Secondary | ICD-10-CM | POA: Diagnosis not present

## 2015-02-05 DIAGNOSIS — E1143 Type 2 diabetes mellitus with diabetic autonomic (poly)neuropathy: Secondary | ICD-10-CM | POA: Diagnosis not present

## 2015-02-05 DIAGNOSIS — E440 Moderate protein-calorie malnutrition: Secondary | ICD-10-CM | POA: Diagnosis not present

## 2015-02-05 DIAGNOSIS — L89613 Pressure ulcer of right heel, stage 3: Secondary | ICD-10-CM | POA: Diagnosis not present

## 2015-02-05 DIAGNOSIS — H409 Unspecified glaucoma: Secondary | ICD-10-CM | POA: Diagnosis not present

## 2015-02-05 DIAGNOSIS — E1122 Type 2 diabetes mellitus with diabetic chronic kidney disease: Secondary | ICD-10-CM | POA: Diagnosis not present

## 2015-02-05 DIAGNOSIS — K219 Gastro-esophageal reflux disease without esophagitis: Secondary | ICD-10-CM | POA: Diagnosis not present

## 2015-02-06 DIAGNOSIS — K219 Gastro-esophageal reflux disease without esophagitis: Secondary | ICD-10-CM | POA: Diagnosis not present

## 2015-02-06 DIAGNOSIS — I129 Hypertensive chronic kidney disease with stage 1 through stage 4 chronic kidney disease, or unspecified chronic kidney disease: Secondary | ICD-10-CM | POA: Diagnosis not present

## 2015-02-06 DIAGNOSIS — E1122 Type 2 diabetes mellitus with diabetic chronic kidney disease: Secondary | ICD-10-CM | POA: Diagnosis not present

## 2015-02-06 DIAGNOSIS — E1143 Type 2 diabetes mellitus with diabetic autonomic (poly)neuropathy: Secondary | ICD-10-CM | POA: Diagnosis not present

## 2015-02-06 DIAGNOSIS — H409 Unspecified glaucoma: Secondary | ICD-10-CM | POA: Diagnosis not present

## 2015-02-06 DIAGNOSIS — E440 Moderate protein-calorie malnutrition: Secondary | ICD-10-CM | POA: Diagnosis not present

## 2015-02-06 DIAGNOSIS — R1312 Dysphagia, oropharyngeal phase: Secondary | ICD-10-CM | POA: Diagnosis not present

## 2015-02-06 DIAGNOSIS — I4891 Unspecified atrial fibrillation: Secondary | ICD-10-CM | POA: Diagnosis not present

## 2015-02-06 DIAGNOSIS — F0390 Unspecified dementia without behavioral disturbance: Secondary | ICD-10-CM | POA: Diagnosis not present

## 2015-02-06 DIAGNOSIS — K3184 Gastroparesis: Secondary | ICD-10-CM | POA: Diagnosis not present

## 2015-02-06 DIAGNOSIS — E1139 Type 2 diabetes mellitus with other diabetic ophthalmic complication: Secondary | ICD-10-CM | POA: Diagnosis not present

## 2015-02-06 DIAGNOSIS — N183 Chronic kidney disease, stage 3 (moderate): Secondary | ICD-10-CM | POA: Diagnosis not present

## 2015-02-06 DIAGNOSIS — L89613 Pressure ulcer of right heel, stage 3: Secondary | ICD-10-CM | POA: Diagnosis not present

## 2015-02-07 DIAGNOSIS — E440 Moderate protein-calorie malnutrition: Secondary | ICD-10-CM | POA: Diagnosis not present

## 2015-02-07 DIAGNOSIS — K219 Gastro-esophageal reflux disease without esophagitis: Secondary | ICD-10-CM | POA: Diagnosis not present

## 2015-02-07 DIAGNOSIS — F0390 Unspecified dementia without behavioral disturbance: Secondary | ICD-10-CM | POA: Diagnosis not present

## 2015-02-07 DIAGNOSIS — R1312 Dysphagia, oropharyngeal phase: Secondary | ICD-10-CM | POA: Diagnosis not present

## 2015-02-07 DIAGNOSIS — E1122 Type 2 diabetes mellitus with diabetic chronic kidney disease: Secondary | ICD-10-CM | POA: Diagnosis not present

## 2015-02-07 DIAGNOSIS — K3184 Gastroparesis: Secondary | ICD-10-CM | POA: Diagnosis not present

## 2015-02-07 DIAGNOSIS — E1143 Type 2 diabetes mellitus with diabetic autonomic (poly)neuropathy: Secondary | ICD-10-CM | POA: Diagnosis not present

## 2015-02-07 DIAGNOSIS — E1139 Type 2 diabetes mellitus with other diabetic ophthalmic complication: Secondary | ICD-10-CM | POA: Diagnosis not present

## 2015-02-07 DIAGNOSIS — I129 Hypertensive chronic kidney disease with stage 1 through stage 4 chronic kidney disease, or unspecified chronic kidney disease: Secondary | ICD-10-CM | POA: Diagnosis not present

## 2015-02-07 DIAGNOSIS — N183 Chronic kidney disease, stage 3 (moderate): Secondary | ICD-10-CM | POA: Diagnosis not present

## 2015-02-07 DIAGNOSIS — H409 Unspecified glaucoma: Secondary | ICD-10-CM | POA: Diagnosis not present

## 2015-02-07 DIAGNOSIS — L89613 Pressure ulcer of right heel, stage 3: Secondary | ICD-10-CM | POA: Diagnosis not present

## 2015-02-07 DIAGNOSIS — I4891 Unspecified atrial fibrillation: Secondary | ICD-10-CM | POA: Diagnosis not present

## 2015-02-08 DIAGNOSIS — N183 Chronic kidney disease, stage 3 (moderate): Secondary | ICD-10-CM | POA: Diagnosis not present

## 2015-02-08 DIAGNOSIS — I129 Hypertensive chronic kidney disease with stage 1 through stage 4 chronic kidney disease, or unspecified chronic kidney disease: Secondary | ICD-10-CM | POA: Diagnosis not present

## 2015-02-08 DIAGNOSIS — F0390 Unspecified dementia without behavioral disturbance: Secondary | ICD-10-CM | POA: Diagnosis not present

## 2015-02-08 DIAGNOSIS — K3184 Gastroparesis: Secondary | ICD-10-CM | POA: Diagnosis not present

## 2015-02-08 DIAGNOSIS — L89613 Pressure ulcer of right heel, stage 3: Secondary | ICD-10-CM | POA: Diagnosis not present

## 2015-02-08 DIAGNOSIS — E1143 Type 2 diabetes mellitus with diabetic autonomic (poly)neuropathy: Secondary | ICD-10-CM | POA: Diagnosis not present

## 2015-02-08 DIAGNOSIS — K219 Gastro-esophageal reflux disease without esophagitis: Secondary | ICD-10-CM | POA: Diagnosis not present

## 2015-02-08 DIAGNOSIS — E440 Moderate protein-calorie malnutrition: Secondary | ICD-10-CM | POA: Diagnosis not present

## 2015-02-08 DIAGNOSIS — H409 Unspecified glaucoma: Secondary | ICD-10-CM | POA: Diagnosis not present

## 2015-02-08 DIAGNOSIS — E1122 Type 2 diabetes mellitus with diabetic chronic kidney disease: Secondary | ICD-10-CM | POA: Diagnosis not present

## 2015-02-08 DIAGNOSIS — I4891 Unspecified atrial fibrillation: Secondary | ICD-10-CM | POA: Diagnosis not present

## 2015-02-08 DIAGNOSIS — E1139 Type 2 diabetes mellitus with other diabetic ophthalmic complication: Secondary | ICD-10-CM | POA: Diagnosis not present

## 2015-02-08 DIAGNOSIS — R1312 Dysphagia, oropharyngeal phase: Secondary | ICD-10-CM | POA: Diagnosis not present

## 2015-02-08 DIAGNOSIS — M6281 Muscle weakness (generalized): Secondary | ICD-10-CM | POA: Diagnosis not present

## 2015-02-12 DIAGNOSIS — E1122 Type 2 diabetes mellitus with diabetic chronic kidney disease: Secondary | ICD-10-CM | POA: Diagnosis not present

## 2015-02-12 DIAGNOSIS — K3184 Gastroparesis: Secondary | ICD-10-CM | POA: Diagnosis not present

## 2015-02-12 DIAGNOSIS — E1139 Type 2 diabetes mellitus with other diabetic ophthalmic complication: Secondary | ICD-10-CM | POA: Diagnosis not present

## 2015-02-12 DIAGNOSIS — E440 Moderate protein-calorie malnutrition: Secondary | ICD-10-CM | POA: Diagnosis not present

## 2015-02-12 DIAGNOSIS — N183 Chronic kidney disease, stage 3 (moderate): Secondary | ICD-10-CM | POA: Diagnosis not present

## 2015-02-12 DIAGNOSIS — K219 Gastro-esophageal reflux disease without esophagitis: Secondary | ICD-10-CM | POA: Diagnosis not present

## 2015-02-12 DIAGNOSIS — E1143 Type 2 diabetes mellitus with diabetic autonomic (poly)neuropathy: Secondary | ICD-10-CM | POA: Diagnosis not present

## 2015-02-12 DIAGNOSIS — I4891 Unspecified atrial fibrillation: Secondary | ICD-10-CM | POA: Diagnosis not present

## 2015-02-12 DIAGNOSIS — H409 Unspecified glaucoma: Secondary | ICD-10-CM | POA: Diagnosis not present

## 2015-02-12 DIAGNOSIS — L89613 Pressure ulcer of right heel, stage 3: Secondary | ICD-10-CM | POA: Diagnosis not present

## 2015-02-12 DIAGNOSIS — I129 Hypertensive chronic kidney disease with stage 1 through stage 4 chronic kidney disease, or unspecified chronic kidney disease: Secondary | ICD-10-CM | POA: Diagnosis not present

## 2015-02-12 DIAGNOSIS — R1312 Dysphagia, oropharyngeal phase: Secondary | ICD-10-CM | POA: Diagnosis not present

## 2015-02-14 DIAGNOSIS — K3184 Gastroparesis: Secondary | ICD-10-CM | POA: Diagnosis not present

## 2015-02-14 DIAGNOSIS — E1122 Type 2 diabetes mellitus with diabetic chronic kidney disease: Secondary | ICD-10-CM | POA: Diagnosis not present

## 2015-02-14 DIAGNOSIS — L89613 Pressure ulcer of right heel, stage 3: Secondary | ICD-10-CM | POA: Diagnosis not present

## 2015-02-14 DIAGNOSIS — N183 Chronic kidney disease, stage 3 (moderate): Secondary | ICD-10-CM | POA: Diagnosis not present

## 2015-02-14 DIAGNOSIS — E1139 Type 2 diabetes mellitus with other diabetic ophthalmic complication: Secondary | ICD-10-CM | POA: Diagnosis not present

## 2015-02-14 DIAGNOSIS — K219 Gastro-esophageal reflux disease without esophagitis: Secondary | ICD-10-CM | POA: Diagnosis not present

## 2015-02-14 DIAGNOSIS — I129 Hypertensive chronic kidney disease with stage 1 through stage 4 chronic kidney disease, or unspecified chronic kidney disease: Secondary | ICD-10-CM | POA: Diagnosis not present

## 2015-02-14 DIAGNOSIS — H409 Unspecified glaucoma: Secondary | ICD-10-CM | POA: Diagnosis not present

## 2015-02-14 DIAGNOSIS — E440 Moderate protein-calorie malnutrition: Secondary | ICD-10-CM | POA: Diagnosis not present

## 2015-02-14 DIAGNOSIS — I4891 Unspecified atrial fibrillation: Secondary | ICD-10-CM | POA: Diagnosis not present

## 2015-02-14 DIAGNOSIS — R1312 Dysphagia, oropharyngeal phase: Secondary | ICD-10-CM | POA: Diagnosis not present

## 2015-02-14 DIAGNOSIS — E1143 Type 2 diabetes mellitus with diabetic autonomic (poly)neuropathy: Secondary | ICD-10-CM | POA: Diagnosis not present

## 2015-02-19 DIAGNOSIS — I129 Hypertensive chronic kidney disease with stage 1 through stage 4 chronic kidney disease, or unspecified chronic kidney disease: Secondary | ICD-10-CM | POA: Diagnosis not present

## 2015-02-19 DIAGNOSIS — E1122 Type 2 diabetes mellitus with diabetic chronic kidney disease: Secondary | ICD-10-CM | POA: Diagnosis not present

## 2015-02-19 DIAGNOSIS — N183 Chronic kidney disease, stage 3 (moderate): Secondary | ICD-10-CM | POA: Diagnosis not present

## 2015-02-19 DIAGNOSIS — L89613 Pressure ulcer of right heel, stage 3: Secondary | ICD-10-CM | POA: Diagnosis not present

## 2015-02-20 DIAGNOSIS — E1122 Type 2 diabetes mellitus with diabetic chronic kidney disease: Secondary | ICD-10-CM | POA: Diagnosis not present

## 2015-02-20 DIAGNOSIS — L89613 Pressure ulcer of right heel, stage 3: Secondary | ICD-10-CM | POA: Diagnosis not present

## 2015-02-20 DIAGNOSIS — I129 Hypertensive chronic kidney disease with stage 1 through stage 4 chronic kidney disease, or unspecified chronic kidney disease: Secondary | ICD-10-CM | POA: Diagnosis not present

## 2015-02-20 DIAGNOSIS — N183 Chronic kidney disease, stage 3 (moderate): Secondary | ICD-10-CM | POA: Diagnosis not present

## 2015-02-22 ENCOUNTER — Other Ambulatory Visit: Payer: Self-pay | Admitting: Family

## 2015-02-22 DIAGNOSIS — I129 Hypertensive chronic kidney disease with stage 1 through stage 4 chronic kidney disease, or unspecified chronic kidney disease: Secondary | ICD-10-CM | POA: Diagnosis not present

## 2015-02-22 DIAGNOSIS — E1122 Type 2 diabetes mellitus with diabetic chronic kidney disease: Secondary | ICD-10-CM | POA: Diagnosis not present

## 2015-02-22 DIAGNOSIS — I10 Essential (primary) hypertension: Secondary | ICD-10-CM

## 2015-02-22 DIAGNOSIS — L89613 Pressure ulcer of right heel, stage 3: Secondary | ICD-10-CM | POA: Diagnosis not present

## 2015-02-22 DIAGNOSIS — N183 Chronic kidney disease, stage 3 (moderate): Secondary | ICD-10-CM | POA: Diagnosis not present

## 2015-02-22 MED ORDER — NEBIVOLOL HCL 5 MG PO TABS: 5.0000 mg | ORAL_TABLET | Freq: Every day | ORAL | Status: DC

## 2015-02-22 MED ORDER — GLIMEPIRIDE 2 MG PO TABS: ORAL_TABLET | ORAL | Status: DC

## 2015-02-22 MED ORDER — LINACLOTIDE 290 MCG PO CAPS: 290.0000 ug | ORAL_CAPSULE | Freq: Every day | ORAL | Status: DC

## 2015-02-22 MED ORDER — APIXABAN 2.5 MG PO TABS: 2.5000 mg | ORAL_TABLET | Freq: Two times a day (BID) | ORAL | Status: DC

## 2015-02-22 NOTE — Telephone Encounter (Signed)
done

## 2015-02-24 DIAGNOSIS — I129 Hypertensive chronic kidney disease with stage 1 through stage 4 chronic kidney disease, or unspecified chronic kidney disease: Secondary | ICD-10-CM | POA: Diagnosis not present

## 2015-02-24 DIAGNOSIS — E1122 Type 2 diabetes mellitus with diabetic chronic kidney disease: Secondary | ICD-10-CM | POA: Diagnosis not present

## 2015-02-24 DIAGNOSIS — N183 Chronic kidney disease, stage 3 (moderate): Secondary | ICD-10-CM | POA: Diagnosis not present

## 2015-02-24 DIAGNOSIS — L89613 Pressure ulcer of right heel, stage 3: Secondary | ICD-10-CM | POA: Diagnosis not present

## 2015-02-26 DIAGNOSIS — I129 Hypertensive chronic kidney disease with stage 1 through stage 4 chronic kidney disease, or unspecified chronic kidney disease: Secondary | ICD-10-CM | POA: Diagnosis not present

## 2015-02-26 DIAGNOSIS — N183 Chronic kidney disease, stage 3 (moderate): Secondary | ICD-10-CM | POA: Diagnosis not present

## 2015-02-26 DIAGNOSIS — E1122 Type 2 diabetes mellitus with diabetic chronic kidney disease: Secondary | ICD-10-CM | POA: Diagnosis not present

## 2015-02-26 DIAGNOSIS — L89613 Pressure ulcer of right heel, stage 3: Secondary | ICD-10-CM | POA: Diagnosis not present

## 2015-03-01 DIAGNOSIS — I129 Hypertensive chronic kidney disease with stage 1 through stage 4 chronic kidney disease, or unspecified chronic kidney disease: Secondary | ICD-10-CM | POA: Diagnosis not present

## 2015-03-01 DIAGNOSIS — L89613 Pressure ulcer of right heel, stage 3: Secondary | ICD-10-CM | POA: Diagnosis not present

## 2015-03-01 DIAGNOSIS — N183 Chronic kidney disease, stage 3 (moderate): Secondary | ICD-10-CM | POA: Diagnosis not present

## 2015-03-01 DIAGNOSIS — E1122 Type 2 diabetes mellitus with diabetic chronic kidney disease: Secondary | ICD-10-CM | POA: Diagnosis not present

## 2015-03-04 DIAGNOSIS — I129 Hypertensive chronic kidney disease with stage 1 through stage 4 chronic kidney disease, or unspecified chronic kidney disease: Secondary | ICD-10-CM | POA: Diagnosis not present

## 2015-03-04 DIAGNOSIS — N183 Chronic kidney disease, stage 3 (moderate): Secondary | ICD-10-CM | POA: Diagnosis not present

## 2015-03-04 DIAGNOSIS — E1122 Type 2 diabetes mellitus with diabetic chronic kidney disease: Secondary | ICD-10-CM | POA: Diagnosis not present

## 2015-03-04 DIAGNOSIS — L89613 Pressure ulcer of right heel, stage 3: Secondary | ICD-10-CM | POA: Diagnosis not present

## 2015-03-05 DIAGNOSIS — I129 Hypertensive chronic kidney disease with stage 1 through stage 4 chronic kidney disease, or unspecified chronic kidney disease: Secondary | ICD-10-CM | POA: Diagnosis not present

## 2015-03-05 DIAGNOSIS — E1122 Type 2 diabetes mellitus with diabetic chronic kidney disease: Secondary | ICD-10-CM | POA: Diagnosis not present

## 2015-03-05 DIAGNOSIS — N183 Chronic kidney disease, stage 3 (moderate): Secondary | ICD-10-CM | POA: Diagnosis not present

## 2015-03-05 DIAGNOSIS — L89613 Pressure ulcer of right heel, stage 3: Secondary | ICD-10-CM | POA: Diagnosis not present

## 2015-03-06 DIAGNOSIS — I129 Hypertensive chronic kidney disease with stage 1 through stage 4 chronic kidney disease, or unspecified chronic kidney disease: Secondary | ICD-10-CM | POA: Diagnosis not present

## 2015-03-06 DIAGNOSIS — E1122 Type 2 diabetes mellitus with diabetic chronic kidney disease: Secondary | ICD-10-CM | POA: Diagnosis not present

## 2015-03-06 DIAGNOSIS — L89613 Pressure ulcer of right heel, stage 3: Secondary | ICD-10-CM | POA: Diagnosis not present

## 2015-03-06 DIAGNOSIS — N183 Chronic kidney disease, stage 3 (moderate): Secondary | ICD-10-CM | POA: Diagnosis not present

## 2015-03-07 DIAGNOSIS — N183 Chronic kidney disease, stage 3 (moderate): Secondary | ICD-10-CM | POA: Diagnosis not present

## 2015-03-07 DIAGNOSIS — I129 Hypertensive chronic kidney disease with stage 1 through stage 4 chronic kidney disease, or unspecified chronic kidney disease: Secondary | ICD-10-CM | POA: Diagnosis not present

## 2015-03-07 DIAGNOSIS — E1122 Type 2 diabetes mellitus with diabetic chronic kidney disease: Secondary | ICD-10-CM | POA: Diagnosis not present

## 2015-03-07 DIAGNOSIS — L89613 Pressure ulcer of right heel, stage 3: Secondary | ICD-10-CM | POA: Diagnosis not present

## 2015-03-08 DIAGNOSIS — E1122 Type 2 diabetes mellitus with diabetic chronic kidney disease: Secondary | ICD-10-CM | POA: Diagnosis not present

## 2015-03-08 DIAGNOSIS — L89613 Pressure ulcer of right heel, stage 3: Secondary | ICD-10-CM | POA: Diagnosis not present

## 2015-03-08 DIAGNOSIS — I129 Hypertensive chronic kidney disease with stage 1 through stage 4 chronic kidney disease, or unspecified chronic kidney disease: Secondary | ICD-10-CM | POA: Diagnosis not present

## 2015-03-08 DIAGNOSIS — N183 Chronic kidney disease, stage 3 (moderate): Secondary | ICD-10-CM | POA: Diagnosis not present

## 2015-03-12 ENCOUNTER — Ambulatory Visit (INDEPENDENT_AMBULATORY_CARE_PROVIDER_SITE_OTHER): Payer: Medicare Other | Admitting: Family Medicine

## 2015-03-12 DIAGNOSIS — N183 Chronic kidney disease, stage 3 (moderate): Secondary | ICD-10-CM | POA: Diagnosis not present

## 2015-03-12 DIAGNOSIS — L89613 Pressure ulcer of right heel, stage 3: Secondary | ICD-10-CM | POA: Diagnosis not present

## 2015-03-12 DIAGNOSIS — I129 Hypertensive chronic kidney disease with stage 1 through stage 4 chronic kidney disease, or unspecified chronic kidney disease: Secondary | ICD-10-CM

## 2015-03-12 DIAGNOSIS — E1139 Type 2 diabetes mellitus with other diabetic ophthalmic complication: Secondary | ICD-10-CM | POA: Diagnosis not present

## 2015-03-12 DIAGNOSIS — F0390 Unspecified dementia without behavioral disturbance: Secondary | ICD-10-CM | POA: Diagnosis not present

## 2015-03-12 DIAGNOSIS — E1122 Type 2 diabetes mellitus with diabetic chronic kidney disease: Secondary | ICD-10-CM

## 2015-03-14 DIAGNOSIS — I129 Hypertensive chronic kidney disease with stage 1 through stage 4 chronic kidney disease, or unspecified chronic kidney disease: Secondary | ICD-10-CM | POA: Diagnosis not present

## 2015-03-14 DIAGNOSIS — E1122 Type 2 diabetes mellitus with diabetic chronic kidney disease: Secondary | ICD-10-CM | POA: Diagnosis not present

## 2015-03-14 DIAGNOSIS — L89613 Pressure ulcer of right heel, stage 3: Secondary | ICD-10-CM | POA: Diagnosis not present

## 2015-03-14 DIAGNOSIS — N183 Chronic kidney disease, stage 3 (moderate): Secondary | ICD-10-CM | POA: Diagnosis not present

## 2015-03-15 DIAGNOSIS — E1122 Type 2 diabetes mellitus with diabetic chronic kidney disease: Secondary | ICD-10-CM | POA: Diagnosis not present

## 2015-03-15 DIAGNOSIS — L89613 Pressure ulcer of right heel, stage 3: Secondary | ICD-10-CM | POA: Diagnosis not present

## 2015-03-15 DIAGNOSIS — I129 Hypertensive chronic kidney disease with stage 1 through stage 4 chronic kidney disease, or unspecified chronic kidney disease: Secondary | ICD-10-CM | POA: Diagnosis not present

## 2015-03-15 DIAGNOSIS — N183 Chronic kidney disease, stage 3 (moderate): Secondary | ICD-10-CM | POA: Diagnosis not present

## 2015-03-18 DIAGNOSIS — N183 Chronic kidney disease, stage 3 (moderate): Secondary | ICD-10-CM | POA: Diagnosis not present

## 2015-03-18 DIAGNOSIS — E1122 Type 2 diabetes mellitus with diabetic chronic kidney disease: Secondary | ICD-10-CM | POA: Diagnosis not present

## 2015-03-18 DIAGNOSIS — L89613 Pressure ulcer of right heel, stage 3: Secondary | ICD-10-CM | POA: Diagnosis not present

## 2015-03-18 DIAGNOSIS — I129 Hypertensive chronic kidney disease with stage 1 through stage 4 chronic kidney disease, or unspecified chronic kidney disease: Secondary | ICD-10-CM | POA: Diagnosis not present

## 2015-03-19 ENCOUNTER — Telehealth: Payer: Self-pay

## 2015-03-19 DIAGNOSIS — N183 Chronic kidney disease, stage 3 (moderate): Secondary | ICD-10-CM | POA: Diagnosis not present

## 2015-03-19 DIAGNOSIS — E1122 Type 2 diabetes mellitus with diabetic chronic kidney disease: Secondary | ICD-10-CM | POA: Diagnosis not present

## 2015-03-19 DIAGNOSIS — L89613 Pressure ulcer of right heel, stage 3: Secondary | ICD-10-CM | POA: Diagnosis not present

## 2015-03-19 DIAGNOSIS — I129 Hypertensive chronic kidney disease with stage 1 through stage 4 chronic kidney disease, or unspecified chronic kidney disease: Secondary | ICD-10-CM | POA: Diagnosis not present

## 2015-03-19 NOTE — Telephone Encounter (Signed)
Physical therapist calling to report that patient had a fall and states that she is OK other than sore arms

## 2015-03-20 DIAGNOSIS — N183 Chronic kidney disease, stage 3 (moderate): Secondary | ICD-10-CM | POA: Diagnosis not present

## 2015-03-20 DIAGNOSIS — L89613 Pressure ulcer of right heel, stage 3: Secondary | ICD-10-CM | POA: Diagnosis not present

## 2015-03-20 DIAGNOSIS — E1122 Type 2 diabetes mellitus with diabetic chronic kidney disease: Secondary | ICD-10-CM | POA: Diagnosis not present

## 2015-03-20 DIAGNOSIS — I129 Hypertensive chronic kidney disease with stage 1 through stage 4 chronic kidney disease, or unspecified chronic kidney disease: Secondary | ICD-10-CM | POA: Diagnosis not present

## 2015-03-21 DIAGNOSIS — N183 Chronic kidney disease, stage 3 (moderate): Secondary | ICD-10-CM | POA: Diagnosis not present

## 2015-03-21 DIAGNOSIS — E1122 Type 2 diabetes mellitus with diabetic chronic kidney disease: Secondary | ICD-10-CM | POA: Diagnosis not present

## 2015-03-21 DIAGNOSIS — I129 Hypertensive chronic kidney disease with stage 1 through stage 4 chronic kidney disease, or unspecified chronic kidney disease: Secondary | ICD-10-CM | POA: Diagnosis not present

## 2015-03-21 DIAGNOSIS — L89613 Pressure ulcer of right heel, stage 3: Secondary | ICD-10-CM | POA: Diagnosis not present

## 2015-03-22 ENCOUNTER — Telehealth: Payer: Self-pay | Admitting: Family

## 2015-03-22 DIAGNOSIS — E1122 Type 2 diabetes mellitus with diabetic chronic kidney disease: Secondary | ICD-10-CM | POA: Diagnosis not present

## 2015-03-22 DIAGNOSIS — L89613 Pressure ulcer of right heel, stage 3: Secondary | ICD-10-CM | POA: Diagnosis not present

## 2015-03-22 DIAGNOSIS — I129 Hypertensive chronic kidney disease with stage 1 through stage 4 chronic kidney disease, or unspecified chronic kidney disease: Secondary | ICD-10-CM | POA: Diagnosis not present

## 2015-03-22 DIAGNOSIS — N183 Chronic kidney disease, stage 3 (moderate): Secondary | ICD-10-CM | POA: Diagnosis not present

## 2015-03-23 ENCOUNTER — Ambulatory Visit (INDEPENDENT_AMBULATORY_CARE_PROVIDER_SITE_OTHER): Payer: Medicare Other | Admitting: Family

## 2015-03-23 ENCOUNTER — Encounter: Payer: Self-pay | Admitting: Family

## 2015-03-23 VITALS — BP 178/68 | HR 70 | Temp 97.0°F

## 2015-03-23 DIAGNOSIS — J309 Allergic rhinitis, unspecified: Secondary | ICD-10-CM | POA: Diagnosis not present

## 2015-03-23 DIAGNOSIS — R630 Anorexia: Secondary | ICD-10-CM | POA: Diagnosis not present

## 2015-03-23 DIAGNOSIS — J069 Acute upper respiratory infection, unspecified: Secondary | ICD-10-CM

## 2015-03-23 MED ORDER — MIRTAZAPINE 7.5 MG PO TABS: 7.5000 mg | ORAL_TABLET | Freq: Every day | ORAL | Status: DC

## 2015-03-23 MED ORDER — FLUTICASONE PROPIONATE 50 MCG/ACT NA SUSP: 2.0000 | Freq: Every day | NASAL | Status: DC

## 2015-03-23 NOTE — Patient Instructions (Signed)
Upper Respiratory Infection, Adult Most upper respiratory infections (URIs) are a viral infection of the air passages leading to the lungs. A URI affects the nose, throat, and upper air passages. The most common type of URI is nasopharyngitis and is typically referred to as "the common cold." URIs run their course and usually go away on their own. Most of the time, a URI does not require medical attention, but sometimes a bacterial infection in the upper airways can follow a viral infection. This is called a secondary infection. Sinus and middle ear infections are common types of secondary upper respiratory infections. Bacterial pneumonia can also complicate a URI. A URI can worsen asthma and chronic obstructive pulmonary disease (COPD). Sometimes, these complications can require emergency medical care and may be life threatening.  CAUSES Almost all URIs are caused by viruses. A virus is a type of germ and can spread from one person to another.  RISKS FACTORS You may be at risk for a URI if:   You smoke.   You have chronic heart or lung disease.  You have a weakened defense (immune) system.   You are very young or very old.   You have nasal allergies or asthma.  You work in crowded or poorly ventilated areas.  You work in health care facilities or schools. SIGNS AND SYMPTOMS  Symptoms typically develop 2-3 days after you come in contact with a cold virus. Most viral URIs last 7-10 days. However, viral URIs from the influenza virus (flu virus) can last 14-18 days and are typically more severe. Symptoms may include:   Runny or stuffy (congested) nose.   Sneezing.   Cough.   Sore throat.   Headache.   Fatigue.   Fever.   Loss of appetite.   Pain in your forehead, behind your eyes, and over your cheekbones (sinus pain).  Muscle aches.  DIAGNOSIS  Your health care provider may diagnose a URI by:  Physical exam.  Tests to check that your symptoms are not due to  another condition such as:  Strep throat.  Sinusitis.  Pneumonia.  Asthma. TREATMENT  A URI goes away on its own with time. It cannot be cured with medicines, but medicines may be prescribed or recommended to relieve symptoms. Medicines may help:  Reduce your fever.  Reduce your cough.  Relieve nasal congestion. HOME CARE INSTRUCTIONS   Take medicines only as directed by your health care provider.   Gargle warm saltwater or take cough drops to comfort your throat as directed by your health care provider.  Use a warm mist humidifier or inhale steam from a shower to increase air moisture. This may make it easier to breathe.  Drink enough fluid to keep your urine clear or pale yellow.   Eat soups and other clear broths and maintain good nutrition.   Rest as needed.   Return to work when your temperature has returned to normal or as your health care provider advises. You may need to stay home longer to avoid infecting others. You can also use a face mask and careful hand washing to prevent spread of the virus.  Increase the usage of your inhaler if you have asthma.   Do not use any tobacco products, including cigarettes, chewing tobacco, or electronic cigarettes. If you need help quitting, ask your health care provider. PREVENTION  The best way to protect yourself from getting a cold is to practice good hygiene.   Avoid oral or hand contact with people with cold   symptoms.   Wash your hands often if contact occurs.  There is no clear evidence that vitamin C, vitamin E, echinacea, or exercise reduces the chance of developing a cold. However, it is always recommended to get plenty of rest, exercise, and practice good nutrition.  SEEK MEDICAL CARE IF:   You are getting worse rather than better.   Your symptoms are not controlled by medicine.   You have chills.  You have worsening shortness of breath.  You have brown or red mucus.  You have yellow or brown nasal  discharge.  You have pain in your face, especially when you bend forward.  You have a fever.  You have swollen neck glands.  You have pain while swallowing.  You have white areas in the back of your throat. SEEK IMMEDIATE MEDICAL CARE IF:   You have severe or persistent:  Headache.  Ear pain.  Sinus pain.  Chest pain.  You have chronic lung disease and any of the following:  Wheezing.  Prolonged cough.  Coughing up blood.  A change in your usual mucus.  You have a stiff neck.  You have changes in your:  Vision.  Hearing.  Thinking.  Mood. MAKE SURE YOU:   Understand these instructions.  Will watch your condition.  Will get help right away if you are not doing well or get worse.   This information is not intended to replace advice given to you by your health care provider. Make sure you discuss any questions you have with your health care provider.   Document Released: 07/22/2000 Document Revised: 06/12/2014 Document Reviewed: 05/03/2013 Elsevier Interactive Patient Education 2016 [employer]  - Take meds as prescribed - Use a cool mist humidifier  -Use saline nose sprays frequently -Saline irrigations of the nose can be very helpful if done frequently.  * 4X daily for 1 week*  * Use of a nettie pot can be helpful with this. Follow directions with this* -Force fluids -For any cough or congestion  Use plain Mucinex- regular strength or max strength is fine   * Children- consult with Pharmacist for dosing -For fever or aces or pains- take tylenol or ibuprofen appropriate for age and weight.  * for fevers greater than 101 orally you may alternate ibuprofen and tylenol every  3 hours. -Throat lozenges if help   Bernisha Verma, FNP   

## 2015-03-23 NOTE — Progress Notes (Signed)
Subjective:    Patient ID: Stacey Brown, female    DOB: 05/02/1922, 82 y.o.   MRN: [id number]  Cough This is a new problem. The current episode started in the past 7 days. The problem has been unchanged. The problem occurs constantly. The cough is non-productive ("dry"). Associated symptoms include chills, nasal congestion, rhinorrhea and shortness of breath. Pertinent negatives include no ear congestion, ear pain, headaches, myalgias, postnasal drip, sore throat or wheezing. The symptoms are aggravated by lying down. She has tried rest and OTC cough suppressant for the symptoms. The treatment provided mild relief. There is no history of asthma or COPD.      Review of Systems  Constitutional: Positive for chills.  HENT: Positive for rhinorrhea. Negative for ear pain, postnasal drip and sore throat.   Eyes: Negative.   Respiratory: Positive for cough and shortness of breath. Negative for wheezing.   Cardiovascular: Negative.  Negative for palpitations.  Gastrointestinal: Negative.   Endocrine: Negative.   Genitourinary: Negative.   Musculoskeletal: Negative.  Negative for myalgias.  Neurological: Negative.  Negative for headaches.  Hematological: Negative.   Psychiatric/Behavioral: Negative.   All other systems reviewed and are negative.      Objective:   Physical Exam  Constitutional: She is oriented to person, place, and time. She appears well-developed and well-nourished. No distress.  HENT:  Head: Normocephalic and atraumatic.  Right Ear: External ear normal.  Left Ear: External ear normal.  Nasal passage erythemas with moderate swelling Oropharynx erythema   Eyes: Pupils are equal, round, and reactive to light.  Neck: Normal range of motion. Neck supple. No thyromegaly present.  Cardiovascular: Normal rate, regular rhythm and intact distal pulses.   Murmur heard. Pulmonary/Chest: Effort normal. No respiratory distress. She has no wheezes.  Diminished   Abdominal:  Soft. Bowel sounds are normal. She exhibits no distension. There is no tenderness.  Musculoskeletal: Normal range of motion. She exhibits no edema or tenderness.  Neurological: She is alert and oriented to person, place, and time. She has normal reflexes. No cranial nerve deficit.  Skin: Skin is warm and dry.  Psychiatric: She has a normal mood and affect. Her behavior is normal. Judgment and thought content normal.  Vitals reviewed.     BP 178/68 mmHg  Pulse 70  Temp(Src) 97 F (36.1 C) (Oral)  Ht   Wt      Assessment & Plan:  1. Allergic rhinitis, unspecified allergic rhinitis type -Avoid allergens when possible - fluticasone (FLONASE) 50 MCG/ACT nasal spray; Place 2 sprays into both nostrils daily.  Dispense: 16 g; Refill: 6  2. Appetite loss - mirtazapine (REMERON) 7.5 MG tablet; Take 1 tablet (7.5 mg total) by mouth at bedtime.  Dispense: 90 tablet; Refill: 1  3. Acute upper respiratory infection -- Take meds as prescribed - Use a cool mist humidifier  -Use saline nose sprays frequently -Saline irrigations of the nose can be very helpful if done frequently.  * 4X daily for 1 week*  * Use of a nettie pot can be helpful with this. Follow directions with this* -Force fluids -For any cough or congestion  Use plain Mucinex- regular strength or max strength is fine   * Children- consult with Pharmacist for dosing -For fever or aces or pains- take tylenol or ibuprofen appropriate for age and weight.  * for fevers greater than 101 orally you may alternate ibuprofen and tylenol every  3 hours. -Throat lozenges if help -New toothbrush in 3 days -  fluticasone (FLONASE) 50 MCG/ACT nasal spray; Place 2 sprays into both nostrils daily.  Dispense: 16 g; Refill: [place], FNP

## 2015-03-25 NOTE — Telephone Encounter (Signed)
Done 03/23/15

## 2015-03-28 DIAGNOSIS — E1122 Type 2 diabetes mellitus with diabetic chronic kidney disease: Secondary | ICD-10-CM | POA: Diagnosis not present

## 2015-03-28 DIAGNOSIS — I129 Hypertensive chronic kidney disease with stage 1 through stage 4 chronic kidney disease, or unspecified chronic kidney disease: Secondary | ICD-10-CM | POA: Diagnosis not present

## 2015-03-28 DIAGNOSIS — N183 Chronic kidney disease, stage 3 (moderate): Secondary | ICD-10-CM | POA: Diagnosis not present

## 2015-03-28 DIAGNOSIS — L89613 Pressure ulcer of right heel, stage 3: Secondary | ICD-10-CM | POA: Diagnosis not present

## 2015-03-29 ENCOUNTER — Telehealth: Payer: Self-pay | Admitting: Family Medicine

## 2015-03-29 DIAGNOSIS — N183 Chronic kidney disease, stage 3 (moderate): Secondary | ICD-10-CM | POA: Diagnosis not present

## 2015-03-29 DIAGNOSIS — E1122 Type 2 diabetes mellitus with diabetic chronic kidney disease: Secondary | ICD-10-CM | POA: Diagnosis not present

## 2015-03-29 DIAGNOSIS — L89613 Pressure ulcer of right heel, stage 3: Secondary | ICD-10-CM | POA: Diagnosis not present

## 2015-03-29 DIAGNOSIS — I129 Hypertensive chronic kidney disease with stage 1 through stage 4 chronic kidney disease, or unspecified chronic kidney disease: Secondary | ICD-10-CM | POA: Diagnosis not present

## 2015-03-29 NOTE — Telephone Encounter (Signed)
Call given to home health °

## 2015-04-01 DIAGNOSIS — N183 Chronic kidney disease, stage 3 (moderate): Secondary | ICD-10-CM | POA: Diagnosis not present

## 2015-04-01 DIAGNOSIS — E1122 Type 2 diabetes mellitus with diabetic chronic kidney disease: Secondary | ICD-10-CM | POA: Diagnosis not present

## 2015-04-01 DIAGNOSIS — I129 Hypertensive chronic kidney disease with stage 1 through stage 4 chronic kidney disease, or unspecified chronic kidney disease: Secondary | ICD-10-CM | POA: Diagnosis not present

## 2015-04-01 DIAGNOSIS — L89613 Pressure ulcer of right heel, stage 3: Secondary | ICD-10-CM | POA: Diagnosis not present

## 2015-04-02 DIAGNOSIS — L89613 Pressure ulcer of right heel, stage 3: Secondary | ICD-10-CM | POA: Diagnosis not present

## 2015-04-02 DIAGNOSIS — N183 Chronic kidney disease, stage 3 (moderate): Secondary | ICD-10-CM | POA: Diagnosis not present

## 2015-04-02 DIAGNOSIS — I129 Hypertensive chronic kidney disease with stage 1 through stage 4 chronic kidney disease, or unspecified chronic kidney disease: Secondary | ICD-10-CM | POA: Diagnosis not present

## 2015-04-02 DIAGNOSIS — E1122 Type 2 diabetes mellitus with diabetic chronic kidney disease: Secondary | ICD-10-CM | POA: Diagnosis not present

## 2015-04-03 DIAGNOSIS — N183 Chronic kidney disease, stage 3 (moderate): Secondary | ICD-10-CM | POA: Diagnosis not present

## 2015-04-03 DIAGNOSIS — I129 Hypertensive chronic kidney disease with stage 1 through stage 4 chronic kidney disease, or unspecified chronic kidney disease: Secondary | ICD-10-CM | POA: Diagnosis not present

## 2015-04-03 DIAGNOSIS — E1122 Type 2 diabetes mellitus with diabetic chronic kidney disease: Secondary | ICD-10-CM | POA: Diagnosis not present

## 2015-04-03 DIAGNOSIS — L89613 Pressure ulcer of right heel, stage 3: Secondary | ICD-10-CM | POA: Diagnosis not present

## 2015-04-05 DIAGNOSIS — E1122 Type 2 diabetes mellitus with diabetic chronic kidney disease: Secondary | ICD-10-CM | POA: Diagnosis not present

## 2015-04-05 DIAGNOSIS — N183 Chronic kidney disease, stage 3 (moderate): Secondary | ICD-10-CM | POA: Diagnosis not present

## 2015-04-05 DIAGNOSIS — L89613 Pressure ulcer of right heel, stage 3: Secondary | ICD-10-CM | POA: Diagnosis not present

## 2015-04-05 DIAGNOSIS — I129 Hypertensive chronic kidney disease with stage 1 through stage 4 chronic kidney disease, or unspecified chronic kidney disease: Secondary | ICD-10-CM | POA: Diagnosis not present

## 2015-04-10 DIAGNOSIS — E1122 Type 2 diabetes mellitus with diabetic chronic kidney disease: Secondary | ICD-10-CM | POA: Diagnosis not present

## 2015-04-10 DIAGNOSIS — I129 Hypertensive chronic kidney disease with stage 1 through stage 4 chronic kidney disease, or unspecified chronic kidney disease: Secondary | ICD-10-CM | POA: Diagnosis not present

## 2015-04-10 DIAGNOSIS — L89613 Pressure ulcer of right heel, stage 3: Secondary | ICD-10-CM | POA: Diagnosis not present

## 2015-04-10 DIAGNOSIS — N183 Chronic kidney disease, stage 3 (moderate): Secondary | ICD-10-CM | POA: Diagnosis not present

## 2015-04-11 DIAGNOSIS — Z0289 Encounter for other administrative examinations: Secondary | ICD-10-CM

## 2015-04-13 DIAGNOSIS — E1122 Type 2 diabetes mellitus with diabetic chronic kidney disease: Secondary | ICD-10-CM | POA: Diagnosis not present

## 2015-04-13 DIAGNOSIS — L89613 Pressure ulcer of right heel, stage 3: Secondary | ICD-10-CM | POA: Diagnosis not present

## 2015-04-13 DIAGNOSIS — I129 Hypertensive chronic kidney disease with stage 1 through stage 4 chronic kidney disease, or unspecified chronic kidney disease: Secondary | ICD-10-CM | POA: Diagnosis not present

## 2015-04-13 DIAGNOSIS — N183 Chronic kidney disease, stage 3 (moderate): Secondary | ICD-10-CM | POA: Diagnosis not present

## 2015-04-17 DIAGNOSIS — I129 Hypertensive chronic kidney disease with stage 1 through stage 4 chronic kidney disease, or unspecified chronic kidney disease: Secondary | ICD-10-CM | POA: Diagnosis not present

## 2015-04-17 DIAGNOSIS — E1122 Type 2 diabetes mellitus with diabetic chronic kidney disease: Secondary | ICD-10-CM | POA: Diagnosis not present

## 2015-04-17 DIAGNOSIS — L89613 Pressure ulcer of right heel, stage 3: Secondary | ICD-10-CM | POA: Diagnosis not present

## 2015-04-17 DIAGNOSIS — N183 Chronic kidney disease, stage 3 (moderate): Secondary | ICD-10-CM | POA: Diagnosis not present

## 2015-04-19 DIAGNOSIS — E440 Moderate protein-calorie malnutrition: Secondary | ICD-10-CM | POA: Diagnosis not present

## 2015-04-19 DIAGNOSIS — K3184 Gastroparesis: Secondary | ICD-10-CM | POA: Diagnosis not present

## 2015-04-19 DIAGNOSIS — Z86718 Personal history of other venous thrombosis and embolism: Secondary | ICD-10-CM | POA: Diagnosis not present

## 2015-04-19 DIAGNOSIS — Z7901 Long term (current) use of anticoagulants: Secondary | ICD-10-CM | POA: Diagnosis not present

## 2015-04-19 DIAGNOSIS — E1122 Type 2 diabetes mellitus with diabetic chronic kidney disease: Secondary | ICD-10-CM | POA: Diagnosis not present

## 2015-04-19 DIAGNOSIS — E1143 Type 2 diabetes mellitus with diabetic autonomic (poly)neuropathy: Secondary | ICD-10-CM | POA: Diagnosis not present

## 2015-04-19 DIAGNOSIS — I129 Hypertensive chronic kidney disease with stage 1 through stage 4 chronic kidney disease, or unspecified chronic kidney disease: Secondary | ICD-10-CM | POA: Diagnosis not present

## 2015-04-19 DIAGNOSIS — I4891 Unspecified atrial fibrillation: Secondary | ICD-10-CM | POA: Diagnosis not present

## 2015-04-19 DIAGNOSIS — N183 Chronic kidney disease, stage 3 (moderate): Secondary | ICD-10-CM | POA: Diagnosis not present

## 2015-04-19 DIAGNOSIS — Z4800 Encounter for change or removal of nonsurgical wound dressing: Secondary | ICD-10-CM | POA: Diagnosis not present

## 2015-04-19 DIAGNOSIS — L89613 Pressure ulcer of right heel, stage 3: Secondary | ICD-10-CM | POA: Diagnosis not present

## 2015-04-19 DIAGNOSIS — Z87440 Personal history of urinary (tract) infections: Secondary | ICD-10-CM | POA: Diagnosis not present

## 2015-04-24 DIAGNOSIS — E1143 Type 2 diabetes mellitus with diabetic autonomic (poly)neuropathy: Secondary | ICD-10-CM | POA: Diagnosis not present

## 2015-04-24 DIAGNOSIS — E440 Moderate protein-calorie malnutrition: Secondary | ICD-10-CM | POA: Diagnosis not present

## 2015-04-24 DIAGNOSIS — Z7901 Long term (current) use of anticoagulants: Secondary | ICD-10-CM | POA: Diagnosis not present

## 2015-04-24 DIAGNOSIS — Z4800 Encounter for change or removal of nonsurgical wound dressing: Secondary | ICD-10-CM | POA: Diagnosis not present

## 2015-04-24 DIAGNOSIS — N183 Chronic kidney disease, stage 3 (moderate): Secondary | ICD-10-CM | POA: Diagnosis not present

## 2015-04-24 DIAGNOSIS — L89613 Pressure ulcer of right heel, stage 3: Secondary | ICD-10-CM | POA: Diagnosis not present

## 2015-04-24 DIAGNOSIS — Z87440 Personal history of urinary (tract) infections: Secondary | ICD-10-CM | POA: Diagnosis not present

## 2015-04-24 DIAGNOSIS — I4891 Unspecified atrial fibrillation: Secondary | ICD-10-CM | POA: Diagnosis not present

## 2015-04-24 DIAGNOSIS — K3184 Gastroparesis: Secondary | ICD-10-CM | POA: Diagnosis not present

## 2015-04-24 DIAGNOSIS — E1122 Type 2 diabetes mellitus with diabetic chronic kidney disease: Secondary | ICD-10-CM | POA: Diagnosis not present

## 2015-04-24 DIAGNOSIS — I129 Hypertensive chronic kidney disease with stage 1 through stage 4 chronic kidney disease, or unspecified chronic kidney disease: Secondary | ICD-10-CM | POA: Diagnosis not present

## 2015-04-24 DIAGNOSIS — Z86718 Personal history of other venous thrombosis and embolism: Secondary | ICD-10-CM | POA: Diagnosis not present

## 2015-04-29 ENCOUNTER — Other Ambulatory Visit: Payer: Self-pay | Admitting: Family Medicine

## 2015-04-30 ENCOUNTER — Ambulatory Visit (INDEPENDENT_AMBULATORY_CARE_PROVIDER_SITE_OTHER): Payer: Medicare Other | Admitting: Family

## 2015-04-30 ENCOUNTER — Encounter: Payer: Self-pay | Admitting: Family

## 2015-04-30 VITALS — BP 192/71 | HR 60 | Temp 97.0°F | Wt 104.0 lb

## 2015-04-30 DIAGNOSIS — H9193 Unspecified hearing loss, bilateral: Secondary | ICD-10-CM | POA: Diagnosis not present

## 2015-04-30 DIAGNOSIS — I10 Essential (primary) hypertension: Secondary | ICD-10-CM | POA: Diagnosis not present

## 2015-04-30 DIAGNOSIS — E119 Type 2 diabetes mellitus without complications: Secondary | ICD-10-CM | POA: Diagnosis not present

## 2015-04-30 DIAGNOSIS — N183 Chronic kidney disease, stage 3 unspecified: Secondary | ICD-10-CM

## 2015-04-30 DIAGNOSIS — R296 Repeated falls: Secondary | ICD-10-CM | POA: Diagnosis not present

## 2015-04-30 DIAGNOSIS — M1990 Unspecified osteoarthritis, unspecified site: Secondary | ICD-10-CM | POA: Diagnosis not present

## 2015-04-30 DIAGNOSIS — E559 Vitamin D deficiency, unspecified: Secondary | ICD-10-CM | POA: Diagnosis not present

## 2015-04-30 DIAGNOSIS — Z86718 Personal history of other venous thrombosis and embolism: Secondary | ICD-10-CM

## 2015-04-30 DIAGNOSIS — I4891 Unspecified atrial fibrillation: Secondary | ICD-10-CM

## 2015-04-30 DIAGNOSIS — M810 Age-related osteoporosis without current pathological fracture: Secondary | ICD-10-CM

## 2015-04-30 DIAGNOSIS — E785 Hyperlipidemia, unspecified: Secondary | ICD-10-CM | POA: Diagnosis not present

## 2015-04-30 DIAGNOSIS — Z9181 History of falling: Secondary | ICD-10-CM

## 2015-04-30 DIAGNOSIS — K5901 Slow transit constipation: Secondary | ICD-10-CM | POA: Diagnosis not present

## 2015-04-30 LAB — BAYER DCA HB A1C WAIVED: HB A1C (BAYER DCA - WAIVED): 6.3 % (ref <7.0)

## 2015-04-30 MED ORDER — LOSARTAN POTASSIUM 50 MG PO TABS: 50.0000 mg | ORAL_TABLET | Freq: Every day | ORAL | Status: DC

## 2015-04-30 NOTE — Patient Instructions (Signed)
Hypertension Hypertension, commonly called high blood pressure, is when the force of blood pumping through your arteries is too strong. Your arteries are the blood vessels that carry blood from your heart throughout your body. A blood pressure reading consists of a higher number over a lower number, such as 110/72. The higher number (systolic) is the pressure inside your arteries when your heart pumps. The lower number (diastolic) is the pressure inside your arteries when your heart relaxes. Ideally you want your blood pressure below 120/80. Hypertension forces your heart to work harder to pump blood. Your arteries may become narrow or stiff. Having untreated or uncontrolled hypertension can cause heart attack, stroke, kidney disease, and other problems. RISK FACTORS Some risk factors for high blood pressure are controllable. Others are not.  Risk factors you cannot control include:   Race. You may be at higher risk if you are African American.  Age. Risk increases with age.  Gender. Men are at higher risk than women before age 45 years. After age 65, women are at higher risk than men. Risk factors you can control include:  Not getting enough exercise or physical activity.  Being overweight.  Getting too much fat, sugar, calories, or salt in your diet.  Drinking too much alcohol. SIGNS AND SYMPTOMS Hypertension does not usually cause signs or symptoms. Extremely high blood pressure (hypertensive crisis) may cause headache, anxiety, shortness of breath, and nosebleed. DIAGNOSIS To check if you have hypertension, your health care provider will measure your blood pressure while you are seated, with your arm held at the level of your heart. It should be measured at least twice using the same arm. Certain conditions can cause a difference in blood pressure between your right and left arms. A blood pressure reading that is higher than normal on one occasion does not mean that you need treatment. If  it is not clear whether you have high blood pressure, you may be asked to return on a different day to have your blood pressure checked again. Or, you may be asked to monitor your blood pressure at home for 1 or more weeks. TREATMENT Treating high blood pressure includes making lifestyle changes and possibly taking medicine. Living a healthy lifestyle can help lower high blood pressure. You may need to change some of your habits. Lifestyle changes may include:  Following the DASH diet. This diet is high in fruits, vegetables, and whole grains. It is low in salt, red meat, and added sugars.  Keep your sodium intake below 2,300 mg per day.  Getting at least 30-45 minutes of aerobic exercise at least 4 times per week.  Losing weight if necessary.  Not smoking.  Limiting alcoholic beverages.  Learning ways to reduce stress. Your health care provider may prescribe medicine if lifestyle changes are not enough to get your blood pressure under control, and if one of the following is true:  You are 18-59 years of age and your systolic blood pressure is above 140.  You are 60 years of age or older, and your systolic blood pressure is above 150.  Your diastolic blood pressure is above 90.  You have diabetes, and your systolic blood pressure is over 140 or your diastolic blood pressure is over 90.  You have kidney disease and your blood pressure is above 140/90.  You have heart disease and your blood pressure is above 140/90. Your personal target blood pressure may vary depending on your medical conditions, your age, and other factors. HOME CARE INSTRUCTIONS    Have your blood pressure rechecked as directed by your health care provider.   Take medicines only as directed by your health care provider. Follow the directions carefully. Blood pressure medicines must be taken as prescribed. The medicine does not work as well when you skip doses. Skipping doses also puts you at risk for  problems.  Do not smoke.   Monitor your blood pressure at home as directed by your health care provider. SEEK MEDICAL CARE IF:   You think you are having a reaction to medicines taken.  You have recurrent headaches or feel dizzy.  You have swelling in your ankles.  You have trouble with your vision. SEEK IMMEDIATE MEDICAL CARE IF:  You develop a severe headache or confusion.  You have unusual weakness, numbness, or feel faint.  You have severe chest or abdominal pain.  You vomit repeatedly.  You have trouble breathing. MAKE SURE YOU:   Understand these instructions.  Will watch your condition.  Will get help right away if you are not doing well or get worse.   This information is not intended to replace advice given to you by your health care provider. Make sure you discuss any questions you have with your health care provider.   Document Released: 01/26/2005 Document Revised: 06/12/2014 Document Reviewed: 11/18/2012 Elsevier Interactive Patient Education 2016 [employer] Inc.  

## 2015-04-30 NOTE — Progress Notes (Signed)
 Subjective:    Patient ID: Stacey Brown, female    DOB: 12/02/1922, 82 y.o.   MRN: [id number]  Pt is brought in by grandson for chronic follow up.. Pt lives with her daughter. Hypertension This is a chronic problem. The current episode started more than 1 year ago. The problem has been waxing and waning since onset. The problem is uncontrolled. Associated symptoms include palpitations ("at times") and shortness of breath. Pertinent negatives include no blurred vision, chest pain, headaches or peripheral edema ("at times"). Risk factors for coronary artery disease include diabetes mellitus, dyslipidemia, post-menopausal state and family history. Past treatments include calcium channel blockers, beta blockers and diuretics. The current treatment provides moderate improvement. Hypertensive end-organ damage includes kidney disease. There is no history of CAD/MI, CVA, heart failure or a thyroid problem. There is no history of sleep apnea.  Diabetes She presents for her follow-up diabetic visit. She has type 2 diabetes mellitus. Her disease course has been improving. Hypoglycemia symptoms include dizziness. Pertinent negatives for hypoglycemia include no confusion, headaches or mood changes. Associated symptoms include foot paresthesias. Pertinent negatives for diabetes include no blurred vision, no chest pain, no foot ulcerations and no visual change. There are no hypoglycemic complications. Symptoms are improving. Diabetic complications include nephropathy and peripheral neuropathy. Pertinent negatives for diabetic complications include no CVA or heart disease. Risk factors for coronary artery disease include diabetes mellitus, dyslipidemia, family history, hypertension and post-menopausal. Current diabetic treatment includes oral agent (monotherapy). She is compliant with treatment all of the time. She is following a diabetic diet. Her breakfast blood glucose range is generally 90-110 mg/dl. An ACE  inhibitor/angiotensin II receptor blocker is not being taken. Eye exam is current.  Hyperlipidemia This is a chronic problem. The current episode started more than 1 year ago. The problem is controlled. Recent lipid tests were reviewed and are normal. Exacerbating diseases include diabetes. She has no history of hypothyroidism. Associated symptoms include shortness of breath. Pertinent negatives include no chest pain or myalgias. She is currently on no antihyperlipidemic treatment. The current treatment provides significant improvement of lipids. Risk factors for coronary artery disease include diabetes mellitus, dyslipidemia, family history, hypertension, a sedentary lifestyle and post-menopausal.      Review of Systems  Constitutional: Negative.   HENT: Negative.   Eyes: Negative.  Negative for blurred vision.  Respiratory: Positive for shortness of breath.   Cardiovascular: Positive for palpitations ("at times"). Negative for chest pain.  Gastrointestinal: Negative.   Endocrine: Negative.   Genitourinary: Negative.   Musculoskeletal: Negative.  Negative for myalgias.  Neurological: Positive for dizziness. Negative for headaches.  Hematological: Negative.   Psychiatric/Behavioral: Negative.  Negative for confusion.  All other systems reviewed and are negative.      Objective:   Physical Exam  Constitutional: She is oriented to person, place, and time. She appears well-developed and well-nourished. No distress.  Frail   HENT:  Head: Normocephalic and atraumatic.  Right Ear: External ear normal.  Left Ear: External ear normal.  Nose: Nose normal.  Mouth/Throat: Oropharynx is clear and moist.  Eyes: Pupils are equal, round, and reactive to light.  Neck: Normal range of motion. Neck supple. No thyromegaly present.  Cardiovascular: Normal rate, regular rhythm, normal heart sounds and intact distal pulses.   No murmur heard. Pulmonary/Chest: Effort normal. No respiratory distress.  She has no wheezes.  Diminished breath sounds bilaterally   Abdominal: Soft. Bowel sounds are normal. She exhibits no distension. There is no tenderness.  Musculoskeletal:   She exhibits edema (trace in BLE). She exhibits no tenderness (BLE).  Pt in wheelchair   Neurological: She is alert and oriented to person, place, and time.  Skin: Skin is warm and dry.  Psychiatric: She has a normal mood and affect. Her behavior is normal. Judgment and thought content normal.  Vitals reviewed.   BP 210/73 mmHg  Pulse 66  Temp(Src) 97 F (36.1 C) (Oral)  Wt 104 lb (47.174 kg)   Assessment & Plan:  1. Atrial fibrillation, unspecified type ([place]) - CMP14+EGFR  2. Essential hypertension, benign -Pt started on Losartan 50 mg today -Dash diet information given -Exercise encouraged - Stress Management  -Continue current meds -RTO in 1 weeks  - CMP14+EGFR - losartan (COZAAR) 50 MG tablet; Take 1 tablet (50 mg total) by mouth daily.  Dispense: 90 tablet; Refill: 3  3. Slow transit constipation - CMP14+EGFR  4. Diabetes mellitus without complication (HCC) - Bayer DCA Hb A1c Waived - CMP14+EGFR - Microalbumin / creatinine urine ratio  5. HOH (hard of hearing), bilateral - CMP14+EGFR  6. Arthritis - CMP14+EGFR  7. Osteoporosis - CMP14+EGFR  8. Chronic kidney disease, stage 3 (moderate) - CMP14+EGFR  9. Hyperlipidemia - CMP14+EGFR  10. History of DVT of lower extremity - CMP14+EGFR  11. At high risk for falls - CMP14+EGFR  12. Vitamin D deficiency - CMP14+EGFR   Continue all meds Labs pending Health Maintenance reviewed Diet and exercise encouraged RTO 1 weeks to recheck HTN  Evelina Dun, FNP

## 2015-05-01 DIAGNOSIS — Z4800 Encounter for change or removal of nonsurgical wound dressing: Secondary | ICD-10-CM | POA: Diagnosis not present

## 2015-05-01 DIAGNOSIS — Z87440 Personal history of urinary (tract) infections: Secondary | ICD-10-CM | POA: Diagnosis not present

## 2015-05-01 DIAGNOSIS — I4891 Unspecified atrial fibrillation: Secondary | ICD-10-CM | POA: Diagnosis not present

## 2015-05-01 DIAGNOSIS — I129 Hypertensive chronic kidney disease with stage 1 through stage 4 chronic kidney disease, or unspecified chronic kidney disease: Secondary | ICD-10-CM | POA: Diagnosis not present

## 2015-05-01 DIAGNOSIS — L89613 Pressure ulcer of right heel, stage 3: Secondary | ICD-10-CM | POA: Diagnosis not present

## 2015-05-01 DIAGNOSIS — Z86718 Personal history of other venous thrombosis and embolism: Secondary | ICD-10-CM | POA: Diagnosis not present

## 2015-05-01 DIAGNOSIS — N183 Chronic kidney disease, stage 3 (moderate): Secondary | ICD-10-CM | POA: Diagnosis not present

## 2015-05-01 DIAGNOSIS — E1143 Type 2 diabetes mellitus with diabetic autonomic (poly)neuropathy: Secondary | ICD-10-CM | POA: Diagnosis not present

## 2015-05-01 DIAGNOSIS — E440 Moderate protein-calorie malnutrition: Secondary | ICD-10-CM | POA: Diagnosis not present

## 2015-05-01 DIAGNOSIS — Z7901 Long term (current) use of anticoagulants: Secondary | ICD-10-CM | POA: Diagnosis not present

## 2015-05-01 DIAGNOSIS — E1122 Type 2 diabetes mellitus with diabetic chronic kidney disease: Secondary | ICD-10-CM | POA: Diagnosis not present

## 2015-05-01 DIAGNOSIS — K3184 Gastroparesis: Secondary | ICD-10-CM | POA: Diagnosis not present

## 2015-05-01 LAB — CMP14+EGFR
A/G RATIO: 1.7 (ref 1.2–2.2)
ALBUMIN: 4.1 g/dL (ref 3.2–4.6)
ALK PHOS: 130 IU/L — AB (ref 39–117)
ALT: 64 IU/L — ABNORMAL HIGH (ref 0–32)
AST: 77 IU/L — AB (ref 0–40)
BILIRUBIN TOTAL: 0.3 mg/dL (ref 0.0–1.2)
BUN / CREAT RATIO: 18 (ref 11–26)
BUN: 19 mg/dL (ref 10–36)
CO2: 25 mmol/L (ref 18–29)
CREATININE: 1.08 mg/dL — AB (ref 0.57–1.00)
Calcium: 9.6 mg/dL (ref 8.7–10.3)
Chloride: 105 mmol/L (ref 96–106)
GFR calc Af Amer: 52 mL/min/{1.73_m2} — ABNORMAL LOW (ref >59)
GFR calc non Af Amer: 45 mL/min/{1.73_m2} — ABNORMAL LOW (ref >59)
GLOBULIN, TOTAL: 2.4 g/dL (ref 1.5–4.5)
Glucose: 152 mg/dL — ABNORMAL HIGH (ref 65–99)
POTASSIUM: 4.3 mmol/L (ref 3.5–5.2)
SODIUM: 148 mmol/L — AB (ref 134–144)
Total Protein: 6.5 g/dL (ref 6.0–8.5)

## 2015-05-01 LAB — MICROALBUMIN / CREATININE URINE RATIO
Creatinine, Urine: 92.6 mg/dL
MICROALB/CREAT RATIO: 250.3 mg/g creat — ABNORMAL HIGH (ref 0.0–30.0)
MICROALBUM., U, RANDOM: 231.8 ug/mL

## 2015-05-02 NOTE — Progress Notes (Signed)
Patient aware.

## 2015-05-03 ENCOUNTER — Ambulatory Visit (INDEPENDENT_AMBULATORY_CARE_PROVIDER_SITE_OTHER): Payer: Medicare Other | Admitting: Family Medicine

## 2015-05-03 ENCOUNTER — Encounter: Payer: Self-pay | Admitting: Family Medicine

## 2015-05-03 VITALS — BP 174/62 | HR 65 | Temp 96.8°F | Ht 63.0 in | Wt 102.0 lb

## 2015-05-03 DIAGNOSIS — E119 Type 2 diabetes mellitus without complications: Secondary | ICD-10-CM | POA: Diagnosis not present

## 2015-05-03 DIAGNOSIS — I10 Essential (primary) hypertension: Secondary | ICD-10-CM

## 2015-05-03 DIAGNOSIS — I4891 Unspecified atrial fibrillation: Secondary | ICD-10-CM | POA: Diagnosis not present

## 2015-05-03 MED ORDER — NEBIVOLOL HCL 5 MG PO TABS: 5.0000 mg | ORAL_TABLET | Freq: Every day | ORAL | Status: DC

## 2015-05-03 MED ORDER — AMLODIPINE BESYLATE 5 MG PO TABS: 5.0000 mg | ORAL_TABLET | Freq: Every day | ORAL | Status: DC

## 2015-05-03 MED ORDER — VALSARTAN 160 MG PO TABS: 160.0000 mg | ORAL_TABLET | Freq: Every day | ORAL | Status: DC

## 2015-05-03 MED ORDER — APIXABAN 2.5 MG PO TABS: 2.5000 mg | ORAL_TABLET | Freq: Two times a day (BID) | ORAL | Status: DC

## 2015-05-03 MED ORDER — LINACLOTIDE 290 MCG PO CAPS: 290.0000 ug | ORAL_CAPSULE | Freq: Every day | ORAL | Status: DC

## 2015-05-03 MED ORDER — GLIMEPIRIDE 2 MG PO TABS: ORAL_TABLET | ORAL | Status: DC

## 2015-05-03 NOTE — Progress Notes (Signed)
Subjective:  Patient ID: Satira Sark, female    DOB: [birth year]  Age: 82 y.o. MRN: [id number]  CC: Hypertension   HPI ASUCENA VANCUREN presents for  follow-up of hypertension. Patient has no history of headache chest pain or shortness of breath or recent cough. Patient also denies symptoms of TIA such as numbness weakness lateralizing. Patient checks  blood pressure at home and has not had any elevated readings recently. Patient denies side effects from his medication. States taking it regularly.   Patient in for follow-up of atrial fibrillation. Patient denies any recent bouts of chest pain or palpitations. Additionally, patient is taking anticoagulants. Patient denies any recent excessive bleeding episodes including epistaxis, bleeding from the gums, genitalia, rectal bleeding or hematuria. Additionally there has been no excessive bruising.  History Malyka has a past medical history of Abnormal liver function test; Atrial fibrillation ([place]); Chronic anticoagulation; History of DVT of lower extremity; Primary biliary cirrhosis ([place]); H/O TB (tuberculosis); Esophageal stricture; Macular degeneration; ASCVD (arteriosclerotic cardiovascular disease); Allergy; Arthritis; Diabetes mellitus without complication ([place]); Glaucoma; Hyperlipidemia; Chronic kidney disease; Osteoporosis; Cataract; and GERD (gastroesophageal reflux disease).   She has past surgical history that includes Abdominal hysterectomy; Minor hemorrhoidectomy; Left breast biopsy; Cholecystectomy; Herniography; Right lung biopsy; Right hip replacement; Liver biopsy; and Hernia repair.   Her family history includes Breast cancer in her other; Cancer in her father; Cirrhosis in her other; Diabetes in her daughter and mother; Heart disease in her brother and mother; Hyperlipidemia in her daughter; Kidney disease in her cousin. There is no history of Colon cancer.She reports that she has never smoked. She has never used smokeless tobacco. She  reports that she does not drink alcohol or use illicit drugs.    ROS Review of Systems  Constitutional: Negative for fever, activity change and appetite change.  HENT: Negative for congestion, rhinorrhea and sore throat.   Eyes: Negative for visual disturbance.  Respiratory: Negative for cough and shortness of breath.   Cardiovascular: Negative for chest pain and palpitations.  Gastrointestinal: Negative for nausea, abdominal pain and diarrhea.  Genitourinary: Negative for dysuria.  Musculoskeletal: Negative for myalgias and arthralgias.    Objective:  BP 174/62 mmHg  Pulse 65  Temp(Src) 96.8 F (36 C) (Oral)  Ht 5\' 3"  (1.6 m)  Wt 102 lb (46.267 kg)  BMI 18.07 kg/m2  SpO2 97%  BP Readings from Last 3 Encounters:  05/03/15 174/62  04/30/15 192/71  03/23/15 178/68    Wt Readings from Last 3 Encounters:  05/03/15 102 lb (46.267 kg)  04/30/15 104 lb (47.174 kg)  01/30/15 104 lb (47.174 kg)     Physical Exam  Constitutional: She is oriented to person, place, and time. She appears well-developed and well-nourished. No distress.  HENT:  Head: Normocephalic and atraumatic.  Right Ear: External ear normal.  Left Ear: External ear normal.  Nose: Nose normal.  Mouth/Throat: Oropharynx is clear and moist.  Eyes: Conjunctivae and EOM are normal. Pupils are equal, round, and reactive to light.  Neck: Normal range of motion. Neck supple. No thyromegaly present.  Cardiovascular: Normal rate, regular rhythm and normal heart sounds.   No murmur heard. Pulmonary/Chest: Effort normal and breath sounds normal. No respiratory distress. She has no wheezes. She has no rales.  Abdominal: Soft. Bowel sounds are normal. She exhibits no distension. There is no tenderness.  Lymphadenopathy:    She has no cervical adenopathy.  Neurological: She is alert and oriented to person, place, and time. She has normal  reflexes.  Skin: Skin is warm and dry.  Psychiatric: She has a normal mood and  affect. Her behavior is normal. Judgment and thought content normal.     Lab Results  Component Value Date   WBC 7.4 01/27/2014   HGB 13.1 01/27/2014   HCT 41.4 01/27/2014   PLT 146* 08/25/2007   GLUCOSE 152* 04/30/2015   CHOL 112 07/02/2014   TRIG 249* 07/02/2014   HDL 34* 07/02/2014   LDLCALC 28 07/02/2014   ALT 64* 04/30/2015   AST 77* 04/30/2015   NA 148* 04/30/2015   K 4.3 04/30/2015   CL 105 04/30/2015   CREATININE 1.08* 04/30/2015   BUN 19 04/30/2015   CO2 25 04/30/2015   TSH 1.750 09/28/2014   INR 2.2 07/20/2013   HGBA1C 7.1 07/02/2014      Assessment & Plan:   Shambra was seen today for hypertension.  Diagnoses and all orders for this visit:  Atrial fibrillation, unspecified type ([place])  Essential hypertension, benign -     amLODipine (NORVASC) 5 MG tablet; Take 1 tablet (5 mg total) by mouth daily. -     nebivolol (BYSTOLIC) 5 MG tablet; Take 1 tablet (5 mg total) by mouth daily.  Diabetes mellitus without complication ([place])  Other orders -     valsartan (DIOVAN) 160 MG tablet; Take 1 tablet (160 mg total) by mouth daily. -     apixaban (ELIQUIS) 2.5 MG TABS tablet; Take 1 tablet (2.5 mg total) by mouth 2 (two) times daily. -     glimepiride (AMARYL) 2 MG tablet; TAKE ONE TABLET BY MOUTH ONE   TIME DAILY BEFORE BREAKFAST -     Linaclotide (LINZESS) 290 MCG CAPS capsule; Take 1 capsule (290 mcg total) by mouth daily.     I have discontinued Ms. Waldron's rosuvastatin and losartan. I am also having her start on valsartan. Additionally, I am having her maintain her ursodiol, travoprost (benzalkonium), carboxymethylcellulose, cromolyn, simethicone, trimethoprim-polymyxin b, BOOST DIABETIC, Raised Toilet Seat, Menthol-Zinc Oxide, omeprazole, potassium chloride SA, levocetirizine, fluticasone, mirtazapine, amLODipine, apixaban, glimepiride, linaclotide, and nebivolol.  Meds ordered this encounter  Medications  . valsartan (DIOVAN) 160 MG tablet    Sig: Take 1  tablet (160 mg total) by mouth daily.    Dispense:  30 tablet    Refill:  2  . amLODipine (NORVASC) 5 MG tablet    Sig: Take 1 tablet (5 mg total) by mouth daily.    Dispense:  90 tablet    Refill:  3  . apixaban (ELIQUIS) 2.5 MG TABS tablet    Sig: Take 1 tablet (2.5 mg total) by mouth 2 (two) times daily.    Dispense:  60 tablet    Refill:  5  . glimepiride (AMARYL) 2 MG tablet    Sig: TAKE ONE TABLET BY MOUTH ONE   TIME DAILY BEFORE BREAKFAST    Dispense:  30 tablet    Refill:  5  . Linaclotide (LINZESS) 290 MCG CAPS capsule    Sig: Take 1 capsule (290 mcg total) by mouth daily.    Dispense:  30 capsule    Refill:  5  . nebivolol (BYSTOLIC) 5 MG tablet    Sig: Take 1 tablet (5 mg total) by mouth daily.    Dispense:  90 tablet    Refill:  1     Follow-up: Return in about 1 month (around 06/03/2015) for hypertension.  Claretta Fraise, M.D.

## 2015-05-04 NOTE — Telephone Encounter (Signed)
Patient was seen on 3/24

## 2015-05-06 ENCOUNTER — Telehealth: Payer: Self-pay | Admitting: Family Medicine

## 2015-05-06 NOTE — Telephone Encounter (Signed)
Pt is aware that I called [facility] and verified the correct medications and they have them ready to be picked up.

## 2015-05-08 DIAGNOSIS — E440 Moderate protein-calorie malnutrition: Secondary | ICD-10-CM | POA: Diagnosis not present

## 2015-05-08 DIAGNOSIS — I129 Hypertensive chronic kidney disease with stage 1 through stage 4 chronic kidney disease, or unspecified chronic kidney disease: Secondary | ICD-10-CM | POA: Diagnosis not present

## 2015-05-08 DIAGNOSIS — Z87440 Personal history of urinary (tract) infections: Secondary | ICD-10-CM | POA: Diagnosis not present

## 2015-05-08 DIAGNOSIS — N183 Chronic kidney disease, stage 3 (moderate): Secondary | ICD-10-CM | POA: Diagnosis not present

## 2015-05-08 DIAGNOSIS — Z86718 Personal history of other venous thrombosis and embolism: Secondary | ICD-10-CM | POA: Diagnosis not present

## 2015-05-08 DIAGNOSIS — L89613 Pressure ulcer of right heel, stage 3: Secondary | ICD-10-CM | POA: Diagnosis not present

## 2015-05-08 DIAGNOSIS — K3184 Gastroparesis: Secondary | ICD-10-CM | POA: Diagnosis not present

## 2015-05-08 DIAGNOSIS — Z7901 Long term (current) use of anticoagulants: Secondary | ICD-10-CM | POA: Diagnosis not present

## 2015-05-08 DIAGNOSIS — I4891 Unspecified atrial fibrillation: Secondary | ICD-10-CM | POA: Diagnosis not present

## 2015-05-08 DIAGNOSIS — E1122 Type 2 diabetes mellitus with diabetic chronic kidney disease: Secondary | ICD-10-CM | POA: Diagnosis not present

## 2015-05-08 DIAGNOSIS — E1143 Type 2 diabetes mellitus with diabetic autonomic (poly)neuropathy: Secondary | ICD-10-CM | POA: Diagnosis not present

## 2015-05-08 DIAGNOSIS — Z4800 Encounter for change or removal of nonsurgical wound dressing: Secondary | ICD-10-CM | POA: Diagnosis not present

## 2015-05-10 ENCOUNTER — Ambulatory Visit (INDEPENDENT_AMBULATORY_CARE_PROVIDER_SITE_OTHER): Payer: Medicare Other | Admitting: Family Medicine

## 2015-05-16 ENCOUNTER — Telehealth: Payer: Self-pay | Admitting: Family

## 2015-05-16 ENCOUNTER — Telehealth: Payer: Self-pay | Admitting: *Deleted

## 2015-05-16 DIAGNOSIS — I4891 Unspecified atrial fibrillation: Secondary | ICD-10-CM | POA: Diagnosis not present

## 2015-05-16 DIAGNOSIS — Z9181 History of falling: Secondary | ICD-10-CM

## 2015-05-16 DIAGNOSIS — Z7901 Long term (current) use of anticoagulants: Secondary | ICD-10-CM | POA: Diagnosis not present

## 2015-05-16 DIAGNOSIS — Z4800 Encounter for change or removal of nonsurgical wound dressing: Secondary | ICD-10-CM | POA: Diagnosis not present

## 2015-05-16 DIAGNOSIS — M1990 Unspecified osteoarthritis, unspecified site: Secondary | ICD-10-CM

## 2015-05-16 DIAGNOSIS — L89613 Pressure ulcer of right heel, stage 3: Secondary | ICD-10-CM | POA: Diagnosis not present

## 2015-05-16 DIAGNOSIS — I129 Hypertensive chronic kidney disease with stage 1 through stage 4 chronic kidney disease, or unspecified chronic kidney disease: Secondary | ICD-10-CM | POA: Diagnosis not present

## 2015-05-16 DIAGNOSIS — K3184 Gastroparesis: Secondary | ICD-10-CM | POA: Diagnosis not present

## 2015-05-16 DIAGNOSIS — E1143 Type 2 diabetes mellitus with diabetic autonomic (poly)neuropathy: Secondary | ICD-10-CM | POA: Diagnosis not present

## 2015-05-16 DIAGNOSIS — Z86718 Personal history of other venous thrombosis and embolism: Secondary | ICD-10-CM | POA: Diagnosis not present

## 2015-05-16 DIAGNOSIS — E1122 Type 2 diabetes mellitus with diabetic chronic kidney disease: Secondary | ICD-10-CM | POA: Diagnosis not present

## 2015-05-16 DIAGNOSIS — E440 Moderate protein-calorie malnutrition: Secondary | ICD-10-CM | POA: Diagnosis not present

## 2015-05-16 DIAGNOSIS — E119 Type 2 diabetes mellitus without complications: Secondary | ICD-10-CM

## 2015-05-16 DIAGNOSIS — Z87440 Personal history of urinary (tract) infections: Secondary | ICD-10-CM | POA: Diagnosis not present

## 2015-05-16 DIAGNOSIS — N183 Chronic kidney disease, stage 3 (moderate): Secondary | ICD-10-CM | POA: Diagnosis not present

## 2015-05-16 MED ORDER — BLOOD GLUCOSE METER KIT: PACK | Status: AC

## 2015-05-16 NOTE — Telephone Encounter (Signed)
Patient aware.

## 2015-05-16 NOTE — Telephone Encounter (Signed)
Script for glucose meter and a second script for a wheelchair are at front desk. Patient aware.

## 2015-05-16 NOTE — Telephone Encounter (Signed)
RX ready for pick up 

## 2015-05-17 DIAGNOSIS — M1990 Unspecified osteoarthritis, unspecified site: Secondary | ICD-10-CM | POA: Diagnosis not present

## 2015-05-17 DIAGNOSIS — R296 Repeated falls: Secondary | ICD-10-CM | POA: Diagnosis not present

## 2015-05-22 DIAGNOSIS — Z86718 Personal history of other venous thrombosis and embolism: Secondary | ICD-10-CM | POA: Diagnosis not present

## 2015-05-22 DIAGNOSIS — I4891 Unspecified atrial fibrillation: Secondary | ICD-10-CM | POA: Diagnosis not present

## 2015-05-22 DIAGNOSIS — I129 Hypertensive chronic kidney disease with stage 1 through stage 4 chronic kidney disease, or unspecified chronic kidney disease: Secondary | ICD-10-CM | POA: Diagnosis not present

## 2015-05-22 DIAGNOSIS — K3184 Gastroparesis: Secondary | ICD-10-CM | POA: Diagnosis not present

## 2015-05-22 DIAGNOSIS — E1143 Type 2 diabetes mellitus with diabetic autonomic (poly)neuropathy: Secondary | ICD-10-CM | POA: Diagnosis not present

## 2015-05-22 DIAGNOSIS — Z7901 Long term (current) use of anticoagulants: Secondary | ICD-10-CM | POA: Diagnosis not present

## 2015-05-22 DIAGNOSIS — E1122 Type 2 diabetes mellitus with diabetic chronic kidney disease: Secondary | ICD-10-CM | POA: Diagnosis not present

## 2015-05-22 DIAGNOSIS — Z4800 Encounter for change or removal of nonsurgical wound dressing: Secondary | ICD-10-CM | POA: Diagnosis not present

## 2015-05-22 DIAGNOSIS — Z87440 Personal history of urinary (tract) infections: Secondary | ICD-10-CM | POA: Diagnosis not present

## 2015-05-22 DIAGNOSIS — E440 Moderate protein-calorie malnutrition: Secondary | ICD-10-CM | POA: Diagnosis not present

## 2015-05-22 DIAGNOSIS — N183 Chronic kidney disease, stage 3 (moderate): Secondary | ICD-10-CM | POA: Diagnosis not present

## 2015-05-22 DIAGNOSIS — L89613 Pressure ulcer of right heel, stage 3: Secondary | ICD-10-CM | POA: Diagnosis not present

## 2015-06-09 ENCOUNTER — Ambulatory Visit (INDEPENDENT_AMBULATORY_CARE_PROVIDER_SITE_OTHER): Payer: Medicare Other | Admitting: Family Medicine

## 2015-06-09 DIAGNOSIS — F0390 Unspecified dementia without behavioral disturbance: Secondary | ICD-10-CM | POA: Diagnosis not present

## 2015-06-09 DIAGNOSIS — E1143 Type 2 diabetes mellitus with diabetic autonomic (poly)neuropathy: Secondary | ICD-10-CM | POA: Diagnosis not present

## 2015-06-09 DIAGNOSIS — E1122 Type 2 diabetes mellitus with diabetic chronic kidney disease: Secondary | ICD-10-CM

## 2015-06-09 DIAGNOSIS — I129 Hypertensive chronic kidney disease with stage 1 through stage 4 chronic kidney disease, or unspecified chronic kidney disease: Secondary | ICD-10-CM | POA: Diagnosis not present

## 2015-06-09 DIAGNOSIS — N183 Chronic kidney disease, stage 3 (moderate): Secondary | ICD-10-CM | POA: Diagnosis not present

## 2015-06-09 DIAGNOSIS — L89613 Pressure ulcer of right heel, stage 3: Secondary | ICD-10-CM

## 2015-06-16 DIAGNOSIS — R296 Repeated falls: Secondary | ICD-10-CM | POA: Diagnosis not present

## 2015-06-16 DIAGNOSIS — M1990 Unspecified osteoarthritis, unspecified site: Secondary | ICD-10-CM | POA: Diagnosis not present

## 2015-07-17 DIAGNOSIS — M1990 Unspecified osteoarthritis, unspecified site: Secondary | ICD-10-CM | POA: Diagnosis not present

## 2015-07-17 DIAGNOSIS — R296 Repeated falls: Secondary | ICD-10-CM | POA: Diagnosis not present

## 2015-08-05 ENCOUNTER — Other Ambulatory Visit: Payer: Self-pay | Admitting: Family Medicine

## 2015-08-05 ENCOUNTER — Other Ambulatory Visit: Payer: Self-pay | Admitting: Family

## 2015-08-16 DIAGNOSIS — M1990 Unspecified osteoarthritis, unspecified site: Secondary | ICD-10-CM | POA: Diagnosis not present

## 2015-08-16 DIAGNOSIS — R296 Repeated falls: Secondary | ICD-10-CM | POA: Diagnosis not present

## 2015-08-30 ENCOUNTER — Ambulatory Visit (INDEPENDENT_AMBULATORY_CARE_PROVIDER_SITE_OTHER): Payer: Medicare Other | Admitting: Family Medicine

## 2015-08-30 ENCOUNTER — Encounter: Payer: Self-pay | Admitting: Family Medicine

## 2015-08-30 VITALS — BP 153/67 | HR 55 | Temp 96.8°F | Ht 63.0 in | Wt 104.6 lb

## 2015-08-30 DIAGNOSIS — I10 Essential (primary) hypertension: Secondary | ICD-10-CM | POA: Diagnosis not present

## 2015-08-30 DIAGNOSIS — E119 Type 2 diabetes mellitus without complications: Secondary | ICD-10-CM

## 2015-08-30 DIAGNOSIS — E785 Hyperlipidemia, unspecified: Secondary | ICD-10-CM | POA: Diagnosis not present

## 2015-08-30 LAB — BAYER DCA HB A1C WAIVED: HB A1C (BAYER DCA - WAIVED): 6.3 % (ref <7.0)

## 2015-08-30 NOTE — Progress Notes (Signed)
   HPI  Patient presents today here for follow-up of diabetes, hypertension, and hyperlipidemia.  Hypertension Good medication compliance Hx of blood pressure a few times a week at home and it often 295-188 systolic. No chest pain, dyspnea, palpitations, or leg edema. She has known history of A. fib but does not have any bleeding sources. She is on eliquis No recent falls.  Hyperlipidemia No statins due to age  Diabetes Average fasting blood sugar is 70-100, no hypoglycemia Postprandials are 180-220. She's only taking Amaryl. She has intermittent foot numbness and tingling.  PMH: Smoking status noted ROS: Per HPI  Objective: BP 158/69 mmHg  Pulse 55  Temp(Src) 96.8 F (36 C) (Oral)  Ht _0  (1.6 m)  Wt 104 lb 9.6 oz (47.446 kg)  BMI 18.53 kg/m2 Gen: NAD, alert, cooperative with exam HEENT: NCAT CV: RRR, good S1/S2, no murmur Resp: CTABL, no wheezes, non-labored Ext: No edema, warm Neuro: Alert and oriented, No gross deficits  Assessment and plan:  # Type 2 diabetes Well-controlled on Amaryl, no changes Positive diabetic retinopathy and nephropathy She is on an ARB. Also some signs of neuropathy, although inconsistent and not bothersome at this point.  # Hypertension Recently well-controlled on current medications including beta blocker, calcium channel blocker, and ARB. Repeating labs today  # Hyperlipidemia Need to check labs, no statins per her age  Multiple aches and pains complaint Recommended Aspercreme to the shoulder and feet as needed.   Orders Placed This Encounter  Procedures  . Bayer DCA Hb A1c Waived  . CMP14+EGFR  . CBC with Differential/Platelet  . Lipid panel  . Thyroid Panel With TSH    Laroy Apple, MD [facility] 08/30/2015, 9:38 AM

## 2015-08-30 NOTE — Patient Instructions (Addendum)
Great to meet you!  Lets have you back in 3 months to follow up for diabetes  Consider trying Aspercreme for your shoulder and foot pain

## 2015-08-31 LAB — CMP14+EGFR
A/G RATIO: 1.3 (ref 1.2–2.2)
ALT: 11 IU/L (ref 0–32)
AST: 21 IU/L (ref 0–40)
Albumin: 4.1 g/dL (ref 3.2–4.6)
Alkaline Phosphatase: 130 IU/L — ABNORMAL HIGH (ref 39–117)
BUN/Creatinine Ratio: 19 (ref 12–28)
BUN: 18 mg/dL (ref 10–36)
Bilirubin Total: 0.6 mg/dL (ref 0.0–1.2)
CALCIUM: 9.8 mg/dL (ref 8.7–10.3)
CO2: 23 mmol/L (ref 18–29)
CREATININE: 0.95 mg/dL (ref 0.57–1.00)
Chloride: 102 mmol/L (ref 96–106)
GFR, EST AFRICAN AMERICAN: 61 mL/min/{1.73_m2} (ref >59)
GFR, EST NON AFRICAN AMERICAN: 53 mL/min/{1.73_m2} — AB (ref >59)
Globulin, Total: 3.1 g/dL (ref 1.5–4.5)
Glucose: 89 mg/dL (ref 65–99)
POTASSIUM: 4 mmol/L (ref 3.5–5.2)
Sodium: 144 mmol/L (ref 134–144)
TOTAL PROTEIN: 7.2 g/dL (ref 6.0–8.5)

## 2015-08-31 LAB — CBC WITH DIFFERENTIAL/PLATELET
BASOS: 0 %
Basophils Absolute: 0 10*3/uL (ref 0.0–0.2)
EOS (ABSOLUTE): 0.1 10*3/uL (ref 0.0–0.4)
EOS: 1 %
HEMATOCRIT: 38.2 % (ref 34.0–46.6)
HEMOGLOBIN: 12.4 g/dL (ref 11.1–15.9)
IMMATURE GRANS (ABS): 0 10*3/uL (ref 0.0–0.1)
Immature Granulocytes: 0 %
LYMPHS: 18 %
Lymphocytes Absolute: 1.7 10*3/uL (ref 0.7–3.1)
MCH: 29 pg (ref 26.6–33.0)
MCHC: 32.5 g/dL (ref 31.5–35.7)
MCV: 90 fL (ref 79–97)
MONOCYTES: 6 %
Monocytes Absolute: 0.6 10*3/uL (ref 0.1–0.9)
NEUTROS ABS: 6.8 10*3/uL (ref 1.4–7.0)
Neutrophils: 75 %
Platelets: 239 10*3/uL (ref 150–379)
RBC: 4.27 x10E6/uL (ref 3.77–5.28)
RDW: 15.6 % — ABNORMAL HIGH (ref 12.3–15.4)
WBC: 9.1 10*3/uL (ref 3.4–10.8)

## 2015-08-31 LAB — THYROID PANEL WITH TSH
FREE THYROXINE INDEX: 2 (ref 1.2–4.9)
T3 Uptake Ratio: 31 % (ref 24–39)
T4, Total: 6.5 ug/dL (ref 4.5–12.0)
TSH: 4.62 u[IU]/mL — ABNORMAL HIGH (ref 0.450–4.500)

## 2015-09-03 ENCOUNTER — Other Ambulatory Visit: Payer: Self-pay | Admitting: Family Medicine

## 2015-09-03 MED ORDER — POLYETHYLENE GLYCOL 3350 17 GM/SCOOP PO POWD: 17.0000 g | Freq: Every day | ORAL | 1 refills | Status: DC

## 2015-09-03 NOTE — Telephone Encounter (Signed)
Medication sent to pharmacy  

## 2015-09-16 DIAGNOSIS — R296 Repeated falls: Secondary | ICD-10-CM | POA: Diagnosis not present

## 2015-09-16 DIAGNOSIS — M1990 Unspecified osteoarthritis, unspecified site: Secondary | ICD-10-CM | POA: Diagnosis not present

## 2015-09-28 DIAGNOSIS — E113291 Type 2 diabetes mellitus with mild nonproliferative diabetic retinopathy without macular edema, right eye: Secondary | ICD-10-CM | POA: Diagnosis not present

## 2015-09-28 DIAGNOSIS — H401113 Primary open-angle glaucoma, right eye, severe stage: Secondary | ICD-10-CM | POA: Diagnosis not present

## 2015-09-28 DIAGNOSIS — H353131 Nonexudative age-related macular degeneration, bilateral, early dry stage: Secondary | ICD-10-CM | POA: Diagnosis not present

## 2015-09-28 DIAGNOSIS — H04123 Dry eye syndrome of bilateral lacrimal glands: Secondary | ICD-10-CM | POA: Diagnosis not present

## 2015-09-28 LAB — HM DIABETES EYE EXAM

## 2015-09-29 ENCOUNTER — Other Ambulatory Visit: Payer: Self-pay | Admitting: Family

## 2015-09-29 DIAGNOSIS — R630 Anorexia: Secondary | ICD-10-CM

## 2015-10-17 DIAGNOSIS — M1990 Unspecified osteoarthritis, unspecified site: Secondary | ICD-10-CM | POA: Diagnosis not present

## 2015-10-17 DIAGNOSIS — R296 Repeated falls: Secondary | ICD-10-CM | POA: Diagnosis not present

## 2015-10-28 ENCOUNTER — Other Ambulatory Visit: Payer: Self-pay | Admitting: Family Medicine

## 2015-11-08 ENCOUNTER — Other Ambulatory Visit: Payer: Self-pay | Admitting: Family Medicine

## 2015-11-08 DIAGNOSIS — I10 Essential (primary) hypertension: Secondary | ICD-10-CM

## 2015-11-11 ENCOUNTER — Other Ambulatory Visit: Payer: Self-pay | Admitting: Family Medicine

## 2015-11-16 DIAGNOSIS — M1990 Unspecified osteoarthritis, unspecified site: Secondary | ICD-10-CM | POA: Diagnosis not present

## 2015-11-16 DIAGNOSIS — R296 Repeated falls: Secondary | ICD-10-CM | POA: Diagnosis not present

## 2015-11-17 ENCOUNTER — Other Ambulatory Visit: Payer: Self-pay | Admitting: Family Medicine

## 2015-11-25 ENCOUNTER — Other Ambulatory Visit: Payer: Self-pay | Admitting: Family Medicine

## 2015-12-06 ENCOUNTER — Encounter: Payer: Self-pay | Admitting: Family Medicine

## 2015-12-06 ENCOUNTER — Encounter (INDEPENDENT_AMBULATORY_CARE_PROVIDER_SITE_OTHER): Payer: Self-pay

## 2015-12-06 ENCOUNTER — Ambulatory Visit (INDEPENDENT_AMBULATORY_CARE_PROVIDER_SITE_OTHER): Payer: Medicare Other | Admitting: Family Medicine

## 2015-12-06 VITALS — BP 159/67 | HR 62 | Temp 96.9°F | Ht 63.0 in | Wt 106.0 lb

## 2015-12-06 DIAGNOSIS — M25512 Pain in left shoulder: Secondary | ICD-10-CM | POA: Diagnosis not present

## 2015-12-06 DIAGNOSIS — I4891 Unspecified atrial fibrillation: Secondary | ICD-10-CM | POA: Diagnosis not present

## 2015-12-06 DIAGNOSIS — M25511 Pain in right shoulder: Secondary | ICD-10-CM | POA: Diagnosis not present

## 2015-12-06 DIAGNOSIS — Z23 Encounter for immunization: Secondary | ICD-10-CM | POA: Diagnosis not present

## 2015-12-06 DIAGNOSIS — E119 Type 2 diabetes mellitus without complications: Secondary | ICD-10-CM | POA: Diagnosis not present

## 2015-12-06 LAB — CBC WITH DIFFERENTIAL/PLATELET
BASOS ABS: 0 10*3/uL (ref 0.0–0.2)
BASOS: 0 %
EOS (ABSOLUTE): 0.1 10*3/uL (ref 0.0–0.4)
Eos: 1 %
HEMOGLOBIN: 12.6 g/dL (ref 11.1–15.9)
Hematocrit: 38.5 % (ref 34.0–46.6)
IMMATURE GRANS (ABS): 0 10*3/uL (ref 0.0–0.1)
IMMATURE GRANULOCYTES: 0 %
LYMPHS: 26 %
Lymphocytes Absolute: 2 10*3/uL (ref 0.7–3.1)
MCH: 29.4 pg (ref 26.6–33.0)
MCHC: 32.7 g/dL (ref 31.5–35.7)
MCV: 90 fL (ref 79–97)
MONOCYTES: 7 %
Monocytes Absolute: 0.5 10*3/uL (ref 0.1–0.9)
NEUTROS PCT: 66 %
Neutrophils Absolute: 5.1 10*3/uL (ref 1.4–7.0)
PLATELETS: 208 10*3/uL (ref 150–379)
RBC: 4.29 x10E6/uL (ref 3.77–5.28)
RDW: 14.7 % (ref 12.3–15.4)
WBC: 7.7 10*3/uL (ref 3.4–10.8)

## 2015-12-06 LAB — CMP14+EGFR
ALBUMIN: 3.9 g/dL (ref 3.2–4.6)
ALT: 6 IU/L (ref 0–32)
AST: 14 IU/L (ref 0–40)
Albumin/Globulin Ratio: 1.3 (ref 1.2–2.2)
Alkaline Phosphatase: 139 IU/L — ABNORMAL HIGH (ref 39–117)
BUN/Creatinine Ratio: 19 (ref 12–28)
BUN: 17 mg/dL (ref 10–36)
Bilirubin Total: 0.6 mg/dL (ref 0.0–1.2)
CALCIUM: 9.4 mg/dL (ref 8.7–10.3)
CO2: 23 mmol/L (ref 18–29)
Chloride: 103 mmol/L (ref 96–106)
Creatinine, Ser: 0.91 mg/dL (ref 0.57–1.00)
GFR, EST AFRICAN AMERICAN: 64 mL/min/{1.73_m2} (ref >59)
GFR, EST NON AFRICAN AMERICAN: 56 mL/min/{1.73_m2} — AB (ref >59)
GLUCOSE: 89 mg/dL (ref 65–99)
Globulin, Total: 3.1 g/dL (ref 1.5–4.5)
Potassium: 4 mmol/L (ref 3.5–5.2)
Sodium: 146 mmol/L — ABNORMAL HIGH (ref 134–144)
TOTAL PROTEIN: 7 g/dL (ref 6.0–8.5)

## 2015-12-06 LAB — BAYER DCA HB A1C WAIVED: HB A1C (BAYER DCA - WAIVED): 6.4 % (ref <7.0)

## 2015-12-06 NOTE — Patient Instructions (Addendum)
Great to see you!  Lets stop her diabetes medicine, glimepiride

## 2015-12-06 NOTE — Progress Notes (Signed)
HPI  Patient presents today here to follow-up for diabetes, hypertension, and impacted cerumen.  She also complains of shoulder pain The pain is described as bilateral shoulder pain, taking in nature, it responds to topical creams She was previously taking Tylenol which was stopped due to elevated liver enzymes.  Type 2 diabetes She brings in 3 blood sugars were checked in the last 2 days Fasting blood sugar ranged 57-90 No symptoms of hypoglycemia Not really watching her diet, but good appetite  Atrial fibrillation, anticoagulation No history of falls since January of this year She is very unstable on her feet She reports history of blood clot in the leg and also another clot in the kidney We have discussed risks and benefits of anticoagulation considering her risk of falling and she and her daughter would like to continue anticoagulation cautiously for now.  PMH: Smoking status noted ROS: Per HPI  Objective: BP (!) 159/67   Pulse 62   Temp (!) 96.9 F (36.1 C) (Oral)   Ht 5' 3" (1.6 m)   Wt 106 lb (48.1 kg)   BMI 18.78 kg/m  Gen: NAD, alert, cooperative with exam HEENT: NCAT CV: RRR, good S1/S2, no murmur Resp: CTABL, no wheezes, non-labored Ext: No edema, warm Neuro: Alert and oriented, No gross deficits MSK: Area above her clavicle on the right is more sunken in compared to the left, no tenderness to palpation  Diabetic Foot Exam - Simple   Simple Foot Form Visual Inspection No deformities, no ulcerations, no other skin breakdown bilaterally:  Yes Sensation Testing Intact to touch and monofilament testing bilaterally:  Yes Pulse Check Posterior Tibialis and Dorsalis pulse intact bilaterally:  Yes Comments Thickened toenails bilaterally      Assessment and plan:  # Type 2 diabetes A1c 6.4, stable Stopping Amaryl today, considering age and blood sugar of 57 on very small sample of blood sugars I think it is prudent to go ahead and back off and restart  lower dose Amaryl if needed Recheck in 3 months Referring 2 alternative podiatrist, her previous one in [place] is no longer seeing patients  # Shoulder pain Unusual exam - possibly positional Mild symptoms Encouraged her to continue using topical creams if they're helping   # Atrial fibrillation Rate controlled She is chronically anticoagulated She has history of clot I discussed her high risk of falls given her frailty and that she is in a wheelchair today After lengthy discussion she and her daughter have decided to continue anticoagulation feeling that there is more benefit than risk.    Orders Placed This Encounter  Procedures  . Flu vaccine HIGH DOSE PF  . Bayer DCA Hb A1c Waived  . CBC with Differential  . CMP14+EGFR    Sam , MD [facility] 12/06/2015, 12:05 PM     

## 2015-12-12 ENCOUNTER — Telehealth: Payer: Self-pay | Admitting: Family Medicine

## 2015-12-12 NOTE — Telephone Encounter (Signed)
Alt to review recent labs.  On reflexion of the patient she had left greater than right supraclavicular fullness, this could be Virchow's node concerning for malignancy, this also has some concern with her elevated alkaline phosphatase that is trending up over the last 6 months.  We'll attempt to call again.  I called and left one voice mill that stated if she could call back for follow-up in about a month to discuss. However I did not say my concerns for malignancy. I would like to discuss this with her personally or with her family.   Laroy Apple, MD [facility] 12/12/2015, 2:36 PM

## 2015-12-16 ENCOUNTER — Telehealth: Payer: Self-pay | Admitting: Family Medicine

## 2015-12-16 DIAGNOSIS — D487 Neoplasm of uncertain behavior of other specified sites: Principal | ICD-10-CM

## 2015-12-16 DIAGNOSIS — R590 Localized enlarged lymph nodes: Secondary | ICD-10-CM

## 2015-12-16 NOTE — Telephone Encounter (Signed)
Called and discussed elevated Alk phos plus L supraclavicular lymph node prominence causes some concern occult malignancy in the chest or abdomen.   Start work up with chest x ray  Pts family will bring in this week for x ray  Laroy Apple, MD [facility] 12/16/2015, 5:35 PM

## 2015-12-17 DIAGNOSIS — R296 Repeated falls: Secondary | ICD-10-CM | POA: Diagnosis not present

## 2015-12-17 DIAGNOSIS — M1990 Unspecified osteoarthritis, unspecified site: Secondary | ICD-10-CM | POA: Diagnosis not present

## 2015-12-20 ENCOUNTER — Other Ambulatory Visit: Payer: Medicare Other

## 2015-12-30 ENCOUNTER — Ambulatory Visit (INDEPENDENT_AMBULATORY_CARE_PROVIDER_SITE_OTHER): Payer: Medicare Other

## 2015-12-30 ENCOUNTER — Ambulatory Visit (INDEPENDENT_AMBULATORY_CARE_PROVIDER_SITE_OTHER): Payer: Medicare Other | Admitting: Pharmacist

## 2015-12-30 ENCOUNTER — Encounter: Payer: Self-pay | Admitting: Pharmacist

## 2015-12-30 VITALS — BP 148/60 | HR 70 | Ht 63.0 in | Wt 106.0 lb

## 2015-12-30 DIAGNOSIS — D487 Neoplasm of uncertain behavior of other specified sites: Secondary | ICD-10-CM

## 2015-12-30 DIAGNOSIS — Z0000 Encounter for general adult medical examination without abnormal findings: Secondary | ICD-10-CM | POA: Diagnosis not present

## 2015-12-30 DIAGNOSIS — R590 Localized enlarged lymph nodes: Secondary | ICD-10-CM

## 2015-12-30 DIAGNOSIS — M810 Age-related osteoporosis without current pathological fracture: Secondary | ICD-10-CM

## 2015-12-30 NOTE — Progress Notes (Signed)
Patient ID: Destinae S Fahr, female   DOB: 12/12/1922, 82 y.o.   MRN: [id number]     Subjective:   Sinthia S Gowdy is a 82 y.o. female who presents for a SUBSEQUENT Medicare Annual Wellness Visit.  Mrs. Sapp is widowed.  She lives with her daughter Ruby.  Mrs. Tindel was hospitalized about 14 months ago.  She was then in rehab for about 3 months in a nursing home.  Her family made the decision to bring her home and she now resides with her daughter who is her primary care giver.  He grandson also helps during the day while her daughter is working.  Both are present with patient today.   Mrs. Losier appears happy today.  She is in wheelchair and this is how she ambulates away from home.  At home she has a rolling walker with seat that she uses.  Her daughter has elevated toilet seat for patient.   Current Medications (verified) Outpatient Encounter Prescriptions as of 12/30/2015  Medication Sig  . amLODipine (NORVASC) 5 MG tablet Take 1 tablet (5 mg total) by mouth daily.  . blood glucose meter kit and supplies Dispense based on patient and insurance preference. Use up to four times daily as directed. (FOR ICD-9 250.00, 250.01).  . BYSTOLIC 5 MG tablet TAKE ONE TABLET BY MOUTH ONCE DAILY  . carboxymethylcellulose (REFRESH PLUS) 0.5 % SOLN 1 drop 3 (three) times daily as needed.  . cromolyn (OPTICROM) 4 % ophthalmic solution   . ELIQUIS 2.5 MG TABS tablet TAKE ONE TABLET BY MOUTH TWICE DAILY  . fluticasone (FLONASE) 50 MCG/ACT nasal spray Place 2 sprays into both nostrils daily.  . glucose blood (ACCU-CHEK AVIVA PLUS) test strip Test up to tid daily. DX E11.9  . LINZESS 290 MCG CAPS capsule TAKE ONE CAPSULE BY MOUTH ONCE DAILY  . Menthol-Zinc Oxide (CALMOSEPTINE) 0.44-20.6 % OINT Use BID on bottom and as needed  . Misc. Devices (RAISED TOILET SEAT) MISC Use as directed.  Dx:  High fall risk R 29.6 and osteoporosis M 81.0  . polyethylene glycol powder (GLYCOLAX/MIRALAX) powder Take 17 g by mouth  daily.  . STARCH-MALTO DEXTRIN (THICK-IT) POWD Take by mouth.  . travoprost, benzalkonium, (TRAVATAN) 0.004 % ophthalmic solution Place 1 drop into both eyes at bedtime.  . valsartan (DIOVAN) 160 MG tablet TAKE ONE TABLET BY MOUTH ONCE DAILY  . mirtazapine (REMERON) 7.5 MG tablet TAKE ONE TABLET BY MOUTH ONCE DAILY AT BEDTIME (Patient not taking: Reported on 12/30/2015)  . trimethoprim-polymyxin b (POLYTRIM) ophthalmic solution Place 1 drop into both eyes every 6 (six) hours. (Patient not taking: Reported on 12/30/2015)   No facility-administered encounter medications on file as of 12/30/2015.     Allergies (verified) Dexilant [dexlansoprazole] and Ace inhibitors   History: Past Medical History:  Diagnosis Date  . Abnormal liver function test   . Allergy    allergic  rhinitis  . Arthritis   . ASCVD (arteriosclerotic cardiovascular disease)   . Atrial fibrillation ([facility])   . Cataract   . Chronic anticoagulation   . Chronic kidney disease   . Diabetes mellitus without complication ([facility])   . Esophageal stricture   . GERD (gastroesophageal reflux disease)   . Glaucoma   . H/O TB (tuberculosis)   . History of DVT of lower extremity   . Hyperlipidemia   . Macular degeneration   . Osteoporosis   . Primary biliary cirrhosis   . Swallowing difficulty    Past Surgical History:    Procedure Laterality Date  . ABDOMINAL HYSTERECTOMY    . CHOLECYSTECTOMY    . HERNIA REPAIR    . Herniography    . Left breast biopsy    . LIVER BIOPSY    . MINOR HEMORRHOIDECTOMY    . Right hip replacement    . Right lung biopsy     Family History  Problem Relation Age of Onset  . Breast cancer Other     Neice  . Heart disease Mother   . Diabetes Mother     Brother,  . Heart disease Brother   . Kidney disease Cousin   . Cancer Father   . Osteoporosis Sister   . Hyperlipidemia Daughter   . Diabetes Daughter   . Diabetes Brother   . Thyroid disease Brother   . Cirrhosis Other     Nephew  .  Colon cancer Neg Hx    Social History   Occupational History  . Retired     Disabled   Social History Main Topics  . Smoking status: Never Smoker  . Smokeless tobacco: Never Used  . Alcohol use No  . Drug use: No  . Sexual activity: Not on file    Do you feel safe at home?  Yes  Dietary issues and exercise activities: Current Exercise Habits: Home exercise routine, Time (Minutes): 10, Frequency (Times/Week): 5, Weekly Exercise (Minutes/Week): 50, Intensity: Mild  Current Dietary habits:  Meals are prepared by daughter or grandson.  Patient doesn't cook.  She reports good appetite but weight has decreased over the last year but increased slightly since when she was in SNF.   Tries to limit sodium and sugar intake.   Objective:    Today's Vitals   12/30/15 1608 12/30/15 1621  BP: (!) 152/62 (!) 148/60  Pulse: 70   Weight: 106 lb (48.1 kg)   Height: 5' 3" (1.6 m)   PainSc: 0-No pain    Body mass index is 18.78 kg/m.  Activities of Daily Living In your present state of health, do you have any difficulty performing the following activities: 12/30/2015  Hearing? Y  Vision? Y  Difficulty concentrating or making decisions? N  Walking or climbing stairs? Y  Dressing or bathing? Y  Doing errands, shopping? Y  Preparing Food and eating ? Y  Using the Toilet? Y  In the past six months, have you accidently leaked urine? N  Do you have problems with loss of bowel control? N  Managing your Medications? Y  Managing your Finances? Y  Housekeeping or managing your Housekeeping? Y  Some recent data might be hidden     Cardiac Risk Factors include: advanced age (>55men, >65 women);diabetes mellitus;hypertension;microalbuminuria;sedentary lifestyle  Depression Screen PHQ 2/9 Scores 12/30/2015 12/06/2015 08/30/2015 05/03/2015  PHQ - 2 Score 0 0 0 0     Fall Risk Fall Risk  12/30/2015 12/06/2015 08/30/2015 05/03/2015 01/30/2015  Falls in the past year? No No No Yes No  Number  falls in past yr: - - - 1 -  Injury with Fall? - - - No -    6CIT Screen 12/30/2015  What Year? 4 points  What month? 0 points  What time? 0 points  Count back from 20 4 points  Months in reverse 4 points  Repeat phrase 10 points  Total Score 22    Cognitive Function: MMSE - Mini Mental State Exam 06/01/2014  Orientation to time 0  Orientation to Place 2  Registration 3  Attention/ Calculation 5  Recall   1  Language- name 2 objects 2  Language- repeat 1  Language- follow 3 step command 3  Language- read & follow direction 1  Write a sentence 0  Copy design 0  Total score 18    Immunizations and Health Maintenance Immunization History  Administered Date(s) Administered  . Influenza, High Dose Seasonal PF 12/06/2015  . Influenza,inj,Quad PF,36+ Mos 11/03/2012  . Pneumococcal Conjugate-13 11/01/2013  . Tdap 05/19/2012   Health Maintenance Due  Topic Date Due  . ZOSTAVAX  12/30/1984  . PNA vac Low Risk Adult (2 of 2 - PPSV23) 11/02/2014  . OPHTHALMOLOGY EXAM  05/22/2015    Patient Care Team: Timmothy Euler, MD as PCP - General (Family Medicine)  Indicate any recent Medical Services you may have received from other than [facility] providers in the past year (date may be approximate).    Assessment:    Annual Wellness Visit  Underweight   Screening Tests Health Maintenance  Topic Date Due  . ZOSTAVAX  12/30/1984  . PNA vac Low Risk Adult (2 of 2 - PPSV23) 11/02/2014  . OPHTHALMOLOGY EXAM  05/22/2015  . MAMMOGRAM  04/29/2016 (Originally 12/31/1940)  . DEXA SCAN  01/04/2016  . FOOT EXAM  04/29/2016  . HEMOGLOBIN A1C  06/05/2016  . TETANUS/TDAP  05/20/2022  . INFLUENZA VACCINE  Addressed        Plan:   During the course of the visit Berlynn was educated and counseled about the following appropriate screening and preventive services:   Vaccines to include Pneumoccal, Influenza, Td, Zostavax - need to verify past pneumovax 23 vaccine - will request paper chart  to verify.  Also discussed Zostavax - patient declined.  Colorectal cancer screening - declined / no longer required due to age and health  Cardiovascular disease screening - last EKG was 2014  Lipids last 06/2014  BP elevated - discussed limiting high sodium food and DASH diet.  Also discussed weight - recommend increase lean proteins.  Discussed adding high protein shake once daily. Goal weigh is 115lbs.  Diabetes - controlled - last A1c was 6.4%   Bone Denisty / Osteoporosis Screening -last 01/03/2014 - referral sent  Mammogram - declined / no longer required due to age and health. Patient reminded to continue breast self exams and notify PCP of any abnormalities  Glaucoma screening / Diabetic Eye Exam - UTD but need to request records from Dr Rona Ravens  Advanced Directives - UTD  Physical Activity - chair exercises as able - continue using resisitance bands   Goals    . Gain weight          Goal weight is 115 lbs.  I have given you a prescription for Boost for diabetics that might help with nutrition and to increase weight        Patient Instructions (the written plan) were given to the patient.   Cherre Robins, PharmD   12/30/2015

## 2015-12-30 NOTE — Patient Instructions (Signed)
  Ms. Stacey Brown , Thank you for taking time to come for your Medicare Wellness Visit. I appreciate your ongoing commitment to your health goals. Please review the following plan we discussed and let me know if I can assist you in the future.   These are the goals we discussed: Goals    . Gain weight          Goal weight is 115 lbs.  I have given you a prescription for Boost for diabetics that might help with nutrition and to increase weight       This is a list of the screening recommended for you and due dates:  Health Maintenance  Topic Date Due  . Shingles Vaccine  12/30/1984  . Pneumonia vaccines (2 of 2 - PPSV23) 11/02/2014  . Eye exam for diabetics  05/22/2015  . Mammogram  04/29/2016*  . DEXA scan (bone density measurement)  01/04/2016  . Complete foot exam   04/29/2016  . Hemoglobin A1C  06/05/2016  . Tetanus Vaccine  05/20/2022  . Flu Shot  Addressed  *Topic was postponed. The date shown is not the original due date.

## 2015-12-31 ENCOUNTER — Telehealth: Payer: Self-pay | Admitting: Family Medicine

## 2015-12-31 DIAGNOSIS — J90 Pleural effusion, not elsewhere classified: Secondary | ICD-10-CM

## 2015-12-31 DIAGNOSIS — D487 Neoplasm of uncertain behavior of other specified sites: Principal | ICD-10-CM

## 2015-12-31 DIAGNOSIS — R590 Localized enlarged lymph nodes: Secondary | ICD-10-CM

## 2015-12-31 NOTE — Telephone Encounter (Signed)
PT with CXR with virchow's node. Some concern for underlying malignancy.   Pleural effusions seen on CXR< will proceed with CT chest.   Laroy Apple, MD [facility] 12/31/2015, 3:45 PM

## 2016-01-09 ENCOUNTER — Ambulatory Visit (HOSPITAL_COMMUNITY)
Admission: RE | Admit: 2016-01-09 | Discharge: 2016-01-09 | Disposition: A | Discharge status: Home / Self Care | Payer: Medicare Other | Source: Ambulatory Visit | Attending: Family Medicine | Admitting: Family Medicine

## 2016-01-09 DIAGNOSIS — J9811 Atelectasis: Secondary | ICD-10-CM | POA: Insufficient documentation

## 2016-01-09 DIAGNOSIS — I700 Atherosclerosis of aorta: Secondary | ICD-10-CM | POA: Diagnosis not present

## 2016-01-09 DIAGNOSIS — J90 Pleural effusion, not elsewhere classified: Secondary | ICD-10-CM | POA: Insufficient documentation

## 2016-01-09 DIAGNOSIS — R590 Localized enlarged lymph nodes: Secondary | ICD-10-CM | POA: Insufficient documentation

## 2016-01-09 DIAGNOSIS — D487 Neoplasm of uncertain behavior of other specified sites: Secondary | ICD-10-CM

## 2016-01-09 DIAGNOSIS — I2510 Atherosclerotic heart disease of native coronary artery without angina pectoris: Secondary | ICD-10-CM | POA: Diagnosis not present

## 2016-01-14 ENCOUNTER — Telehealth: Payer: Self-pay | Admitting: Family Medicine

## 2016-01-14 NOTE — Telephone Encounter (Signed)
She will call again tomorrow

## 2016-01-15 ENCOUNTER — Telehealth: Payer: Self-pay | Admitting: Family Medicine

## 2016-01-15 NOTE — Telephone Encounter (Signed)
Patient daughter aware of results and verbalizes understanding. She will call and make appointment to follow up.

## 2016-01-16 DIAGNOSIS — M1990 Unspecified osteoarthritis, unspecified site: Secondary | ICD-10-CM | POA: Diagnosis not present

## 2016-01-16 DIAGNOSIS — R296 Repeated falls: Secondary | ICD-10-CM | POA: Diagnosis not present

## 2016-01-22 ENCOUNTER — Other Ambulatory Visit: Payer: Self-pay | Admitting: Family Medicine

## 2016-01-22 ENCOUNTER — Telehealth: Payer: Self-pay | Admitting: *Deleted

## 2016-01-22 NOTE — Telephone Encounter (Signed)
Patient has an appointment on 01/31/16 with Dr Wendi Snipes. [employer] contacted [place] asking that we code the following during this visit if appropriate. Dropping the appropriate code ensures accurate reimbursement for the care.  I was provided with the with [facility] code but not the ICD-10 code for the following:  158 Pressure Ulcer with full thickness loss 048 Coagulation Disorder 071 Paraplegia 136 CKD, Stage 5 075 Myasthenia Gravis 028 Cirrhosis of the liver 085 CHF 0124 Exudative macular degeneration 012 fibrosis of the lung 058 Major depression 107 Vascular disease with complications

## 2016-01-31 ENCOUNTER — Ambulatory Visit (INDEPENDENT_AMBULATORY_CARE_PROVIDER_SITE_OTHER): Payer: Medicare Other | Admitting: Family Medicine

## 2016-01-31 ENCOUNTER — Encounter: Payer: Self-pay | Admitting: Family Medicine

## 2016-01-31 VITALS — BP 159/63 | HR 52 | Temp 96.5°F | Ht 63.0 in | Wt 105.2 lb

## 2016-01-31 DIAGNOSIS — N183 Chronic kidney disease, stage 3 unspecified: Secondary | ICD-10-CM

## 2016-01-31 DIAGNOSIS — I4891 Unspecified atrial fibrillation: Secondary | ICD-10-CM | POA: Diagnosis not present

## 2016-01-31 DIAGNOSIS — J90 Pleural effusion, not elsewhere classified: Secondary | ICD-10-CM | POA: Diagnosis not present

## 2016-01-31 DIAGNOSIS — S80812A Abrasion, left lower leg, initial encounter: Secondary | ICD-10-CM

## 2016-01-31 MED ORDER — MUPIROCIN 2 % EX OINT: 1.0000 "application " | TOPICAL_OINTMENT | Freq: Two times a day (BID) | CUTANEOUS | 0 refills | Status: DC

## 2016-01-31 NOTE — Patient Instructions (Signed)
Great to see you!  Come back in February  Please let us know if you have any concerns about breathing or cough.

## 2016-01-31 NOTE — Progress Notes (Signed)
   HPI  Patient presents today here for follow-up chronic medical conditions.  Patient states that 4 days ago she scraped her left leg when she slipped trying to get into a truck, she hit her right elbow at the same event and has a small bruise in that area. Her daughter is been keeping it clean and states that her mother want to tolerate any covering like a Band-Aid for the wound. She states that she had some transient soreness in the left calf which has improved. She denies any drainage or unusual pain in the area. She denies any fever the area.  Patient like to review recent CT scan results She was found have pole pleural effusion, changes of COPD, and changes from old lung surgery. She states that at baseline she breathes well but has to sleep sitting up, she states this is because of comfort and not only because of breathing. She states that she is able to walk around the home without dyspnea with a walker.  Patients taking all her medications well, no bleeding She has A. fib with no heart racing.  PMH: Smoking status noted ROS: Per HPI  Objective: BP (!) 159/63   Pulse (!) 52   Temp (!) 96.5 F (35.8 C) (Oral)   Ht 5\' 3"  (1.6 m)   Wt 105 lb 3.2 oz (47.7 kg)   BMI 18.64 kg/m  Gen: NAD, alert, cooperative with exam HEENT: NCAT CV: RRR, good S1/S2, no murmur Resp: CTABL, no wheezes, non-labored Ext: No edema, warm Neuro: Alert and oriented, No gross deficits Skin: 2.5 cm x 2 cm shallow abrasion on the left anterior shin with heme crusting present  MSK Area of swelling above clavicle not consistent with her child's note on follow-up. Area swelling has improved.  Assessment and plan:  # Abrasion of the anterior left lower leg No signs of active infection Given mupirocin ointment since she will not tolerate covering. Recommended use up to 4 times daily to keep the wound moist. Discussed signs of infection and recommended low threshold to return  # Pleural  effusions Bilateral pleural effusion, this could be due to underlying CHF, however she has not had a echo that I can see in her records. I discussed with the patient repeat x-ray, possible echocardiogram, and also possibly referring to pulmonology to work this up. However she is breathing well and is not very interested in further workup. Follow clinically, low threshold for repeat chest x-ray as well as pulmonology follow-up Some findings of COPD on CT scan, however clinically not very significant with normal breathing  # Stage III kidney disease Previous labs with GFR consistent with stage III kidney disease Plan to repeat labs with next A1c check in 2 months.  # Atrial fibrillation Rate controlled without beta blocker, chronically anticoagulated without request No signs of bleeding    Meds ordered this encounter  Medications  . mupirocin ointment (BACTROBAN) 2 %    Sig: Place 1 application into the nose 2 (two) times daily.    Dispense:  22 g    Refill:  [place], MD [facility] 01/31/2016, 5:08 PM

## 2016-02-04 ENCOUNTER — Other Ambulatory Visit: Payer: Self-pay | Admitting: Family Medicine

## 2016-02-04 DIAGNOSIS — I10 Essential (primary) hypertension: Secondary | ICD-10-CM

## 2016-02-16 DIAGNOSIS — M1990 Unspecified osteoarthritis, unspecified site: Secondary | ICD-10-CM | POA: Diagnosis not present

## 2016-02-16 DIAGNOSIS — R296 Repeated falls: Secondary | ICD-10-CM | POA: Diagnosis not present

## 2016-02-23 ENCOUNTER — Other Ambulatory Visit: Payer: Self-pay | Admitting: Family Medicine

## 2016-03-11 ENCOUNTER — Ambulatory Visit: Payer: Medicare Other | Admitting: Family Medicine

## 2016-03-18 DIAGNOSIS — M1990 Unspecified osteoarthritis, unspecified site: Secondary | ICD-10-CM | POA: Diagnosis not present

## 2016-03-18 DIAGNOSIS — R296 Repeated falls: Secondary | ICD-10-CM | POA: Diagnosis not present

## 2016-03-22 ENCOUNTER — Other Ambulatory Visit: Payer: Self-pay | Admitting: Family Medicine

## 2016-04-07 ENCOUNTER — Ambulatory Visit: Payer: Medicare Other | Admitting: Family Medicine

## 2016-04-07 ENCOUNTER — Other Ambulatory Visit: Payer: Self-pay | Admitting: Family Medicine

## 2016-04-07 DIAGNOSIS — R630 Anorexia: Secondary | ICD-10-CM

## 2016-04-13 DIAGNOSIS — Z0289 Encounter for other administrative examinations: Secondary | ICD-10-CM

## 2016-04-14 ENCOUNTER — Ambulatory Visit: Payer: Self-pay | Admitting: Family Medicine

## 2016-04-16 ENCOUNTER — Encounter: Payer: Self-pay | Admitting: Family Medicine

## 2016-04-16 ENCOUNTER — Ambulatory Visit (INDEPENDENT_AMBULATORY_CARE_PROVIDER_SITE_OTHER): Payer: Medicare Other | Admitting: Family Medicine

## 2016-04-16 VITALS — BP 174/80 | HR 60 | Temp 96.7°F | Ht 63.0 in | Wt 101.8 lb

## 2016-04-16 DIAGNOSIS — L97921 Non-pressure chronic ulcer of unspecified part of left lower leg limited to breakdown of skin: Secondary | ICD-10-CM

## 2016-04-16 DIAGNOSIS — E119 Type 2 diabetes mellitus without complications: Secondary | ICD-10-CM

## 2016-04-16 DIAGNOSIS — R2681 Unsteadiness on feet: Secondary | ICD-10-CM | POA: Diagnosis not present

## 2016-04-16 DIAGNOSIS — R29898 Other symptoms and signs involving the musculoskeletal system: Secondary | ICD-10-CM

## 2016-04-16 DIAGNOSIS — M7989 Other specified soft tissue disorders: Secondary | ICD-10-CM

## 2016-04-16 LAB — BAYER DCA HB A1C WAIVED: HB A1C (BAYER DCA - WAIVED): 7.6 % — ABNORMAL HIGH (ref <7.0)

## 2016-04-16 MED ORDER — HYDROCHLOROTHIAZIDE 12.5 MG PO CAPS: 12.5000 mg | ORAL_CAPSULE | Freq: Every day | ORAL | 3 refills | Status: DC

## 2016-04-16 NOTE — Progress Notes (Signed)
   HPI  Patient presents today here to follow-up for chronic medical conditions.  Diabetes Diet controlled, previously on Amaryl which was discontinued with blood sugars in the 50s.  Feet swelling of been going on for about 6-7 days, patient has toe discomfort diffusely every once in while, otherwise no pain. Has not tried elevating feet or compression stockings.  Leg wound Patient reports has been present for few months. She is not addressing it, she does not want to go to the wound care center at this time. Her daughter-in-law is present who states that if she has to she could get the patient to go to the wound care center.  They request lift chair prescription for difficulty with sit to stand.  PMH: Smoking status noted ROS: Per HPI  Objective: BP (!) 174/80   Pulse 60   Temp (!) 96.7 F (35.9 C) (Oral)   Ht _0  (1.6 m)   Wt 101 lb 12.8 oz (46.2 kg)   BMI 18.03 kg/m  Gen: NAD, alert, cooperative with exam HEENT: NCAT CV: RRR, good S1/S2, no murmur Resp: CTABL, no wheezes, non-labored Ext: No edema, warm Neuro: Alert and oriented, No gross deficits  SKin:  2 cm x 2 to half centimeter lesion on the anterior left shin with heme crusting and some hyperkeratosis.  Diabetic Foot Exam - Simple   Simple Foot Form Diabetic Foot exam was performed with the following findings:  Yes 04/16/2016  5:22 PM  Visual Inspection No deformities, no ulcerations, no other skin breakdown bilaterally:  Yes Sensation Testing Intact to touch and monofilament testing bilaterally:  Yes Pulse Check Posterior Tibialis and Dorsalis pulse intact bilaterally:  Yes Comments Thickened yellow toenails throughout      Assessment and plan:  # Diabetes Diet controlled, A1c 7.6 without medication No changes  # Left lower extremity skin ulcer Treat with good wound care to begin, with hyperkeratosis and concerned that this could be a squamous cell carcinoma, careful measurements today as  above Offered wound care referral, however she will wait for her preference for that.  # Feet swelling Unclear etiology, lungs are clear, no orthopnea. Start HCTZ at 12.5 mg, he also needs this for hypertension  # Difficulty standing, muscular deconditioning Handwritten Rx written for lift chair     Orders Placed This Encounter  Procedures  . Bayer DCA Hb A1c Waived  . CMP14+EGFR  . CBC with Differential/Platelet    Meds ordered this encounter  Medications  . hydrochlorothiazide (MICROZIDE) 12.5 MG capsule    Sig: Take 1 capsule (12.5 mg total) by mouth daily.    Dispense:  30 capsule    Refill:  [place], MD [facility] 04/16/2016, 4:15 PM

## 2016-04-16 NOTE — Patient Instructions (Signed)
Great to see you!  Come back in 3 months unless you need [place] sooner.   We will call with lab results within 1 week

## 2016-04-17 LAB — CBC WITH DIFFERENTIAL/PLATELET
BASOS ABS: 0 10*3/uL (ref 0.0–0.2)
Basos: 0 %
EOS (ABSOLUTE): 0.1 10*3/uL (ref 0.0–0.4)
Eos: 1 %
Hematocrit: 38 % (ref 34.0–46.6)
Hemoglobin: 12.6 g/dL (ref 11.1–15.9)
IMMATURE GRANS (ABS): 0 10*3/uL (ref 0.0–0.1)
Immature Granulocytes: 0 %
LYMPHS: 17 %
Lymphocytes Absolute: 1.1 10*3/uL (ref 0.7–3.1)
MCH: 29.1 pg (ref 26.6–33.0)
MCHC: 33.2 g/dL (ref 31.5–35.7)
MCV: 88 fL (ref 79–97)
Monocytes Absolute: 0.4 10*3/uL (ref 0.1–0.9)
Monocytes: 7 %
NEUTROS ABS: 4.7 10*3/uL (ref 1.4–7.0)
NEUTROS PCT: 75 %
PLATELETS: 194 10*3/uL (ref 150–379)
RBC: 4.33 x10E6/uL (ref 3.77–5.28)
RDW: 16.1 % — ABNORMAL HIGH (ref 12.3–15.4)
WBC: 6.2 10*3/uL (ref 3.4–10.8)

## 2016-04-17 LAB — CMP14+EGFR
ALK PHOS: 112 IU/L (ref 39–117)
ALT: 7 IU/L (ref 0–32)
AST: 15 IU/L (ref 0–40)
Albumin/Globulin Ratio: 1.4 (ref 1.2–2.2)
Albumin: 4.1 g/dL (ref 3.2–4.6)
BILIRUBIN TOTAL: 0.5 mg/dL (ref 0.0–1.2)
BUN/Creatinine Ratio: 17 (ref 12–28)
BUN: 16 mg/dL (ref 10–36)
CHLORIDE: 102 mmol/L (ref 96–106)
CO2: 26 mmol/L (ref 18–29)
CREATININE: 0.96 mg/dL (ref 0.57–1.00)
Calcium: 9.4 mg/dL (ref 8.7–10.3)
GFR calc Af Amer: 60 mL/min/{1.73_m2} (ref >59)
GFR calc non Af Amer: 52 mL/min/{1.73_m2} — ABNORMAL LOW (ref >59)
GLUCOSE: 211 mg/dL — AB (ref 65–99)
Globulin, Total: 2.9 g/dL (ref 1.5–4.5)
Potassium: 4 mmol/L (ref 3.5–5.2)
Sodium: 143 mmol/L (ref 134–144)
Total Protein: 7 g/dL (ref 6.0–8.5)

## 2016-04-22 ENCOUNTER — Other Ambulatory Visit: Payer: Self-pay | Admitting: Family Medicine

## 2016-05-21 ENCOUNTER — Other Ambulatory Visit: Payer: Self-pay | Admitting: Family Medicine

## 2016-05-21 DIAGNOSIS — I10 Essential (primary) hypertension: Secondary | ICD-10-CM

## 2016-05-26 ENCOUNTER — Other Ambulatory Visit: Payer: Self-pay | Admitting: Family Medicine

## 2016-06-12 ENCOUNTER — Other Ambulatory Visit: Payer: Self-pay | Admitting: Family

## 2016-06-12 DIAGNOSIS — J309 Allergic rhinitis, unspecified: Secondary | ICD-10-CM

## 2016-06-12 DIAGNOSIS — J069 Acute upper respiratory infection, unspecified: Secondary | ICD-10-CM

## 2016-07-20 ENCOUNTER — Ambulatory Visit (INDEPENDENT_AMBULATORY_CARE_PROVIDER_SITE_OTHER): Payer: Medicare Other | Admitting: Family Medicine

## 2016-07-20 ENCOUNTER — Encounter: Payer: Self-pay | Admitting: Family Medicine

## 2016-07-20 VITALS — BP 126/56 | HR 60 | Temp 97.1°F | Ht 63.0 in | Wt 97.4 lb

## 2016-07-20 DIAGNOSIS — I4891 Unspecified atrial fibrillation: Secondary | ICD-10-CM

## 2016-07-20 DIAGNOSIS — L602 Onychogryphosis: Secondary | ICD-10-CM

## 2016-07-20 DIAGNOSIS — E119 Type 2 diabetes mellitus without complications: Secondary | ICD-10-CM

## 2016-07-20 DIAGNOSIS — L89151 Pressure ulcer of sacral region, stage 1: Secondary | ICD-10-CM | POA: Diagnosis not present

## 2016-07-20 LAB — BAYER DCA HB A1C WAIVED: HB A1C (BAYER DCA - WAIVED): 7 % — ABNORMAL HIGH (ref <7.0)

## 2016-07-20 NOTE — Patient Instructions (Signed)
Great to see you!  Come back in 3 months unless you need [place] sooner.   We will see if we can get you into Dr. Tally Due clinic for your feet.   Try a donut pillow or some kind of cushion to unload the pressure over the sore. Continue the [place] sav you are using.

## 2016-07-20 NOTE — Progress Notes (Signed)
   HPI  Patient presents today here for follow-up chronic medical conditions.  Atrial fibrillation Good medication compliance with Elliquis, no bleeding. No racing heart.  Diabetes Watching diet, moderately good appetite. Difficulty swallowing in general. Patient has stopped medications as recommended.  Ulcer Has a sacral ulcer that has been calming and going for years. Patient states that it has recently healed up, and will stay healed for a few weeks and then return. Her daughter places [place] on it to help which usually talkes care of it.   PMH: Smoking status noted ROS: Per HPI  Objective: BP (!) 126/56   Pulse 60   Temp 97.1 F (36.2 C) (Oral)   Ht 5\' 3"  (1.6 m)   Wt 97 lb 6.4 oz (44.2 kg)   BMI 17.25 kg/m  Gen: NAD, alert, cooperative with exam HEENT: NCAT, EOMI, PERRL CV: RRR, good S1/S2, no murmur Resp: CTABL, no wheezes, non-labored Ext: No edema, warm Neuro: Alert and oriented, No gross deficits  Skin:  Punctate, crusted lesion meas approx 97mm in diameter just R of coccy, her dauter and my CMA [employer]) were present for exam Non tender, not bleeding or draining  Diabetic Foot Exam - Simple   Simple Foot Form Diabetic Foot exam was performed with the following findings:  Yes 07/20/2016  4:34 PM  Visual Inspection See comments:  Yes Sensation Testing Intact to touch and monofilament testing bilaterally:  Yes Pulse Check Posterior Tibialis and Dorsalis pulse intact bilaterally:  Yes Comments Bilateral feet with overgrown thickened yellow toenails      Assessment and plan:  # Type 2 diabetes A1c 7.0, controlled with diet. Foot exam as above  # Overgrown toenails Refer to podiatry with type 2 diabetes  # Atrial fibrillation No bleeding except for small amount from the ulcer mentioned below occasionally. Patient has not had any bleeding recently, ulcer has been reviewed resolved recently. Rate controlled Continue anticoagulation,  CBC every 6 months  # Decubitus ulcer Very superficial very small ulcer, however chronic waxing waning course Continue Vaseline-based ointment, recommended offloading pressure with doughnut cushion   Orders Placed This Encounter  Procedures  . Microalbumin / creatinine urine ratio  . Bayer DCA Hb A1c Waived  . Ambulatory referral to Podiatry    Referral Priority:   Routine    Referral Type:   Consultation    Referral Reason:   Specialty Services Required    Requested Specialty:   Podiatry    Number of Visits Requested:   [place], MD [facility] 07/20/2016, 4:35 PM

## 2016-07-21 ENCOUNTER — Other Ambulatory Visit: Payer: Medicare Other

## 2016-07-21 DIAGNOSIS — E119 Type 2 diabetes mellitus without complications: Secondary | ICD-10-CM | POA: Diagnosis not present

## 2016-07-23 ENCOUNTER — Encounter: Payer: Self-pay | Admitting: Family Medicine

## 2016-07-23 LAB — MICROALBUMIN / CREATININE URINE RATIO
Creatinine, Urine: 65.3 mg/dL
Microalb/Creat Ratio: 8.7 mg/g creat (ref 0.0–30.0)
Microalbumin, Urine: 5.7 ug/mL

## 2016-07-27 ENCOUNTER — Other Ambulatory Visit: Payer: Self-pay | Admitting: Family Medicine

## 2016-08-10 ENCOUNTER — Other Ambulatory Visit: Payer: Self-pay | Admitting: Family Medicine

## 2016-08-10 DIAGNOSIS — I10 Essential (primary) hypertension: Secondary | ICD-10-CM

## 2016-08-18 DIAGNOSIS — L84 Corns and callosities: Secondary | ICD-10-CM | POA: Diagnosis not present

## 2016-08-18 DIAGNOSIS — E1151 Type 2 diabetes mellitus with diabetic peripheral angiopathy without gangrene: Secondary | ICD-10-CM | POA: Diagnosis not present

## 2016-08-18 DIAGNOSIS — B351 Tinea unguium: Secondary | ICD-10-CM | POA: Diagnosis not present

## 2016-08-18 DIAGNOSIS — M79676 Pain in unspecified toe(s): Secondary | ICD-10-CM | POA: Diagnosis not present

## 2016-09-15 ENCOUNTER — Telehealth: Payer: Self-pay | Admitting: *Deleted

## 2016-09-15 MED ORDER — OLMESARTAN MEDOXOMIL 20 MG PO TABS: 20.0000 mg | ORAL_TABLET | Freq: Every day | ORAL | 3 refills | Status: DC

## 2016-09-15 NOTE — Telephone Encounter (Signed)
Pt is on valsartan 160 mg  Request new Rx for alternative  Please advise

## 2016-09-15 NOTE — Telephone Encounter (Signed)
Car sent to replace diovan with recall.   160 valsartan, replaced with 20 olmesartan  Laroy Apple, MD [facility] 09/15/2016, 10:11 AM

## 2016-09-15 NOTE — Telephone Encounter (Signed)
Left detailed message stating new medication sent into pharmacy to replace her medication that had been recalled and to call back with any further questions or concerns.

## 2016-09-15 NOTE — Addendum Note (Signed)
Addended by: Timmothy Euler on: 09/15/2016 10:11 AM   Modules accepted: Orders

## 2016-10-15 ENCOUNTER — Other Ambulatory Visit: Payer: Self-pay | Admitting: Family Medicine

## 2016-10-15 DIAGNOSIS — R630 Anorexia: Secondary | ICD-10-CM

## 2016-10-17 ENCOUNTER — Ambulatory Visit (INDEPENDENT_AMBULATORY_CARE_PROVIDER_SITE_OTHER): Payer: Medicare Other | Admitting: Family Medicine

## 2016-10-17 VITALS — BP 143/62 | HR 61 | Temp 96.8°F | Ht 63.0 in | Wt 96.0 lb

## 2016-10-17 DIAGNOSIS — L97521 Non-pressure chronic ulcer of other part of left foot limited to breakdown of skin: Secondary | ICD-10-CM | POA: Diagnosis not present

## 2016-10-17 MED ORDER — CEPHALEXIN 500 MG PO CAPS: 500.0000 mg | ORAL_CAPSULE | Freq: Three times a day (TID) | ORAL | 0 refills | Status: DC

## 2016-10-17 MED ORDER — CEPHALEXIN 250 MG PO CAPS: 250.0000 mg | ORAL_CAPSULE | Freq: Three times a day (TID) | ORAL | 0 refills | Status: DC

## 2016-10-17 MED ORDER — MUPIROCIN 2 % EX OINT: 1.0000 "application " | TOPICAL_OINTMENT | Freq: Two times a day (BID) | CUTANEOUS | 0 refills | Status: DC

## 2016-10-17 NOTE — Progress Notes (Signed)
   HPI  Patient presents today here with a new lesion of the left second toe.  Patient states that about a month ago she bumped her left second toe on a table causing a red area. About 2 days ago the redness worsened and she had an ulcer develop. She denies pain, fever, chills, sweats, or swelling of the area.  Her daughter is concerned because the ulcer developed 2 days ago after the area of redness was there for about a month.  She does have some forefoot pain when she walks, this is likely due to a forefoot callus  PMH: Smoking status noted ROS: Per HPI  Objective: BP (!) 143/62   Pulse 61   Temp (!) 96.8 F (36 C) (Oral)   Ht 5\' 3"  (1.6 m)   Wt 96 lb (43.5 kg)   BMI 17.01 kg/m  Gen: NAD, alert, cooperative with exam HEENT: NCAT CV: RRR, good S1/S2 Resp: CTABL, no wheezes, non-labored Ext: No edema, warm Neuro: Alert and conversational, wheelchair-bound Skin Left second toe with area of erythema measuring 2.0 cm x 1.7 cm with a 4 mm in diameter circular ulceration centrally located with small amount of yellow crusting, no drainage, warmth, tenderness to palpation  Assessment and plan:  # Ulcer left second toe Given development over the last couple of days I will go ahead and cover with Keflex, renally dosed for creatinine clearance 26. Mupirocin ointment Patient has follow-up scheduled with podiatry, Dr. Irving Shows, coming up in a few weeks, I have advised her to follow-up with me in 2-4 weeks if she is unable to see him for any reason.  Reflexes doses of 500 mg initially sent, this was corrected 250 mg after considering creatinine clearance of 26, we have updated the pharmacy with a phone call.  \ Meds ordered this encounter  Medications  . DISCONTD: cephALEXin (KEFLEX) 500 MG capsule    Sig: Take 1 capsule (500 mg total) by mouth 3 (three) times daily.    Dispense:  21 capsule    Refill:  0  . mupirocin ointment (BACTROBAN) 2 %    Sig: Apply 1 application topically 2  (two) times daily.    Dispense:  22 g    Refill:  0  . cephALEXin (KEFLEX) 250 MG capsule    Sig: Take 1 capsule (250 mg total) by mouth 3 (three) times daily.    Dispense:  21 capsule    Refill:  0    Please DC 500 mg keflex, renal dosing should be 250 TID    Laroy Apple, MD Tristan Schroeder [facility] 10/17/2016, 10:36 AM

## 2016-10-17 NOTE — Patient Instructions (Addendum)
Great to see you!  Take Keflex for 7 days  Please come back in 2-4 weeks for follow up of this ulcer ( it is ok if you are already seeing Dr. Irving Shows, you can see him instead)

## 2016-10-21 LAB — HM DIABETES EYE EXAM

## 2016-10-25 ENCOUNTER — Other Ambulatory Visit: Payer: Self-pay | Admitting: Family Medicine

## 2016-10-28 DIAGNOSIS — H353233 Exudative age-related macular degeneration, bilateral, with inactive scar: Secondary | ICD-10-CM | POA: Diagnosis not present

## 2016-10-28 DIAGNOSIS — E119 Type 2 diabetes mellitus without complications: Secondary | ICD-10-CM | POA: Diagnosis not present

## 2016-10-28 DIAGNOSIS — H04123 Dry eye syndrome of bilateral lacrimal glands: Secondary | ICD-10-CM | POA: Diagnosis not present

## 2016-10-28 DIAGNOSIS — H401133 Primary open-angle glaucoma, bilateral, severe stage: Secondary | ICD-10-CM | POA: Diagnosis not present

## 2016-10-30 ENCOUNTER — Encounter: Payer: Self-pay | Admitting: *Deleted

## 2016-11-03 DIAGNOSIS — L84 Corns and callosities: Secondary | ICD-10-CM | POA: Diagnosis not present

## 2016-11-03 DIAGNOSIS — B351 Tinea unguium: Secondary | ICD-10-CM | POA: Diagnosis not present

## 2016-11-03 DIAGNOSIS — E1142 Type 2 diabetes mellitus with diabetic polyneuropathy: Secondary | ICD-10-CM | POA: Diagnosis not present

## 2016-11-13 ENCOUNTER — Other Ambulatory Visit: Payer: Self-pay | Admitting: Family Medicine

## 2016-11-13 DIAGNOSIS — R630 Anorexia: Secondary | ICD-10-CM

## 2016-11-13 DIAGNOSIS — I10 Essential (primary) hypertension: Secondary | ICD-10-CM

## 2017-01-06 ENCOUNTER — Ambulatory Visit (INDEPENDENT_AMBULATORY_CARE_PROVIDER_SITE_OTHER): Payer: Medicare Other | Admitting: *Deleted

## 2017-01-06 ENCOUNTER — Encounter: Payer: Self-pay | Admitting: *Deleted

## 2017-01-06 VITALS — BP 122/66 | HR 45

## 2017-01-06 DIAGNOSIS — Z0000 Encounter for general adult medical examination without abnormal findings: Secondary | ICD-10-CM

## 2017-01-06 DIAGNOSIS — Z23 Encounter for immunization: Secondary | ICD-10-CM

## 2017-01-06 DIAGNOSIS — J309 Allergic rhinitis, unspecified: Secondary | ICD-10-CM

## 2017-01-06 DIAGNOSIS — J069 Acute upper respiratory infection, unspecified: Secondary | ICD-10-CM

## 2017-01-06 MED ORDER — OLMESARTAN MEDOXOMIL 20 MG PO TABS: 20.0000 mg | ORAL_TABLET | Freq: Every day | ORAL | 3 refills | Status: DC

## 2017-01-06 MED ORDER — FLUTICASONE PROPIONATE 50 MCG/ACT NA SUSP: NASAL | 6 refills | Status: DC

## 2017-01-06 NOTE — Patient Instructions (Signed)
  Ms. Mckelvin , Thank you for taking time to come for your Medicare Wellness Visit. I appreciate your ongoing commitment to your health goals. Please review the following plan we discussed and let me know if I can assist you in the future.   These are the goals we discussed: Goals    . Exercise 3x per week (30 min per time)     Chair exercises with assistance daily. Refer to handout. .    . Gain weight     Goal weight is 115 lbs.  I have given you a prescription for Boost for diabetics that might help with nutrition and to increase weight       This is a list of the screening recommended for you and due dates:  Health Maintenance  Topic Date Due  . Pneumonia vaccines (2 of 2 - PPSV23) 11/02/2014  . Flu Shot  09/09/2016  . Eye exam for diabetics  09/27/2016  . Hemoglobin A1C  01/19/2017  . Complete foot exam   07/20/2017  . Tetanus Vaccine  05/20/2022  . DEXA scan (bone density measurement)  Completed

## 2017-01-13 NOTE — Progress Notes (Signed)
Subjective:   Stacey Brown is a 83 y.o. female who presents for a subsequent Medicare Annual Wellness Visit. She lives with her daughter and grandson and her niece helps her while her daughter is at work.   Review of Systems    Health is about the same as last year.   Cardiac Risk Factors include: advanced age (>52mn, >>78women);sedentary lifestyle;hypertension;diabetes mellitus;microalbuminuria;dyslipidemia     Objective:    Today's Vitals   01/06/17 1509  BP: 122/66  Pulse: (!) 45  Wheelchair There is no height or weight on file to calculate BMI.  Advanced Directives 01/06/2017 12/30/2015 06/01/2014  Does Patient Have a Medical Advance Directive? Yes Yes Yes  Type of [place] Does patient want to make changes to medical advance directive? No - Patient declined - No - Patient declined  Copy of [place] Chart? No - copy requested No - copy requested No - copy requested    Current Medications (verified) Outpatient Encounter Medications as of 01/06/2017  Medication Sig  . amLODipine (NORVASC) 5 MG tablet Take 1 Tablet by mouth once daily  . BYSTOLIC 5 MG tablet Take 1 Tablet by mouth once daily  . carboxymethylcellulose (REFRESH PLUS) 0.5 % SOLN 1 drop 3 (three) times daily as needed.  . cephALEXin (KEFLEX) 250 MG capsule Take 1 capsule (250 mg total) by mouth 3 (three) times daily.  . cromolyn (OPTICROM) 4 % ophthalmic solution   . ELIQUIS 2.5 MG TABS tablet Take 1 Tablet by mouth 2 times a day  . fluticasone (FLONASE) 50 MCG/ACT nasal spray USE TWO SPRAY(S) IN EACH NOSTRIL ONCE DAILY  . hydrochlorothiazide (MICROZIDE) 12.5 MG capsule Take 1 Capsule by mouth once daily  . LINZESS 290 MCG CAPS capsule Take 1 Capsule by mouth once daily  . Menthol-Zinc Oxide (CALMOSEPTINE) 0.44-20.6 % OINT Use BID on bottom and as needed  . mirtazapine  (REMERON) 7.5 MG tablet Take 1 Tablet by mouth at bedtime  . Misc. Devices (RAISED TOILET SEAT) MISC Use as directed.  Dx:  High fall risk R 29.6 and osteoporosis M 81.0  . mupirocin ointment (BACTROBAN) 2 % Apply 1 application topically 2 (two) times daily.  .Marland Kitchenolmesartan (BENICAR) 20 MG tablet Take 1 tablet (20 mg total) by mouth daily.  . polyethylene glycol powder (GLYCOLAX/MIRALAX) powder Take 17 g by mouth daily.  .Marland KitchenSTARCH-MALTO DEXTRIN (THICK-IT) POWD Take by mouth.  . travoprost, benzalkonium, (TRAVATAN) 0.004 % ophthalmic solution Place 1 drop into both eyes at bedtime.  .Marland Kitchentrimethoprim-polymyxin b (POLYTRIM) ophthalmic solution Place 1 drop into both eyes every 6 (six) hours.  . [DISCONTINUED] fluticasone (FLONASE) 50 MCG/ACT nasal spray USE TWO SPRAY(S) IN EACH NOSTRIL ONCE DAILY  . [DISCONTINUED] olmesartan (BENICAR) 20 MG tablet Take 1 tablet (20 mg total) by mouth daily.  .Marland KitchenACCU-CHEK AVIVA PLUS test strip USE UP TO 3 TIMES A DAY AS DIRECTED. (Patient not taking: Reported on 01/06/2017)  . blood glucose meter kit and supplies Dispense based on patient and insurance preference. Use up to four times daily as directed. (FOR ICD-9 250.00, 250.01). (Patient not taking: Reported on 01/06/2017)  . [DISCONTINUED] TRAVATAN Z 0.004 % SOLN ophthalmic solution    No facility-administered encounter medications on file as of 01/06/2017.     Allergies (verified) Dexilant [dexlansoprazole] and Ace inhibitors   History: Past Medical History:  Diagnosis Date  . Abnormal liver  function test   . Allergy    allergic  rhinitis  . Arthritis   . ASCVD (arteriosclerotic cardiovascular disease)   . Atrial fibrillation ([place])   . Cataract   . Chronic anticoagulation   . Chronic kidney disease   . Diabetes mellitus without complication ([place])   . Esophageal stricture   . GERD (gastroesophageal reflux disease)   . Glaucoma   . H/O TB (tuberculosis)   . History of DVT of lower extremity   .  Hyperlipidemia   . Macular degeneration   . Osteoporosis   . Primary biliary cirrhosis ([place])   . Shingles   . Swallowing difficulty    Past Surgical History:  Procedure Laterality Date  . ABDOMINAL HYSTERECTOMY    . CHOLECYSTECTOMY    . HERNIA REPAIR    . Herniography    . Left breast biopsy    . LIVER BIOPSY    . MINOR HEMORRHOIDECTOMY    . Right hip replacement    . Right lung biopsy     Family History  Problem Relation Age of Onset  . Breast cancer Other        Neice  . Heart disease Mother   . Diabetes Mother        Brother,  . Heart disease Brother   . Kidney disease Cousin   . Cancer Father   . Osteoporosis Sister   . Hyperlipidemia Daughter   . Diabetes Daughter   . Diabetes Brother   . Thyroid disease Brother   . Cirrhosis Other        Nephew  . Colon cancer Neg Hx    Social History   Socioeconomic History  . Marital status: Widowed    Spouse name: Not on file  . Number of children: 1  . Years of education: Not on file  . Highest education level: Not on file  Social Needs  . Financial resource strain: Not hard at all  . Food insecurity - worry: Never true  . Food insecurity - inability: Never true  . Transportation needs - medical: No  . Transportation needs - non-medical: No  Occupational History  . Occupation: Retired    Comment: Disabled  Tobacco Use  . Smoking status: Never Smoker  . Smokeless tobacco: Never Used  Substance and Sexual Activity  . Alcohol use: No  . Drug use: No  . Sexual activity: No  Other Topics Concern  . Not on file  Social History Narrative   Daily caffeine Use     Activities of Daily Living In your present state of health, do you have any difficulty performing the following activities: 01/06/2017  Hearing? Y  Vision? Y  Comment recent eye exam. Blind in left eye  Difficulty concentrating or making decisions? Y  Comment Some difficulty with short term memory  Walking or climbing stairs? N  Dressing or  bathing? Y  Doing errands, shopping? Y  Preparing Food and eating ? Y  Using the Toilet? Y  In the past six months, have you accidently leaked urine? Y  Do you have problems with loss of bowel control? N  Managing your Medications? Y  Comment daughter organizes them in a pillbox  Managing your Finances? Y  Comment daughter Holiday representative or managing your Housekeeping? Y  Some recent data might be hidden     Immunizations and Health Maintenance Immunization History  Administered Date(s) Administered  . Influenza, High Dose Seasonal PF 12/06/2015  . Influenza,inj,Quad PF,6+ Mos  11/03/2012  . Pneumococcal Conjugate-13 11/01/2013  . Tdap 05/19/2012   Health Maintenance Due  Topic Date Due  . PNA vac Low Risk Adult (2 of 2 - PPSV23) 11/02/2014  . INFLUENZA VACCINE  09/09/2016  . OPHTHALMOLOGY EXAM  09/27/2016    Patient Care Team: Timmothy Euler, MD as PCP - General (Family Medicine)  No surgeries, hospitalizations, ER visits this past year.      Assessment:   This is a routine wellness examination for [facility].   Hearing/Vision screen No deficits noted during visit. Patient reports being blind in the left eye and has had a recent eye exam.   Dietary issues and exercise activities discussed:    Goals    . Exercise 3x per week (30 min per time)     Chair exercises with assistance daily. Refer to handout. .    . Gain weight     Goal weight is 115 lbs.  I have given you a prescription for Boost for diabetics that might help with nutrition and to increase weight      Depression Screen PHQ 2/9 Scores 01/06/2017 10/17/2016 07/20/2016 04/16/2016 01/31/2016 12/30/2015 12/06/2015  PHQ - 2 Score 0 0 0 0 0 0 0    Fall Risk Fall Risk  01/06/2017 10/17/2016 07/20/2016 04/16/2016 01/31/2016  Falls in the past year? No No No No No  Number falls in past yr: - - - - -  Injury with Fall? - - - - -    Cognitive Function: MMSE - Mini Mental State Exam 06/01/2014    Orientation to time 0  Orientation to Place 2  Registration 3  Attention/ Calculation 5  Recall 1  Language- name 2 objects 2  Language- repeat 1  Language- follow 3 step command 3  Language- read & follow direction 1  Write a sentence 0  Copy design 0  Total score 18  Abnormal exam   6CIT Screen 12/30/2015  What Year? 4 points  What month? 0 points  What time? 0 points  Count back from 20 4 points  Months in reverse 4 points  Repeat phrase 10 points  Total Score 22    Screening Tests Health Maintenance  Topic Date Due  . PNA vac Low Risk Adult (2 of 2 - PPSV23) 11/02/2014  . INFLUENZA VACCINE  09/09/2016  . OPHTHALMOLOGY EXAM  09/27/2016  . HEMOGLOBIN A1C  01/19/2017  . FOOT EXAM  07/20/2017  . TETANUS/TDAP  05/20/2022  . DEXA SCAN  Completed    Plan:  Keep f/u with PCP Flu shot given today Check on the cost of td at next visit Move carefully to avoid falls  I have personally reviewed and noted the following in the patient's chart:   . Medical and social history . Use of alcohol, tobacco or illicit drugs  . Current medications and supplements . Functional ability and status . Nutritional status . Physical activity . Advanced directives . List of other physicians . Hospitalizations, surgeries, and ER visits in previous 12 months . Vitals . Screenings to include cognitive, depression, and falls . Referrals and appointments  In addition, I have reviewed and discussed with patient certain preventive protocols, quality metrics, and best practice recommendations. A written personalized care plan for preventive services as well as general preventive health recommendations were provided to patient.     Chong Sicilian, RN   01/06/2017   I have reviewed and agree with the above AWV documentation.   Laroy Apple, MD Tr[facility] Schroeder [facility] 01/14/2017, 12:03 PM

## 2017-01-20 ENCOUNTER — Other Ambulatory Visit: Payer: Self-pay

## 2017-01-20 DIAGNOSIS — R630 Anorexia: Secondary | ICD-10-CM

## 2017-01-20 MED ORDER — MIRTAZAPINE 7.5 MG PO TABS: 7.5000 mg | ORAL_TABLET | Freq: Every day | ORAL | 1 refills | Status: DC

## 2017-01-27 ENCOUNTER — Other Ambulatory Visit: Payer: Self-pay | Admitting: *Deleted

## 2017-01-27 MED ORDER — APIXABAN 2.5 MG PO TABS: 2.5000 mg | ORAL_TABLET | Freq: Two times a day (BID) | ORAL | 0 refills | Status: DC

## 2017-01-27 MED ORDER — LINACLOTIDE 290 MCG PO CAPS: 290.0000 ug | ORAL_CAPSULE | Freq: Every day | ORAL | 0 refills | Status: DC

## 2017-02-04 ENCOUNTER — Other Ambulatory Visit: Payer: Self-pay | Admitting: *Deleted

## 2017-02-04 MED ORDER — APIXABAN 2.5 MG PO TABS: 2.5000 mg | ORAL_TABLET | Freq: Two times a day (BID) | ORAL | 0 refills | Status: DC

## 2017-02-04 MED ORDER — LINACLOTIDE 290 MCG PO CAPS: 290.0000 ug | ORAL_CAPSULE | Freq: Every day | ORAL | 0 refills | Status: DC

## 2017-02-05 ENCOUNTER — Other Ambulatory Visit: Payer: Self-pay

## 2017-02-05 DIAGNOSIS — I10 Essential (primary) hypertension: Secondary | ICD-10-CM

## 2017-02-05 MED ORDER — HYDROCHLOROTHIAZIDE 12.5 MG PO CAPS: 12.5000 mg | ORAL_CAPSULE | Freq: Every day | ORAL | 0 refills | Status: DC

## 2017-02-05 MED ORDER — NEBIVOLOL HCL 5 MG PO TABS: 5.0000 mg | ORAL_TABLET | Freq: Every day | ORAL | 0 refills | Status: DC

## 2017-02-26 ENCOUNTER — Ambulatory Visit (INDEPENDENT_AMBULATORY_CARE_PROVIDER_SITE_OTHER): Payer: Medicare Other | Admitting: Family Medicine

## 2017-02-26 ENCOUNTER — Encounter: Payer: Self-pay | Admitting: Family Medicine

## 2017-02-26 VITALS — BP 158/55 | HR 65 | Temp 97.3°F

## 2017-02-26 DIAGNOSIS — L304 Erythema intertrigo: Secondary | ICD-10-CM

## 2017-02-26 DIAGNOSIS — I878 Other specified disorders of veins: Secondary | ICD-10-CM

## 2017-02-26 DIAGNOSIS — I10 Essential (primary) hypertension: Secondary | ICD-10-CM | POA: Diagnosis not present

## 2017-02-26 DIAGNOSIS — Z0000 Encounter for general adult medical examination without abnormal findings: Secondary | ICD-10-CM

## 2017-02-26 DIAGNOSIS — E119 Type 2 diabetes mellitus without complications: Secondary | ICD-10-CM

## 2017-02-26 LAB — BAYER DCA HB A1C WAIVED: HB A1C (BAYER DCA - WAIVED): 7 % — ABNORMAL HIGH (ref <7.0)

## 2017-02-26 MED ORDER — NYSTATIN 100000 UNIT/GM EX CREA: 1.0000 "application " | TOPICAL_CREAM | Freq: Two times a day (BID) | CUTANEOUS | 2 refills | Status: AC

## 2017-02-26 NOTE — Progress Notes (Signed)
   HPI  Patient presents today here for an annual physical exam and to follow-up for chronic medical conditions.  Patient feels well and has no complaints except for persistent left second toe ulcer.  She has follow-up with podiatry scheduled. She feels like it slightly better.  States that occasionally she has swelling of the bilateral lower extremities is better by morning. No discomfort.  Patient also states that occasionally she has a rash under her bilateral breasts that gets better intermittently and then returns.  She has been eating good and states that she is watching her diet moderately as it pertains diabetes. Not checking blood sugars  PMH: Smoking status noted ROS: Per HPI  Objective: BP (!) 158/55   Pulse 65   Temp (!) 97.3 F (36.3 C) (Axillary)  Gen: NAD, alert, cooperative with exam HEENT: NCAT, PERRLA CV: RRR, good S1/S2, no murmur Resp: CTABL, no wheezes, non-labored Abd: SNTND, BS present, no guarding or organomegaly Ext: No edema, warm Neuro: Alert and oriented, No gross deficits Skin:  Small erythematous patch on the left lower quadrant as well as the right upper breast, no visible lesions under the breasts bilaterally today Left second toe with 8 mm x 5 mm erosion which appears to be affecting only the skin.,  Surrounding erythema that is nontender, no drainage  Assessment and plan:  #Annual physical exam Normal exam except for below issues Labs today Cholesterol testing deferred  #Hypertension Reasonable goal for 84 year old female. No changes in medications  #Intertrigo Nystatin as needed, also likely some eczema patches which were discussed, may try emollient or lotion for those  #Venous stasis Reassurance provided, compression stockings if needed None present today  Type 2 diabetes Previously well controlled, medication was stopped A1c pending, expect good control   Orders Placed This Encounter  Procedures  . CMP14+EGFR  . CBC  with Differential/Platelet  . Bayer DCA Hb A1c Waived    Meds ordered this encounter  Medications  . nystatin cream (MYCOSTATIN)    Sig: Apply 1 application topically 2 (two) times daily.    Dispense:  30 g    Refill:  [place], MD [facility] 02/26/2017, 2:35 PM

## 2017-02-26 NOTE — Patient Instructions (Addendum)
Great to see you!  Try nystatin cream for your rash.  Back in 4 months for follow-up

## 2017-02-27 LAB — CBC WITH DIFFERENTIAL/PLATELET
BASOS ABS: 0 10*3/uL (ref 0.0–0.2)
Basos: 0 %
EOS (ABSOLUTE): 0.1 10*3/uL (ref 0.0–0.4)
Eos: 1 %
HEMOGLOBIN: 12.6 g/dL (ref 11.1–15.9)
Hematocrit: 37.7 % (ref 34.0–46.6)
IMMATURE GRANS (ABS): 0 10*3/uL (ref 0.0–0.1)
IMMATURE GRANULOCYTES: 0 %
LYMPHS: 20 %
Lymphocytes Absolute: 1.3 10*3/uL (ref 0.7–3.1)
MCH: 30.6 pg (ref 26.6–33.0)
MCHC: 33.4 g/dL (ref 31.5–35.7)
MCV: 92 fL (ref 79–97)
MONOCYTES: 6 %
Monocytes Absolute: 0.4 10*3/uL (ref 0.1–0.9)
NEUTROS PCT: 73 %
Neutrophils Absolute: 4.5 10*3/uL (ref 1.4–7.0)
PLATELETS: 181 10*3/uL (ref 150–379)
RBC: 4.12 x10E6/uL (ref 3.77–5.28)
RDW: 14.9 % (ref 12.3–15.4)
WBC: 6.3 10*3/uL (ref 3.4–10.8)

## 2017-02-27 LAB — CMP14+EGFR
A/G RATIO: 1.5 (ref 1.2–2.2)
ALT: 9 IU/L (ref 0–32)
AST: 16 IU/L (ref 0–40)
Albumin: 4.1 g/dL (ref 3.2–4.6)
Alkaline Phosphatase: 92 IU/L (ref 39–117)
BILIRUBIN TOTAL: 0.4 mg/dL (ref 0.0–1.2)
BUN/Creatinine Ratio: 26 (ref 12–28)
BUN: 28 mg/dL (ref 10–36)
CALCIUM: 9.5 mg/dL (ref 8.7–10.3)
CHLORIDE: 105 mmol/L (ref 96–106)
CO2: 20 mmol/L (ref 20–29)
Creatinine, Ser: 1.06 mg/dL — ABNORMAL HIGH (ref 0.57–1.00)
GFR calc Af Amer: 53 mL/min/{1.73_m2} — ABNORMAL LOW (ref >59)
GFR, EST NON AFRICAN AMERICAN: 46 mL/min/{1.73_m2} — AB (ref >59)
GLUCOSE: 176 mg/dL — AB (ref 65–99)
Globulin, Total: 2.8 g/dL (ref 1.5–4.5)
POTASSIUM: 4.1 mmol/L (ref 3.5–5.2)
Sodium: 144 mmol/L (ref 134–144)
TOTAL PROTEIN: 6.9 g/dL (ref 6.0–8.5)

## 2017-03-01 ENCOUNTER — Telehealth: Payer: Self-pay | Admitting: Family Medicine

## 2017-03-01 NOTE — Telephone Encounter (Signed)
error 

## 2017-03-02 NOTE — Telephone Encounter (Signed)
I am not aware of any OTC appetite stimulants that I would recommend.   Laroy Apple, MD [facility] 03/02/2017, 8:34 AM

## 2017-03-30 ENCOUNTER — Other Ambulatory Visit: Payer: Self-pay

## 2017-03-30 DIAGNOSIS — I10 Essential (primary) hypertension: Secondary | ICD-10-CM

## 2017-03-30 MED ORDER — NEBIVOLOL HCL 5 MG PO TABS: 5.0000 mg | ORAL_TABLET | Freq: Every day | ORAL | 1 refills | Status: DC

## 2017-03-30 MED ORDER — APIXABAN 2.5 MG PO TABS: 2.5000 mg | ORAL_TABLET | Freq: Two times a day (BID) | ORAL | 1 refills | Status: DC

## 2017-03-30 MED ORDER — OLMESARTAN MEDOXOMIL 20 MG PO TABS: 20.0000 mg | ORAL_TABLET | Freq: Every day | ORAL | 1 refills | Status: DC

## 2017-03-30 MED ORDER — POLYETHYLENE GLYCOL 3350 17 GM/SCOOP PO POWD: 17.0000 g | Freq: Every day | ORAL | 1 refills | Status: DC

## 2017-03-30 MED ORDER — AMLODIPINE BESYLATE 5 MG PO TABS: 5.0000 mg | ORAL_TABLET | Freq: Every day | ORAL | 1 refills | Status: DC

## 2017-03-30 MED ORDER — HYDROCHLOROTHIAZIDE 12.5 MG PO CAPS: 12.5000 mg | ORAL_CAPSULE | Freq: Every day | ORAL | 1 refills | Status: DC

## 2017-04-06 DIAGNOSIS — M79676 Pain in unspecified toe(s): Secondary | ICD-10-CM | POA: Diagnosis not present

## 2017-04-06 DIAGNOSIS — L84 Corns and callosities: Secondary | ICD-10-CM | POA: Diagnosis not present

## 2017-04-06 DIAGNOSIS — I70203 Unspecified atherosclerosis of native arteries of extremities, bilateral legs: Secondary | ICD-10-CM | POA: Diagnosis not present

## 2017-04-06 DIAGNOSIS — B351 Tinea unguium: Secondary | ICD-10-CM | POA: Diagnosis not present

## 2017-04-14 DIAGNOSIS — Z0289 Encounter for other administrative examinations: Secondary | ICD-10-CM

## 2017-04-25 ENCOUNTER — Other Ambulatory Visit: Payer: Self-pay | Admitting: Family Medicine

## 2017-04-25 IMAGING — DX DG CHEST 2V
2 series · 2 of 2 positions shown · non-contrast
Comparison: 11/03/2014

CLINICAL DATA: For cause node question occult malignancy, history
atrial fibrillation, primary biliary cirrhosis, anterior sclerotic
cardiovascular disease, diabetes mellitus, GERD, TB

EXAM:
CHEST  2 VIEW

[chest pa]
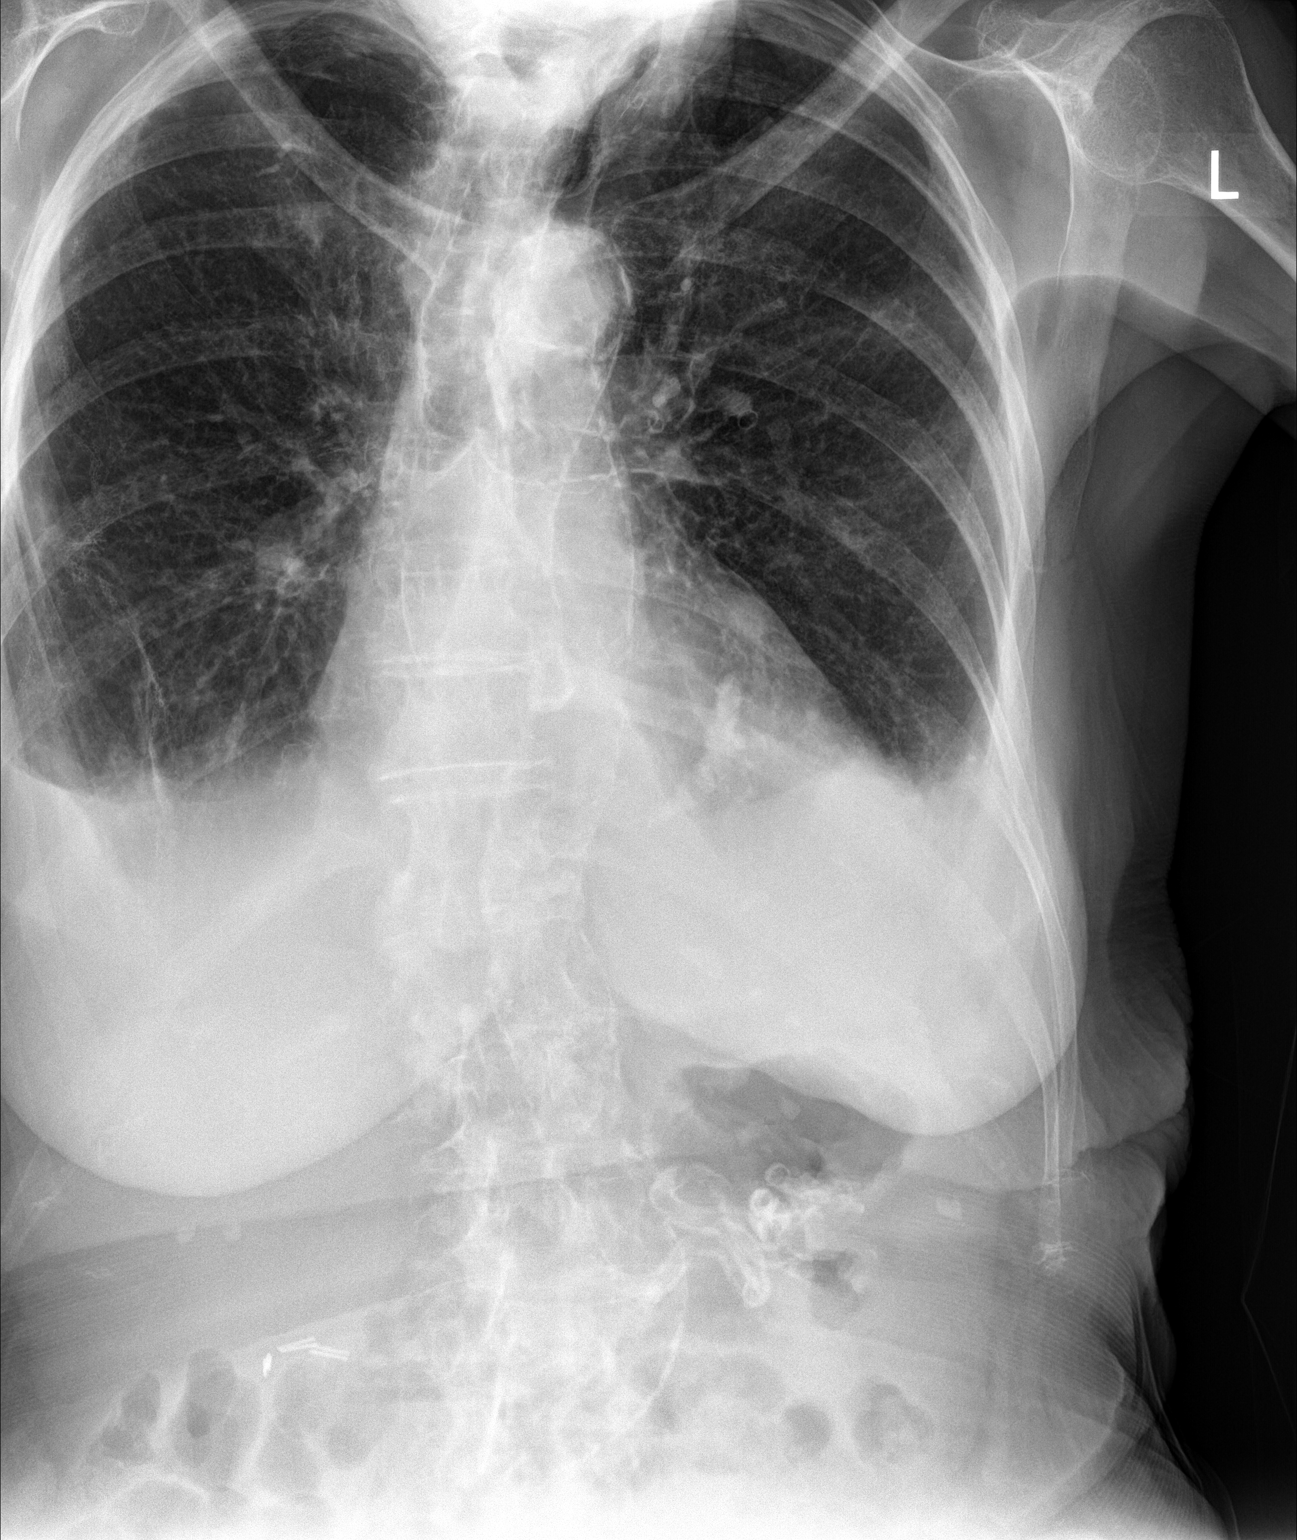

[chest lat]
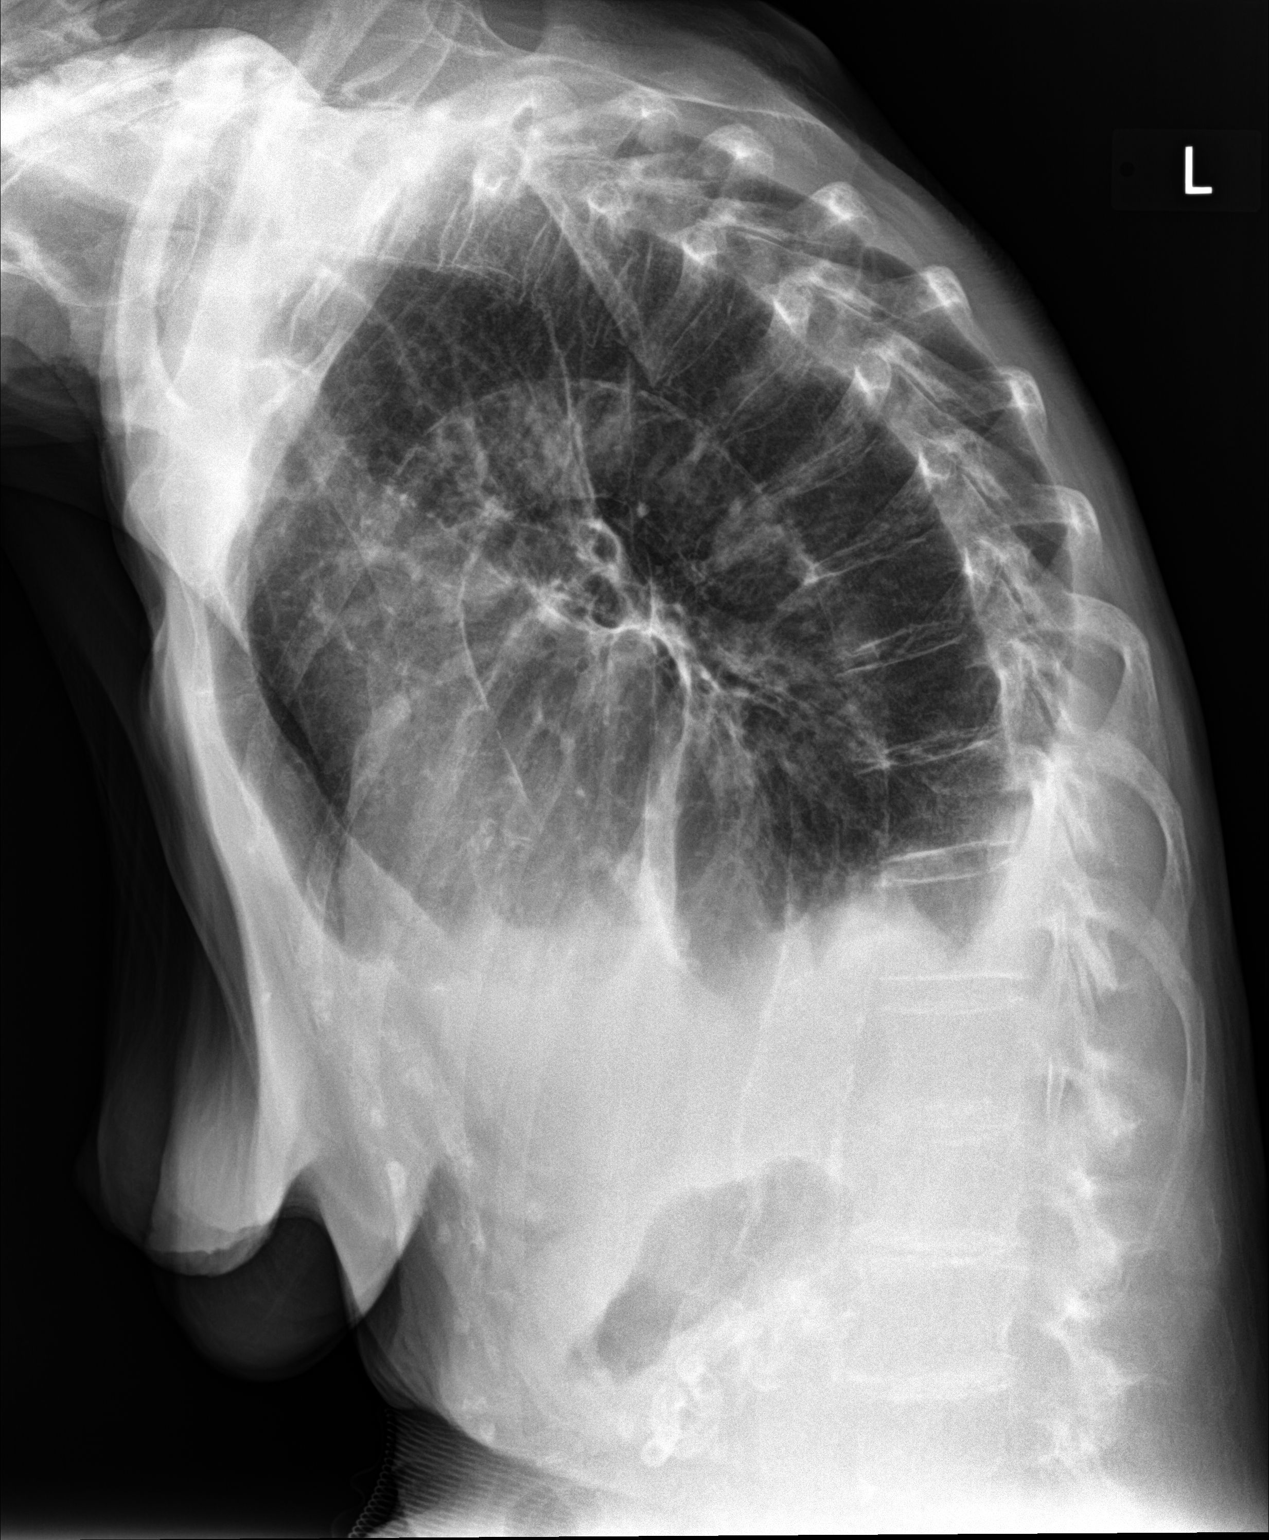

[2 of 2 positions shown; findings below may reference images not displayed]

FINDINGS: Normal heart size with mitral annular calcification again seen.

Atherosclerotic calcification aorta.

Mediastinal contours and pulmonary vascularity normal.

Rotated to the LEFT.

Emphysematous and bronchitic changes consistent with COPD.

Bibasilar pleural effusions and minimal atelectasis.

Staple lines from prior lung resections at the mid and lower RIGHT
chest.

No definite infiltrate or pneumothorax.

Bones appear demineralized with thoracolumbar scoliosis.
IMPRESSION: Changes of COPD and prior RIGHT lung surgery with new bibasilar
pleural effusions and atelectasis.

Aortic atherosclerosis.

## 2017-04-30 ENCOUNTER — Other Ambulatory Visit: Payer: Self-pay | Admitting: *Deleted

## 2017-04-30 MED ORDER — OLMESARTAN MEDOXOMIL 20 MG PO TABS: 20.0000 mg | ORAL_TABLET | Freq: Every day | ORAL | 0 refills | Status: DC

## 2017-06-28 ENCOUNTER — Ambulatory Visit (INDEPENDENT_AMBULATORY_CARE_PROVIDER_SITE_OTHER): Payer: Medicare Other | Admitting: Family Medicine

## 2017-06-28 ENCOUNTER — Encounter: Payer: Self-pay | Admitting: Family Medicine

## 2017-06-28 VITALS — BP 126/53 | HR 59 | Temp 97.2°F

## 2017-06-28 DIAGNOSIS — E119 Type 2 diabetes mellitus without complications: Secondary | ICD-10-CM

## 2017-06-28 DIAGNOSIS — I4891 Unspecified atrial fibrillation: Secondary | ICD-10-CM | POA: Diagnosis not present

## 2017-06-28 DIAGNOSIS — J302 Other seasonal allergic rhinitis: Secondary | ICD-10-CM

## 2017-06-28 LAB — BAYER DCA HB A1C WAIVED: HB A1C (BAYER DCA - WAIVED): 6.5 % (ref <7.0)

## 2017-06-28 NOTE — Patient Instructions (Signed)
Great to see you!  Try 1 zyrtec daily ( 10 mg, not zyrtec D)  Come back to see Dr. Lajuana Ripple in 4 months

## 2017-06-28 NOTE — Progress Notes (Signed)
   HPI  Patient presents today here for follow-up chronic medical conditions.  Diabetes She is watching her diet. No meds currently  Allergies With rhinoorhea, not helped by flonase X 2-3 months Uses vicks vapor rub  A fib No CP or palps No bleeding with eliquis   PMH: Smoking status noted ROS: Per HPI  Objective: BP (!) 126/53   Pulse (!) 59   Temp (!) 97.2 F (36.2 C) (Oral)   SpO2 97%  Gen: NAD, alert, cooperative with exam HEENT: NCAT, EOMI, PERRL CV: Irregularly irregular Resp: CTABL, no wheezes, non-labored Ext: 1+ pitting edema Neuro: Alert and oriented, No gross deficits  Assessment and plan:  # DM2 Well controlled No chnages  # allergies Trial of Zyrtec, primarily rhinorrhea, Flonase was ineffective  #Atrial fibrillation Rate controlled, Eliquis Labs   Orders Placed This Encounter  Procedures  . Bayer DCA Hb A1c Waived  . CMP14+EGFR  . CBC with Differential/Platelet    Sam Bradshaw, MD [facility] 06/28/2017, 3:11 PM     

## 2017-06-29 LAB — CBC WITH DIFFERENTIAL/PLATELET
Basophils Absolute: 0 10*3/uL (ref 0.0–0.2)
Basos: 0 %
EOS (ABSOLUTE): 0 10*3/uL (ref 0.0–0.4)
EOS: 1 %
Hematocrit: 34.7 % (ref 34.0–46.6)
Hemoglobin: 11.5 g/dL (ref 11.1–15.9)
IMMATURE GRANS (ABS): 0 10*3/uL (ref 0.0–0.1)
Immature Granulocytes: 0 %
LYMPHS: 27 %
Lymphocytes Absolute: 1.4 10*3/uL (ref 0.7–3.1)
MCH: 30.6 pg (ref 26.6–33.0)
MCHC: 33.1 g/dL (ref 31.5–35.7)
MCV: 92 fL (ref 79–97)
MONOCYTES: 9 %
Monocytes Absolute: 0.4 10*3/uL (ref 0.1–0.9)
NEUTROS ABS: 3.1 10*3/uL (ref 1.4–7.0)
Neutrophils: 63 %
PLATELETS: 161 10*3/uL (ref 150–450)
RBC: 3.76 x10E6/uL — ABNORMAL LOW (ref 3.77–5.28)
RDW: 14.7 % (ref 12.3–15.4)
WBC: 5 10*3/uL (ref 3.4–10.8)

## 2017-06-29 LAB — CMP14+EGFR
ALBUMIN: 4 g/dL (ref 3.2–4.6)
ALT: 8 IU/L (ref 0–32)
AST: 20 IU/L (ref 0–40)
Albumin/Globulin Ratio: 1.7 (ref 1.2–2.2)
Alkaline Phosphatase: 92 IU/L (ref 39–117)
BUN/Creatinine Ratio: 26 (ref 12–28)
BUN: 28 mg/dL (ref 10–36)
Bilirubin Total: 0.4 mg/dL (ref 0.0–1.2)
CALCIUM: 9.1 mg/dL (ref 8.7–10.3)
CO2: 24 mmol/L (ref 20–29)
CREATININE: 1.06 mg/dL — AB (ref 0.57–1.00)
Chloride: 104 mmol/L (ref 96–106)
GFR calc Af Amer: 53 mL/min/{1.73_m2} — ABNORMAL LOW (ref >59)
GFR calc non Af Amer: 46 mL/min/{1.73_m2} — ABNORMAL LOW (ref >59)
GLUCOSE: 139 mg/dL — AB (ref 65–99)
Globulin, Total: 2.3 g/dL (ref 1.5–4.5)
Potassium: 3.9 mmol/L (ref 3.5–5.2)
Sodium: 143 mmol/L (ref 134–144)
TOTAL PROTEIN: 6.3 g/dL (ref 6.0–8.5)

## 2017-08-05 ENCOUNTER — Other Ambulatory Visit: Payer: Self-pay | Admitting: Family Medicine

## 2017-08-05 DIAGNOSIS — R630 Anorexia: Secondary | ICD-10-CM

## 2017-10-28 ENCOUNTER — Other Ambulatory Visit: Payer: Self-pay | Admitting: *Deleted

## 2017-10-28 DIAGNOSIS — R630 Anorexia: Secondary | ICD-10-CM

## 2017-10-28 DIAGNOSIS — I10 Essential (primary) hypertension: Secondary | ICD-10-CM

## 2017-10-28 MED ORDER — MIRTAZAPINE 7.5 MG PO TABS: 7.5000 mg | ORAL_TABLET | Freq: Every day | ORAL | 0 refills | Status: DC

## 2017-10-28 MED ORDER — NEBIVOLOL HCL 5 MG PO TABS: 5.0000 mg | ORAL_TABLET | Freq: Every day | ORAL | 0 refills | Status: DC

## 2017-10-28 MED ORDER — LINACLOTIDE 290 MCG PO CAPS: ORAL_CAPSULE | ORAL | 0 refills | Status: DC

## 2017-10-28 MED ORDER — APIXABAN 2.5 MG PO TABS: 2.5000 mg | ORAL_TABLET | Freq: Two times a day (BID) | ORAL | 0 refills | Status: DC

## 2017-10-28 MED ORDER — HYDROCHLOROTHIAZIDE 12.5 MG PO CAPS: 12.5000 mg | ORAL_CAPSULE | Freq: Every day | ORAL | 0 refills | Status: DC

## 2017-10-28 MED ORDER — OLMESARTAN MEDOXOMIL 20 MG PO TABS: 20.0000 mg | ORAL_TABLET | Freq: Every day | ORAL | 0 refills | Status: DC

## 2017-10-29 ENCOUNTER — Ambulatory Visit (INDEPENDENT_AMBULATORY_CARE_PROVIDER_SITE_OTHER): Payer: Medicare Other | Admitting: Family Medicine

## 2017-10-29 ENCOUNTER — Encounter: Payer: Self-pay | Admitting: Family Medicine

## 2017-10-29 VITALS — BP 138/64 | HR 65 | Temp 96.8°F | Ht 63.0 in | Wt 99.2 lb

## 2017-10-29 DIAGNOSIS — E441 Mild protein-calorie malnutrition: Secondary | ICD-10-CM | POA: Insufficient documentation

## 2017-10-29 DIAGNOSIS — N183 Chronic kidney disease, stage 3 unspecified: Secondary | ICD-10-CM

## 2017-10-29 DIAGNOSIS — I4891 Unspecified atrial fibrillation: Secondary | ICD-10-CM

## 2017-10-29 DIAGNOSIS — J309 Allergic rhinitis, unspecified: Secondary | ICD-10-CM

## 2017-10-29 DIAGNOSIS — Z23 Encounter for immunization: Secondary | ICD-10-CM | POA: Diagnosis not present

## 2017-10-29 DIAGNOSIS — H548 Legal blindness, as defined in USA: Secondary | ICD-10-CM | POA: Insufficient documentation

## 2017-10-29 DIAGNOSIS — I10 Essential (primary) hypertension: Secondary | ICD-10-CM

## 2017-10-29 DIAGNOSIS — Z7901 Long term (current) use of anticoagulants: Secondary | ICD-10-CM | POA: Diagnosis not present

## 2017-10-29 DIAGNOSIS — E119 Type 2 diabetes mellitus without complications: Secondary | ICD-10-CM | POA: Diagnosis not present

## 2017-10-29 LAB — BAYER DCA HB A1C WAIVED: HB A1C (BAYER DCA - WAIVED): 7.6 % — ABNORMAL HIGH (ref <7.0)

## 2017-10-29 MED ORDER — FLUTICASONE PROPIONATE 50 MCG/ACT NA SUSP: NASAL | 6 refills | Status: DC

## 2017-10-29 MED ORDER — AMLODIPINE BESYLATE 5 MG PO TABS: 5.0000 mg | ORAL_TABLET | Freq: Every day | ORAL | 1 refills | Status: DC

## 2017-10-29 MED ORDER — LORATADINE 10 MG PO TABS: 10.0000 mg | ORAL_TABLET | Freq: Every day | ORAL | 11 refills | Status: DC

## 2017-10-29 NOTE — Patient Instructions (Signed)
You had labs performed today.  You will be contacted with the results of the labs once they are available, usually in the next 3 business days for routine lab work.   I have sent in loratadine (Claritin) for her to take 1 tablet of daily.  This will last 24 hours to help her with allergy symptoms.  I would prefer that she not use Benadryl if possible, as this medication can induce altered mental status and problems with urination.  Is not recommended in the elderly unless she is having a severe allergic reaction.  He may schedule at your convenience to have her flu shot done.  She had her pneumonia vaccine done today.  Allergic Rhinitis, Adult Allergic rhinitis is an allergic reaction that affects the mucous membrane inside the nose. It causes sneezing, a runny or stuffy nose, and the feeling of mucus going down the back of the throat (postnasal drip). Allergic rhinitis can be mild to severe. There are two types of allergic rhinitis:  Seasonal. This type is also called hay fever. It happens only during certain seasons.  Perennial. This type can happen at any time of the year.  What are the causes? This condition happens when the body's defense system (immune system) responds to certain harmless substances called allergens as though they were germs.  Seasonal allergic rhinitis is triggered by pollen, which can come from grasses, trees, and weeds. Perennial allergic rhinitis may be caused by:  House dust mites.  Pet dander.  Mold spores.  What are the signs or symptoms? Symptoms of this condition include:  Sneezing.  Runny or stuffy nose (nasal congestion).  Postnasal drip.  Itchy nose.  Tearing of the eyes.  Trouble sleeping.  Daytime sleepiness.  How is this diagnosed? This condition may be diagnosed based on:  Your medical history.  A physical exam.  Tests to check for related conditions, such as: ? Asthma. ? Pink eye. ? Ear infection. ? Upper respiratory  infection.  Tests to find out which allergens trigger your symptoms. These may include skin or blood tests.  How is this treated? There is no cure for this condition, but treatment can help control symptoms. Treatment may include:  Taking medicines that block allergy symptoms, such as antihistamines. Medicine may be given as a shot, nasal spray, or pill.  Avoiding the allergen.  Desensitization. This treatment involves getting ongoing shots until your body becomes less sensitive to the allergen. This treatment may be done if other treatments do not help.  If taking medicine and avoiding the allergen does not work, new, stronger medicines may be prescribed.  Follow these instructions at home:  Find out what you are allergic to. Common allergens include smoke, dust, and pollen.  Avoid the things you are allergic to. These are some things you can do to help avoid allergens: ? Replace carpet with wood, tile, or vinyl flooring. Carpet can trap dander and dust. ? Do not smoke. Do not allow smoking in your home. ? Change your heating and air conditioning filter at least once a month. ? During allergy season:  Keep windows closed as much as possible.  Plan outdoor activities when pollen counts are lowest. This is usually during the evening hours.  When coming indoors, change clothing and shower before sitting on furniture or bedding.  Take over-the-counter and prescription medicines only as told by your health care provider.  Keep all follow-up visits as told by your health care provider. This is important. Contact a health  care provider if:  You have a fever.  You develop a persistent cough.  You make whistling sounds when you breathe (you wheeze).  Your symptoms interfere with your normal daily activities. Get help right away if:  You have shortness of breath. Summary  This condition can be managed by taking medicines as directed and avoiding allergens.  Contact your  health care provider if you develop a persistent cough or fever.  During allergy season, keep windows closed as much as possible. This information is not intended to replace advice given to you by your health care provider. Make sure you discuss any questions you have with your health care provider. Document Released: 10/21/2000 Document Revised: 03/05/2016 Document Reviewed: 03/05/2016 Elsevier Interactive Patient Education  [employer].

## 2017-10-29 NOTE — Addendum Note (Signed)
Addended byCarrolyn Leigh on: 10/29/2017 04:46 PM   Modules accepted: Orders

## 2017-10-29 NOTE — Progress Notes (Signed)
Subjective: CC: DM2 PCP: Stacey Norlander, DO Stacey Brown is a 84 y.o. female presenting to clinic today for:  1. Type 2 Diabetes with hypertension, CKD 3 Patient reports she has had no low blood sugar since discontinuing medications for diabetes.  No blood sugars over 200.  Patient is legally blind in both eyes but does see some out of the right.  She is on an ARB.  She has known CKD 3.  She is on Norvasc 5 mg as well as hydrochlorothiazide 12.5 mg for blood pressure.  Needs PNA vaccine and Flu vaccine.  Denies fever, chills, dizziness, shortness of breath or chest pain.    2.  Allergic rhinitis Patient reports allergy symptoms including rhinorrhea and occasional sinus congestion.  Denies any fevers, chills.  No shortness of breath or wheezing.  She uses Flonase daily but does not feel like it helps much.  Her caregiver occasionally will give her Benadryl at nighttime, 1/2 tablet.  She notes this does not seem to help during the day.  Does not report any urinary retention or altered mentation with Benadryl.  3.  Low BMI/malnourishment Patient with chronic history of difficulty swallowing.  Her caregiver notes that she had extensive testing performed and often, they are unable to get scopes down her throat.  She is on a soft mechanical diet.  She does eat some protein but finds it physically difficult to do because things will get caught in her throat.  She has no problems drinking liquids.  Her caregiver notes that they supplement with [place] yogurt and other soft things that have protein in them.  She is not currently on any protein drinks.  ROS: Per HPI  Allergies  Allergen Reactions  . Dexilant [Dexlansoprazole]     Throat swells   . Ace Inhibitors Cough   Past Medical History:  Diagnosis Date  . Abnormal liver function test   . Allergy    allergic  rhinitis  . Arthritis   . ASCVD (arteriosclerotic cardiovascular disease)   . Atrial fibrillation ([place])   . Cataract   .  Chronic anticoagulation   . Chronic kidney disease   . Diabetes mellitus without complication ([place])   . Esophageal stricture   . GERD (gastroesophageal reflux disease)   . Glaucoma   . H/O TB (tuberculosis)   . History of DVT of lower extremity   . Hyperlipidemia   . Macular degeneration   . Osteoporosis   . Primary biliary cirrhosis ([place])   . Shingles   . Swallowing difficulty     Current Outpatient Medications:  .  ACCU-CHEK AVIVA PLUS test strip, USE UP TO 3 TIMES A DAY AS DIRECTED., Disp: 100 each, Rfl: 2 .  amLODipine (NORVASC) 5 MG tablet, Take 1 tablet (5 mg total) by mouth daily., Disp: 90 tablet, Rfl: 1 .  apixaban (ELIQUIS) 2.5 MG TABS tablet, Take 1 tablet (2.5 mg total) by mouth 2 (two) times daily., Disp: 180 tablet, Rfl: 0 .  blood glucose meter kit and supplies, Dispense based on patient and insurance preference. Use up to four times daily as directed. (FOR ICD-9 250.00, 250.01)., Disp: 1 each, Rfl: 0 .  carboxymethylcellulose (REFRESH PLUS) 0.5 % SOLN, 1 drop 3 (three) times daily as needed., Disp: , Rfl:  .  cromolyn (OPTICROM) 4 % ophthalmic solution, , Disp: , Rfl:  .  fluticasone (FLONASE) 50 MCG/ACT nasal spray, USE TWO SPRAY(S) IN EACH NOSTRIL ONCE DAILY, Disp: 16 g, Rfl: 6 .  hydrochlorothiazide (MICROZIDE) 12.5 MG capsule, Take 1 capsule (12.5 mg total) by mouth daily., Disp: 90 capsule, Rfl: 0 .  linaclotide (LINZESS) 290 MCG CAPS capsule, TAKE (1) CAPSULE DAILY, Disp: 90 capsule, Rfl: 0 .  Menthol-Zinc Oxide (CALMOSEPTINE) 0.44-20.6 % OINT, Use BID on bottom and as needed, Disp: 71 g, Rfl: 0 .  mirtazapine (REMERON) 7.5 MG tablet, Take 1 tablet (7.5 mg total) by mouth at bedtime., Disp: 90 tablet, Rfl: 0 .  Misc. Devices (RAISED TOILET SEAT) MISC, Use as directed.  Dx:  High fall risk R 29.6 and osteoporosis M 81.0, Disp: 1 each, Rfl: 0 .  mupirocin ointment (BACTROBAN) 2 %, Apply 1 application topically 2 (two) times daily., Disp: 22 g, Rfl: 0 .  nebivolol  (BYSTOLIC) 5 MG tablet, Take 1 tablet (5 mg total) by mouth daily., Disp: 90 tablet, Rfl: 0 .  nystatin cream (MYCOSTATIN), Apply 1 application topically 2 (two) times daily., Disp: 30 g, Rfl: 2 .  olmesartan (BENICAR) 20 MG tablet, Take 1 tablet (20 mg total) by mouth daily., Disp: 90 tablet, Rfl: 0 .  polyethylene glycol powder (GLYCOLAX/MIRALAX) powder, Take 17 g by mouth daily., Disp: 3350 g, Rfl: 1 .  STARCH-MALTO DEXTRIN (THICK-IT) POWD, Take by mouth., Disp: , Rfl:  .  travoprost, benzalkonium, (TRAVATAN) 0.004 % ophthalmic solution, Place 1 drop into both eyes at bedtime., Disp: , Rfl:  .  trimethoprim-polymyxin b (POLYTRIM) ophthalmic solution, Place 1 drop into both eyes every 6 (six) hours., Disp: 10 mL, Rfl: 1 Social History   Socioeconomic History  . Marital status: Widowed    Spouse name: Not on file  . Number of children: 1  . Years of education: Not on file  . Highest education level: Not on file  Occupational History  . Occupation: Retired    Comment: Disabled  Social Needs  . Financial resource strain: Not hard at all  . Food insecurity:    Worry: Never true    Inability: Never true  . Transportation needs:    Medical: No    Non-medical: No  Tobacco Use  . Smoking status: Never Smoker  . Smokeless tobacco: Never Used  Substance and Sexual Activity  . Alcohol use: No  . Drug use: No  . Sexual activity: Never  Lifestyle  . Physical activity:    Days per week: 0 days    Minutes per session: Not on file  . Stress: Not at all  Relationships  . Social connections:    Talks on phone: More than three times a week    Gets together: More than three times a week    Attends religious service: Not on file    Active member of club or organization: Not on file    Attends meetings of clubs or organizations: Not on file    Relationship status: Not on file  . Intimate partner violence:    Fear of current or ex partner: Not on file    Emotionally abused: Not on file     Physically abused: Not on file    Forced sexual activity: Not on file  Other Topics Concern  . Not on file  Social History Narrative   Daily caffeine Use   Family History  Problem Relation Age of Onset  . Breast cancer Other        Neice  . Heart disease Mother   . Diabetes Mother        Brother,  . Heart disease Brother   .  Kidney disease Cousin   . Cancer Father   . Osteoporosis Sister   . Hyperlipidemia Daughter   . Diabetes Daughter   . Diabetes Brother   . Thyroid disease Brother   . Cirrhosis Other        Nephew  . Colon cancer Neg Hx     Objective: Office vital signs reviewed. BP 138/64   Pulse 65   Temp (!) 96.8 F (36 C) (Oral)   Ht '5\' 3"'  (1.6 m)   Wt 99 lb 3.2 oz (45 kg)   BMI 17.57 kg/m   Physical Examination:  General: Awake, alert, thin, chronically ill appearing elderly female, frail, No acute distress HEENT: Normal    Neck: No masses palpated. No lymphadenopathy    Ears: Tympanic membranes intact, normal light reflex, no erythema, no bulging    Nose: nasal turbinates moist, clear nasal discharge    Throat: moist mucus membranes, cobblestone appearance of the oropharynx Cardio: irregularly irregular but rate controlled, S1S2 heard, no murmurs appreciated Pulm: clear to auscultation bilaterally, no wheezes, rhonchi or rales; normal work of breathing on room air MSK: arrives in wheelchair Skin: area of ecchymosis along left dorsum of hand.  Assessment/ Plan: 84 y.o. female   1. Diabetes mellitus without complication (HCC) D4Y was appropriate today at less than 8.  I think that this is within reason for patient with multiple comorbidities.  Continue supplementing with protein in efforts to maintain body mass and strength.  Pneumococcal vaccine updated today.  We will plan for diabetic foot exam at next visit. - Bayer DCA Hb A1c Waived - Basic Metabolic Panel  2. Atrial fibrillation, unspecified type ([place]) Rate controlled. - CBC with  Differential  3. Chronic anticoagulation Bruising noted along dorsum of left hand.  Otherwise tolerating medication without difficulty.  4. Stage 3 chronic kidney disease (HCC) Check BMP.  On ARB.  Blood pressure within normal limits for age. - Basic Metabolic Panel - CBC with Differential  5. Essential hypertension, benign Blood pressure controlled on current regimen.  No changes. - amLODipine (NORVASC) 5 MG tablet; Take 1 tablet (5 mg total) by mouth daily.  Dispense: 90 tablet; Refill: 1  6. Allergic rhinitis, unspecified seasonality, unspecified trigger Add Claritin.  Advised against use of Benadryl unless she is having an allergic reaction.  We discussed possible side effects of Benadryl in the elderly.  Okay to use Flonase if helpful. - loratadine (CLARITIN) 10 MG tablet; Take 1 tablet (10 mg total) by mouth daily.  Dispense: 30 tablet; Refill: 11 - fluticasone (FLONASE) 50 MCG/ACT nasal spray; USE TWO SPRAY(S) IN EACH NOSTRIL ONCE DAILY  Dispense: 16 g; Refill: 6  7. Legally blind  8. Mild protein-calorie malnutrition ([place]) Continue supplementing with protein rich foods as tolerated.  We discussed considering adding Ensure/Glucerna twice daily.  Would be happy to prescribe this in the future should the patient desire.   Orders Placed This Encounter  Procedures  . Bayer DCA Hb A1c Waived   Meds ordered this encounter  Medications  . loratadine (CLARITIN) 10 MG tablet    Sig: Take 1 tablet (10 mg total) by mouth daily.    Dispense:  30 tablet    Refill:  11  . amLODipine (NORVASC) 5 MG tablet    Sig: Take 1 tablet (5 mg total) by mouth daily.    Dispense:  90 tablet    Refill:  1  . fluticasone (FLONASE) 50 MCG/ACT nasal spray    Sig: USE TWO SPRAY(S)  IN EACH NOSTRIL ONCE DAILY    Dispense:  16 g    Refill:  6    Please consider 90 day supplies to promote better adherence     Stacey Norlander, DO [facility] [phone]

## 2017-10-30 LAB — CBC WITH DIFFERENTIAL/PLATELET
BASOS ABS: 0 10*3/uL (ref 0.0–0.2)
BASOS: 0 %
EOS (ABSOLUTE): 0.1 10*3/uL (ref 0.0–0.4)
Eos: 1 %
Hematocrit: 37.2 % (ref 34.0–46.6)
Hemoglobin: 12.4 g/dL (ref 11.1–15.9)
Immature Grans (Abs): 0 10*3/uL (ref 0.0–0.1)
Immature Granulocytes: 0 %
Lymphocytes Absolute: 1.4 10*3/uL (ref 0.7–3.1)
Lymphs: 24 %
MCH: 30.2 pg (ref 26.6–33.0)
MCHC: 33.3 g/dL (ref 31.5–35.7)
MCV: 91 fL (ref 79–97)
MONOS ABS: 0.4 10*3/uL (ref 0.1–0.9)
Monocytes: 6 %
NEUTROS PCT: 69 %
Neutrophils Absolute: 4.1 10*3/uL (ref 1.4–7.0)
PLATELETS: 188 10*3/uL (ref 150–450)
RBC: 4.11 x10E6/uL (ref 3.77–5.28)
RDW: 14.3 % (ref 12.3–15.4)
WBC: 6 10*3/uL (ref 3.4–10.8)

## 2017-10-30 LAB — BASIC METABOLIC PANEL
BUN/Creatinine Ratio: 26 (ref 12–28)
BUN: 29 mg/dL (ref 10–36)
CALCIUM: 9.4 mg/dL (ref 8.7–10.3)
CHLORIDE: 102 mmol/L (ref 96–106)
CO2: 24 mmol/L (ref 20–29)
Creatinine, Ser: 1.11 mg/dL — ABNORMAL HIGH (ref 0.57–1.00)
GFR calc Af Amer: 50 mL/min/{1.73_m2} — ABNORMAL LOW (ref >59)
GFR calc non Af Amer: 43 mL/min/{1.73_m2} — ABNORMAL LOW (ref >59)
GLUCOSE: 185 mg/dL — AB (ref 65–99)
POTASSIUM: 4.2 mmol/L (ref 3.5–5.2)
SODIUM: 142 mmol/L (ref 134–144)

## 2017-11-03 ENCOUNTER — Telehealth: Payer: Self-pay | Admitting: Family Medicine

## 2017-11-03 NOTE — Telephone Encounter (Signed)
Results given and mailed to daughter

## 2017-12-16 DIAGNOSIS — M79676 Pain in unspecified toe(s): Secondary | ICD-10-CM | POA: Diagnosis not present

## 2017-12-16 DIAGNOSIS — I70203 Unspecified atherosclerosis of native arteries of extremities, bilateral legs: Secondary | ICD-10-CM | POA: Diagnosis not present

## 2017-12-16 DIAGNOSIS — L84 Corns and callosities: Secondary | ICD-10-CM | POA: Diagnosis not present

## 2017-12-16 DIAGNOSIS — B351 Tinea unguium: Secondary | ICD-10-CM | POA: Diagnosis not present

## 2018-01-04 ENCOUNTER — Ambulatory Visit: Payer: Medicare Other

## 2018-01-28 ENCOUNTER — Other Ambulatory Visit: Payer: Self-pay | Admitting: Family Medicine

## 2018-01-28 DIAGNOSIS — R630 Anorexia: Secondary | ICD-10-CM

## 2018-01-28 DIAGNOSIS — I10 Essential (primary) hypertension: Secondary | ICD-10-CM

## 2018-03-01 ENCOUNTER — Ambulatory Visit (INDEPENDENT_AMBULATORY_CARE_PROVIDER_SITE_OTHER): Payer: Medicare Other | Admitting: Family Medicine

## 2018-03-01 ENCOUNTER — Encounter: Payer: Self-pay | Admitting: Family Medicine

## 2018-03-01 VITALS — BP 138/58 | HR 66 | Temp 96.8°F | Ht 62.0 in | Wt 98.6 lb

## 2018-03-01 DIAGNOSIS — Z7901 Long term (current) use of anticoagulants: Secondary | ICD-10-CM

## 2018-03-01 DIAGNOSIS — E119 Type 2 diabetes mellitus without complications: Secondary | ICD-10-CM | POA: Diagnosis not present

## 2018-03-01 DIAGNOSIS — N183 Chronic kidney disease, stage 3 (moderate): Secondary | ICD-10-CM | POA: Diagnosis not present

## 2018-03-01 DIAGNOSIS — E1122 Type 2 diabetes mellitus with diabetic chronic kidney disease: Secondary | ICD-10-CM

## 2018-03-01 DIAGNOSIS — Z23 Encounter for immunization: Secondary | ICD-10-CM

## 2018-03-01 DIAGNOSIS — J3489 Other specified disorders of nose and nasal sinuses: Secondary | ICD-10-CM

## 2018-03-01 DIAGNOSIS — I4891 Unspecified atrial fibrillation: Secondary | ICD-10-CM

## 2018-03-01 DIAGNOSIS — R600 Localized edema: Secondary | ICD-10-CM | POA: Diagnosis not present

## 2018-03-01 DIAGNOSIS — I152 Hypertension secondary to endocrine disorders: Secondary | ICD-10-CM

## 2018-03-01 DIAGNOSIS — E1159 Type 2 diabetes mellitus with other circulatory complications: Secondary | ICD-10-CM | POA: Diagnosis not present

## 2018-03-01 DIAGNOSIS — L89152 Pressure ulcer of sacral region, stage 2: Secondary | ICD-10-CM

## 2018-03-01 DIAGNOSIS — I10 Essential (primary) hypertension: Secondary | ICD-10-CM

## 2018-03-01 DIAGNOSIS — E441 Mild protein-calorie malnutrition: Secondary | ICD-10-CM

## 2018-03-01 LAB — BAYER DCA HB A1C WAIVED: HB A1C (BAYER DCA - WAIVED): 6.9 % (ref <7.0)

## 2018-03-01 LAB — HEMOGLOBIN, FINGERSTICK: Hemoglobin: 11.9 g/dL (ref 11.1–15.9)

## 2018-03-01 MED ORDER — AZELASTINE HCL 0.1 % NA SOLN: 2.0000 | Freq: Two times a day (BID) | NASAL | 12 refills | Status: DC

## 2018-03-01 MED ORDER — CAREX COCCYX CUSHION MISC: 0 refills | Status: DC

## 2018-03-01 NOTE — Patient Instructions (Addendum)
You had labs performed today.  You will be contacted with the results of the labs once they are available, usually in the next 3 business days for routine lab work.  If you had a pap smear or biopsy performed, expect to be contacted in about 7-10 days.  I have ordered compression stockings and a cushion to help with the pressure sore on her rear end.  Is very important to change positions frequently to avoid reopening this sore.  She should also continue to eat enough calories and protein to help healing.  Her blood sugar and hemoglobin level look good today. No anemia.  I sent in a new nasal spray called Astelin to replace her fluticasone.  She can use this twice daily if needed.  Hopefully this will dry up her runny nose.   Edema  Edema is when you have too much fluid in your body or under your skin. Edema may make your legs, feet, and ankles swell up. Swelling is also common in looser tissues, like around your eyes. This is a common condition. It gets more common as you get older. There are many possible causes of edema. Eating too much salt (sodium) and being on your feet or sitting for a long time can cause edema in your legs, feet, and ankles. Hot weather may make edema worse. Edema is usually painless. Your skin may look swollen or shiny. Follow these instructions at home:  Keep the swollen body part raised (elevated) above the level of your heart when you are sitting or lying down.  Do not sit still or stand for a long time.  Do not wear tight clothes. Do not wear garters on your upper legs.  Exercise your legs. This can help the swelling go down.  Wear elastic bandages or support stockings as told by your doctor.  Eat a low-salt (low-sodium) diet to reduce fluid as told by your doctor.  Depending on the cause of your swelling, you may need to limit how much fluid you drink (fluid restriction).  Take over-the-counter and prescription medicines only as told by your  doctor. Contact a doctor if:  Treatment is not working.  You have heart, liver, or kidney disease and have symptoms of edema.  You have sudden and unexplained weight gain. Get help right away if:  You have shortness of breath or chest pain.  You cannot breathe when you lie down.  You have pain, redness, or warmth in the swollen areas.  You have heart, liver, or kidney disease and get edema all of a sudden.  You have a fever and your symptoms get worse all of a sudden. Summary  Edema is when you have too much fluid in your body or under your skin.  Edema may make your legs, feet, and ankles swell up. Swelling is also common in looser tissues, like around your eyes.  Raise (elevate) the swollen body part above the level of your heart when you are sitting or lying down.  Follow your doctor's instructions about diet and how much fluid you can drink (fluid restriction). This information is not intended to replace advice given to you by your health care provider. Make sure you discuss any questions you have with your health care provider. Document Released: 07/15/2007 Document Revised: 02/14/2016 Document Reviewed: 02/14/2016 Elsevier Interactive Patient Education  2019 [employer].

## 2018-03-01 NOTE — Progress Notes (Signed)
Subjective: CC: DM2 PCP: Stacey Norlander, DO CNO:BSJG Stacey Brown is a 85 y.o. female presenting to clinic today for:  1. Type 2 Diabetes with hypertension, CKD 3/lower extremity edema/ Afib/ chronic anticoagulation Patient is brought to the office by her daughter who notes that she has had no hypoglycemic episodes.  She has been diet controlled with her type 2 diabetes for some time now.  She has known CKD and is on an ARB for this.  She is not had any problems with chest pain but she has had some lower extremity edema that seems more prominent than normal.  At baseline, she keeps her legs elevated, as she is often sedentary.  However, she no longer uses compression hose.  She has had no bleeds and reports compliance with Eliquis.  No changes in diet or medications.  Denies any orthopnea or shortness of breath.  Her family is trying to keep protein rich foods around as she has a low BMI see below  2.  Low BMI/malnourishment/pressure ulcer Patient with chronic history of difficulty swallowing and eats a soft mechanical diet.  They try to keep high-protein foods around so that they can improve her BMI and healing.  She has had issues with pressure sores in her daughter wants [place] to have a look at this today.  She uses a [place] salve and tries to keep the area clean with peroxide.  The area seems to heal on the right buttock but recurs.  No fevers or drainage.  They used a doughnut cushion intermittently.  She has no special air mattress or other seating arrangements to prevent these sores.  3.  Rhinorrhea [name] had chronic issues with rhinorrhea despite use of Claritin and Flonase.  Again no fevers or purulent drainage.  She is asking for an alternative.  ROS: Per HPI  Allergies  Allergen Reactions  . Dexilant [Dexlansoprazole]     Throat swells   . Ace Inhibitors Cough   Past Medical History:  Diagnosis Date  . Abnormal liver function test   . Allergy    allergic  rhinitis  .  Arthritis   . ASCVD (arteriosclerotic cardiovascular disease)   . Atrial fibrillation ([place])   . Cataract   . Chronic anticoagulation   . Chronic kidney disease   . Diabetes mellitus without complication ([place])   . Esophageal stricture   . GERD (gastroesophageal reflux disease)   . Glaucoma   . H/O TB (tuberculosis)   . History of DVT of lower extremity   . Hyperlipidemia   . Macular degeneration   . Osteoporosis   . Primary biliary cirrhosis ([place])   . Shingles   . Swallowing difficulty     Current Outpatient Medications:  .  ACCU-CHEK AVIVA PLUS test strip, USE UP TO 3 TIMES A DAY AS DIRECTED., Disp: 100 each, Rfl: 2 .  amLODipine (NORVASC) 5 MG tablet, Take 1 tablet (5 mg total) by mouth daily., Disp: 90 tablet, Rfl: 1 .  blood glucose meter kit and supplies, Dispense based on patient and insurance preference. Use up to four times daily as directed. (FOR ICD-9 250.00, 250.01)., Disp: 1 each, Rfl: 0 .  BYSTOLIC 5 MG tablet, TAKE 1 TABLET DAILY, Disp: 90 tablet, Rfl: 0 .  carboxymethylcellulose (REFRESH PLUS) 0.5 % SOLN, 1 drop 3 (three) times daily as needed., Disp: , Rfl:  .  cromolyn (OPTICROM) 4 % ophthalmic solution, , Disp: , Rfl:  .  ELIQUIS 2.5 MG TABS tablet, TAKE  (1)  TABLET TWICE A DAY., Disp: 180 tablet, Rfl: 0 .  fluticasone (FLONASE) 50 MCG/ACT nasal spray, USE TWO SPRAY(Stacey) IN EACH NOSTRIL ONCE DAILY, Disp: 16 g, Rfl: 6 .  hydrochlorothiazide (MICROZIDE) 12.5 MG capsule, TAKE (1) CAPSULE DAILY, Disp: 90 capsule, Rfl: 0 .  LINZESS 290 MCG CAPS capsule, TAKE (1) CAPSULE DAILY, Disp: 90 capsule, Rfl: 0 .  loratadine (CLARITIN) 10 MG tablet, Take 1 tablet (10 mg total) by mouth daily., Disp: 30 tablet, Rfl: 11 .  Menthol-Zinc Oxide (CALMOSEPTINE) 0.44-20.6 % OINT, Use BID on bottom and as needed, Disp: 71 g, Rfl: 0 .  mirtazapine (REMERON) 7.5 MG tablet, TAKE ONE TABLET AT BEDTIME, Disp: 90 tablet, Rfl: 0 .  Misc. Devices (RAISED TOILET SEAT) MISC, Use as directed.  Dx:   High fall risk R 29.6 and osteoporosis M 81.0, Disp: 1 each, Rfl: 0 .  mupirocin ointment (BACTROBAN) 2 %, Apply 1 application topically 2 (two) times daily., Disp: 22 g, Rfl: 0 .  nystatin cream (MYCOSTATIN), Apply 1 application topically 2 (two) times daily., Disp: 30 g, Rfl: 2 .  olmesartan (BENICAR) 20 MG tablet, TAKE 1 TABLET DAILY, Disp: 90 tablet, Rfl: 0 .  polyethylene glycol powder (GLYCOLAX/MIRALAX) powder, Take 17 g by mouth daily., Disp: 3350 g, Rfl: 1 .  STARCH-MALTO DEXTRIN (THICK-IT) POWD, Take by mouth., Disp: , Rfl:  .  travoprost, benzalkonium, (TRAVATAN) 0.004 % ophthalmic solution, Place 1 drop into both eyes at bedtime., Disp: , Rfl:  Social History   Socioeconomic History  . Marital status: Widowed    Spouse name: Not on file  . Number of children: 1  . Years of education: Not on file  . Highest education level: Not on file  Occupational History  . Occupation: Retired    Comment: Disabled  Social Needs  . Financial resource strain: Not hard at all  . Food insecurity:    Worry: Never true    Inability: Never true  . Transportation needs:    Medical: No    Non-medical: No  Tobacco Use  . Smoking status: Never Smoker  . Smokeless tobacco: Never Used  Substance and Sexual Activity  . Alcohol use: No  . Drug use: No  . Sexual activity: Never  Lifestyle  . Physical activity:    Days per week: 0 days    Minutes per session: Not on file  . Stress: Not at all  Relationships  . Social connections:    Talks on phone: More than three times a week    Gets together: More than three times a week    Attends religious service: Not on file    Active member of club or organization: Not on file    Attends meetings of clubs or organizations: Not on file    Relationship status: Not on file  . Intimate partner violence:    Fear of current or ex partner: Not on file    Emotionally abused: Not on file    Physically abused: Not on file    Forced sexual activity: Not on  file  Other Topics Concern  . Not on file  Social History Narrative   Daily caffeine Use   Family History  Problem Relation Age of Onset  . Breast cancer Other        Neice  . Heart disease Mother   . Diabetes Mother        Brother,  . Heart disease Brother   . Kidney disease Cousin   .  Cancer Father   . Osteoporosis Sister   . Hyperlipidemia Daughter   . Diabetes Daughter   . Diabetes Brother   . Thyroid disease Brother   . Cirrhosis Other        Nephew  . Colon cancer Neg Hx     Objective: Office vital signs reviewed. BP (!) 138/58   Pulse 66   Temp (!) 96.8 F (36 C) (Oral)   Ht '5\' 2"'  (1.575 m)   Wt 98 lb 9.6 oz (44.7 kg)   BMI 18.03 kg/m   Physical Examination:  General: Awake, alert, thin, frail appearing elderly female, No acute distress HEENT: Normal    Nose: nasal turbinates moist, clear nasal discharge Cardio: irregularly irregular, bradycardic, S1S2 heard, no murmurs appreciated Pulm: clear to auscultation bilaterally, no wheezes, rhonchi or rales; normal work of breathing on room air MSK: using rolling walker for ambulation today. Skin: area of ecchymosis along the sacrum.  Within the right gluteal fold, there is a small, healing pressure sore with surrounding erythema but no palpable induration, fluctuance or increased warmth. Extremities: 1+ pitting edema to bilateral ankles.  She has no increased warmth but does have some mild redness associated.  No skin breakdown or weeping of skin.  Assessment/ Plan: 85 y.o. female   1. Diabetes mellitus without complication ([place]) Under excellent control today through diet alone.  A1c was 6.9. - Bayer DCA Hb A1c Waived  2. Hypertension associated with diabetes ([place]) Blood pressure under control for age.  Continue current regimen.  3. Atrial fibrillation, unspecified type ([place]) Rate controlled.  Continue Eliquis  4. CKD stage 3 secondary to diabetes (H[facility]) Check CMP.  Lower extremity mid may be related to  renal function.  Hemoglobin did not indicate anemia today. - Hemoglobin, fingerstick - CMP14+EGFR  5. Chronic anticoagulation Hemoglobin stable.  No evidence of anemia - Hemoglobin, fingerstick  6. Pedal edema May be related to venous stasis changes, sedentary lifestyle.  She also has CKD and we will check CMP as above.  Check thyroid panel as well to rule out metabolic causes.  She has known protein calorie malnutrition as well which may explain pedal edema.  I have advised her to continue elevating lower extremities while seated, use compression hose.  At this time demonstrating no signs or symptoms to suggest fluid overload secondary to CHF - CMP14+EGFR - TSH  7. Mild protein-calorie malnutrition (HCC) Continue protein supplementation.  8. Rhinorrhea Stop Flonase, start Astelin.  Continue Claritin - azelastine (ASTELIN) 0.1 % nasal spray; Place 2 sprays into both nostrils 2 (two) times daily. Use in each nostril as directed  Dispense: 30 mL; Refill: 12  9. Lower extremity edema As above - Compression stockings  10. Pressure injury of sacral region, stage 2 (HCC) No evidence of secondary infection.  Appears to be healing pressure ulcer.  I have recommended that she obtain a cushion and change positions frequently to reduce recurrence of pressure ulcer - Misc. Devices (CAREX COCCYX CUSHION) MISC; UAD for pressure sores/ offset weight when seated  Dispense: 1 each; Refill: 0  11. Encounter for immunization Influenza vaccine administered - Flu vaccine HIGH DOSE PF   Orders Placed This Encounter  Procedures  . Compression stockings  . Flu vaccine HIGH DOSE PF  . Bayer DCA Hb A1c Waived  . Hemoglobin, fingerstick  . CMP14+EGFR  . TSH   Meds ordered this encounter  Medications  . azelastine (ASTELIN) 0.1 % nasal spray    Sig: Place 2 sprays into  both nostrils 2 (two) times daily. Use in each nostril as directed    Dispense:  30 mL    Refill:  12  . Misc. Devices (CAREX  COCCYX CUSHION) MISC    Sig: UAD for pressure sores/ offset weight when seated    Dispense:  1 each    Refill:  0     Stacey Windell Moulding, DO [place] [phone]

## 2018-03-02 LAB — CMP14+EGFR
ALK PHOS: 96 IU/L (ref 39–117)
ALT: 10 IU/L (ref 0–32)
AST: 16 IU/L (ref 0–40)
Albumin/Globulin Ratio: 1.3 (ref 1.2–2.2)
Albumin: 4.1 g/dL (ref 3.5–4.6)
BUN/Creatinine Ratio: 27 (ref 12–28)
BUN: 36 mg/dL (ref 10–36)
Bilirubin Total: 0.3 mg/dL (ref 0.0–1.2)
CO2: 20 mmol/L (ref 20–29)
Calcium: 9.7 mg/dL (ref 8.7–10.3)
Chloride: 104 mmol/L (ref 96–106)
Creatinine, Ser: 1.32 mg/dL — ABNORMAL HIGH (ref 0.57–1.00)
GFR calc Af Amer: 40 mL/min/{1.73_m2} — ABNORMAL LOW (ref >59)
GFR calc non Af Amer: 35 mL/min/{1.73_m2} — ABNORMAL LOW (ref >59)
Globulin, Total: 3.1 g/dL (ref 1.5–4.5)
Glucose: 231 mg/dL — ABNORMAL HIGH (ref 65–99)
Potassium: 4.1 mmol/L (ref 3.5–5.2)
Sodium: 145 mmol/L — ABNORMAL HIGH (ref 134–144)
Total Protein: 7.2 g/dL (ref 6.0–8.5)

## 2018-03-02 LAB — TSH: TSH: 4.08 u[IU]/mL (ref 0.450–4.500)

## 2018-04-13 ENCOUNTER — Telehealth: Payer: Self-pay | Admitting: Family Medicine

## 2018-04-13 NOTE — Telephone Encounter (Signed)
Details left on voice mail, use loratidine and astelin nasal spray.

## 2018-04-13 NOTE — Telephone Encounter (Signed)
In September I prescribed Loratadine 10mg  (she should have refills) and in January I added Astelin nasal spray.  Is she using these?

## 2018-04-27 ENCOUNTER — Other Ambulatory Visit: Payer: Self-pay | Admitting: Family Medicine

## 2018-04-27 DIAGNOSIS — I10 Essential (primary) hypertension: Secondary | ICD-10-CM

## 2018-05-02 ENCOUNTER — Other Ambulatory Visit: Payer: Self-pay | Admitting: Family Medicine

## 2018-05-06 ENCOUNTER — Other Ambulatory Visit: Payer: Self-pay | Admitting: Family Medicine

## 2018-05-06 DIAGNOSIS — R630 Anorexia: Secondary | ICD-10-CM

## 2018-05-19 ENCOUNTER — Other Ambulatory Visit: Payer: Self-pay | Admitting: Family Medicine

## 2018-06-01 ENCOUNTER — Ambulatory Visit (INDEPENDENT_AMBULATORY_CARE_PROVIDER_SITE_OTHER): Payer: Medicare Other | Admitting: Family Medicine

## 2018-06-01 ENCOUNTER — Other Ambulatory Visit: Payer: Self-pay

## 2018-06-01 DIAGNOSIS — I10 Essential (primary) hypertension: Secondary | ICD-10-CM

## 2018-06-01 DIAGNOSIS — L98421 Non-pressure chronic ulcer of back limited to breakdown of skin: Secondary | ICD-10-CM

## 2018-06-01 DIAGNOSIS — R630 Anorexia: Secondary | ICD-10-CM

## 2018-06-01 DIAGNOSIS — J3089 Other allergic rhinitis: Secondary | ICD-10-CM

## 2018-06-01 DIAGNOSIS — E441 Mild protein-calorie malnutrition: Secondary | ICD-10-CM | POA: Diagnosis not present

## 2018-06-01 DIAGNOSIS — I4891 Unspecified atrial fibrillation: Secondary | ICD-10-CM | POA: Diagnosis not present

## 2018-06-01 MED ORDER — AMLODIPINE BESYLATE 5 MG PO TABS: 5.0000 mg | ORAL_TABLET | Freq: Every day | ORAL | 1 refills | Status: DC

## 2018-06-01 MED ORDER — CHLORHEXIDINE GLUCONATE 4 % EX LIQD: Freq: Every day | CUTANEOUS | 0 refills | Status: DC | PRN

## 2018-06-01 MED ORDER — HYDROCHLOROTHIAZIDE 12.5 MG PO CAPS: ORAL_CAPSULE | ORAL | 0 refills | Status: DC

## 2018-06-01 MED ORDER — OLMESARTAN MEDOXOMIL 20 MG PO TABS: 20.0000 mg | ORAL_TABLET | Freq: Every day | ORAL | 0 refills | Status: DC

## 2018-06-01 MED ORDER — MIRTAZAPINE 7.5 MG PO TABS: 7.5000 mg | ORAL_TABLET | Freq: Every day | ORAL | 0 refills | Status: DC

## 2018-06-01 MED ORDER — APIXABAN 2.5 MG PO TABS: ORAL_TABLET | ORAL | 0 refills | Status: DC

## 2018-06-01 MED ORDER — NEBIVOLOL HCL 5 MG PO TABS: 5.0000 mg | ORAL_TABLET | Freq: Every day | ORAL | 1 refills | Status: DC

## 2018-06-01 NOTE — Progress Notes (Signed)
Telephone visit  Subjective: CC: f/u HTN, Afib, Malnourishment, pressure ulcer PCP: Janora Norlander, DO XNT:ZGYF S Stacey Brown is a 85 y.o. female calls for telephone consult today. Patient provides verbal consent for consult held via phone.  Location of patient: home Location of provider: [facility] Others present for call: daughter, Ruby  1.  Hypertension/atrial fibrillation Patient reports compliance with Benicar 20 mg, hydrochlorothiazide, Norvasc 5 mg, Eliquis 2.5 mg and Bystolic 5 mg.  No chest pain, shortness of breath, heart palpitations or sensation of racing heart.  She does have lower extremity edema but this is better than last time we saw each other.  She was not able to use the compression hose secondary to being uncomfortable but she does try and keep the legs elevated when able.  Pertaining to the blood thinner, she is had no problems with bleeding episodes including hematochezia, melena, hematuria.  2.  Malnourishment Patient reports appetite is good.  She has been taking the mirtazapine as directed.  A home health nurse came out recently and noted that her weight to be 100 pounds.  This was again of 2 pounds since our visit.  3.  Pressure ulcer Patient noted to have a pressure ulcer, stage II.  Her daughter reports that this has gotten quite a bit better.  They have been keeping the area clean with Dial soap.  They wonder if there is a prescription soap that may be better in reducing any possible infections.  4.  Allergic rhinitis/rhinorrhea Patient notes that this has improved/resolved with use of Astelin and Claritin.  ROS: Per HPI  Allergies  Allergen Reactions  . Dexilant [Dexlansoprazole]     Throat swells   . Ace Inhibitors Cough   Past Medical History:  Diagnosis Date  . Abnormal liver function test   . Allergy    allergic  rhinitis  . Arthritis   . ASCVD (arteriosclerotic cardiovascular disease)   . Atrial fibrillation ([place])   . Cataract   . Chronic  anticoagulation   . Chronic kidney disease   . Diabetes mellitus without complication ([place])   . Esophageal stricture   . GERD (gastroesophageal reflux disease)   . Glaucoma   . H/O TB (tuberculosis)   . History of DVT of lower extremity   . Hyperlipidemia   . Macular degeneration   . Osteoporosis   . Primary biliary cirrhosis ([place])   . Shingles   . Swallowing difficulty     Current Outpatient Medications:  .  ACCU-CHEK AVIVA PLUS test strip, USE UP TO 3 TIMES A DAY AS DIRECTED., Disp: 100 each, Rfl: 2 .  amLODipine (NORVASC) 5 MG tablet, Take 1 tablet (5 mg total) by mouth daily., Disp: 90 tablet, Rfl: 1 .  azelastine (ASTELIN) 0.1 % nasal spray, Place 2 sprays into both nostrils 2 (two) times daily. Use in each nostril as directed, Disp: 30 mL, Rfl: 12 .  blood glucose meter kit and supplies, Dispense based on patient and insurance preference. Use up to four times daily as directed. (FOR ICD-9 250.00, 250.01)., Disp: 1 each, Rfl: 0 .  BYSTOLIC 5 MG tablet, TAKE 1 TABLET DAILY, Disp: 90 tablet, Rfl: 0 .  carboxymethylcellulose (REFRESH PLUS) 0.5 % SOLN, 1 drop 3 (three) times daily as needed., Disp: , Rfl:  .  cromolyn (OPTICROM) 4 % ophthalmic solution, , Disp: , Rfl:  .  ELIQUIS 2.5 MG TABS tablet, TAKE (1) TABLET TWICE A DAY., Disp: 180 tablet, Rfl: 0 .  hydrochlorothiazide (MICROZIDE) 12.5 MG  capsule, TAKE (1) CAPSULE DAILY, Disp: 90 capsule, Rfl: 0 .  LINZESS 290 MCG CAPS capsule, TAKE (1) CAPSULE DAILY, Disp: 90 capsule, Rfl: 0 .  loratadine (CLARITIN) 10 MG tablet, Take 1 tablet (10 mg total) by mouth daily., Disp: 30 tablet, Rfl: 11 .  Menthol-Zinc Oxide (CALMOSEPTINE) 0.44-20.6 % OINT, Use BID on bottom and as needed, Disp: 71 g, Rfl: 0 .  mirtazapine (REMERON) 7.5 MG tablet, TAKE ONE TABLET AT BEDTIME, Disp: 90 tablet, Rfl: 0 .  Misc. Devices (CAREX COCCYX CUSHION) MISC, UAD for pressure sores/ offset weight when seated, Disp: 1 each, Rfl: 0 .  Misc. Devices (RAISED TOILET  SEAT) MISC, Use as directed.  Dx:  High fall risk R 29.6 and osteoporosis M 81.0, Disp: 1 each, Rfl: 0 .  mupirocin ointment (BACTROBAN) 2 %, Apply 1 application topically 2 (two) times daily., Disp: 22 g, Rfl: 0 .  nystatin cream (MYCOSTATIN), Apply 1 application topically 2 (two) times daily., Disp: 30 g, Rfl: 2 .  olmesartan (BENICAR) 20 MG tablet, TAKE 1 TABLET DAILY, Disp: 90 tablet, Rfl: 0 .  polyethylene glycol powder (GLYCOLAX/MIRALAX) powder, Take 17 g by mouth daily., Disp: 3350 g, Rfl: 1 .  STARCH-MALTO DEXTRIN (THICK-IT) POWD, Take by mouth., Disp: , Rfl:  .  travoprost, benzalkonium, (TRAVATAN) 0.004 % ophthalmic solution, Place 1 drop into both eyes at bedtime., Disp: , Rfl:   Assessment/ Plan: 85 y.o. female   1. Skin ulcer of sacrum, limited to breakdown of skin ([place]) Sounds like it has resolved or is resolving.  I have prescribed Hibiclens but instructed the patient and her daughter that this may or may not be covered by insurance.  I think that they are doing a fine job with the current home therapies. - chlorhexidine (HIBICLENS) 4 % external liquid; Apply topically daily as needed.  Dispense: 120 mL; Refill: 0  2. Mild protein-calorie malnutrition ([place]) She is gaining weight which is reassuring.  Encouraged p.o. intake and supplemental feeds as able  3. Allergic rhinitis due to other allergic trigger, unspecified seasonality Controlled with Astelin and Claritin  4. Atrial fibrillation, unspecified type ([place]) From her reports controlled.  I have renewed her medications  5. Essential hypertension, benign Controlled. - amLODipine (NORVASC) 5 MG tablet; Take 1 tablet (5 mg total) by mouth daily.  Dispense: 90 tablet; Refill: 1 - nebivolol (BYSTOLIC) 5 MG tablet; Take 1 tablet (5 mg total) by mouth daily.  Dispense: 90 tablet; Refill: 1  6. Appetite loss - mirtazapine (REMERON) 7.5 MG tablet; Take 1 tablet (7.5 mg total) by mouth at bedtime.  Dispense: 90 tablet; Refill:  0   Start time: 2:51pm End time: 3:01pm  Total time spent on patient care (including telephone call/ virtual visit): 15 minutes  [place], [place] [phone]

## 2018-08-19 ENCOUNTER — Encounter: Payer: Self-pay | Admitting: Family Medicine

## 2018-08-19 ENCOUNTER — Ambulatory Visit (INDEPENDENT_AMBULATORY_CARE_PROVIDER_SITE_OTHER): Payer: Medicare Other | Admitting: Family Medicine

## 2018-08-19 DIAGNOSIS — R197 Diarrhea, unspecified: Secondary | ICD-10-CM | POA: Diagnosis not present

## 2018-08-19 DIAGNOSIS — R079 Chest pain, unspecified: Secondary | ICD-10-CM | POA: Diagnosis not present

## 2018-08-19 DIAGNOSIS — I959 Hypotension, unspecified: Secondary | ICD-10-CM | POA: Diagnosis not present

## 2018-08-19 DIAGNOSIS — R4020 Unspecified coma: Secondary | ICD-10-CM | POA: Diagnosis not present

## 2018-08-19 MED ORDER — LOPERAMIDE HCL 2 MG PO CAPS: 2.0000 mg | ORAL_CAPSULE | ORAL | 0 refills | Status: DC | PRN

## 2018-08-19 NOTE — Progress Notes (Signed)
 Virtual Visit via telephone Note Due to COVID-19, visit is conducted virtually and was requested by patient. This visit type was conducted due to national recommendations for restrictions regarding the COVID-19 Pandemic (e.g. social distancing) in an effort to limit this patient's exposure and mitigate transmission in our community. All issues noted in this document were discussed and addressed.  A physical exam was not performed with this format.   I connected with Stacey Brown and her daughter on 08/19/18 at 1205 by telephone and verified that I am speaking with the correct person using two identifiers. Stacey Brown is currently located at home and family is currently with them during visit. The provider, [name] , FNP is located in their office at time of visit.  I discussed the limitations, risks, security and privacy concerns of performing an evaluation and management service by telephone and the availability of in person appointments. I also discussed with the patient that there may be a patient responsible charge related to this service. The patient expressed understanding and agreed to proceed.  Subjective:  Patient ID: Stacey Brown, female    DOB: 10/05/1922, 85 y.o.   MRN: [id number]  Chief Complaint:  Diarrhea   HPI: Stacey Brown is a 85 y.o. female presenting on 08/19/2018 for Diarrhea   Pt reports diarrhea that started yesterday. States she had 4 watery stools yesterday and 2 today. She denies abdominal pain, weakness, confusion, fever, chills, or sick contacts. No recent antibiotic use. She does take Linzess daily for constipation. She denies hematochezia or melena.   Diarrhea  This is a new problem. The current episode started yesterday. The problem occurs 2 to 4 times per day. The problem has been unchanged. The stool consistency is described as watery. Pertinent negatives include no abdominal pain, arthralgias, bloating, chills, coughing, fever, headaches, increased   flatus, myalgias, sweats, URI or vomiting. She has tried bismuth subsalicylate for the symptoms. The treatment provided no relief. Her past medical history is significant for irritable bowel syndrome.     Relevant past medical, surgical, family, and social history reviewed and updated as indicated.  Allergies and medications reviewed and updated.   Past Medical History:  Diagnosis Date  . Abnormal liver function test   . Allergy    allergic  rhinitis  . Arthritis   . ASCVD (arteriosclerotic cardiovascular disease)   . Atrial fibrillation (HCC)   . Cataract   . Chronic anticoagulation   . Chronic kidney disease   . Diabetes mellitus without complication (HCC)   . Esophageal stricture   . GERD (gastroesophageal reflux disease)   . Glaucoma   . H/O TB (tuberculosis)   . History of DVT of lower extremity   . Hyperlipidemia   . Macular degeneration   . Osteoporosis   . Primary biliary cirrhosis (HCC)   . Shingles   . Swallowing difficulty     Past Surgical History:  Procedure Laterality Date  . ABDOMINAL HYSTERECTOMY    . CHOLECYSTECTOMY    . HERNIA REPAIR    . Herniography    . Left breast biopsy    . LIVER BIOPSY    . MINOR HEMORRHOIDECTOMY    . Right hip replacement    . Right lung biopsy      Social History   Socioeconomic History  . Marital status: Widowed    Spouse name: Not on file  . Number of children: 1  . Years of education: Not on file  . Highest education   level: Not on file  Occupational History  . Occupation: Retired    Comment: Disabled  Social Needs  . Financial resource strain: Not hard at all  . Food insecurity    Worry: Never true    Inability: Never true  . Transportation needs    Medical: No    Non-medical: No  Tobacco Use  . Smoking status: Never Smoker  . Smokeless tobacco: Never Used  Substance and Sexual Activity  . Alcohol use: No  . Drug use: No  . Sexual activity: Never  Lifestyle  . Physical activity    Days per week: 0  days    Minutes per session: Not on file  . Stress: Not at all  Relationships  . Social connections    Talks on phone: More than three times a week    Gets together: More than three times a week    Attends religious service: Not on file    Active member of club or organization: Not on file    Attends meetings of clubs or organizations: Not on file    Relationship status: Not on file  . Intimate partner violence    Fear of current or ex partner: Not on file    Emotionally abused: Not on file    Physically abused: Not on file    Forced sexual activity: Not on file  Other Topics Concern  . Not on file  Social History Narrative   Daily caffeine Use    Outpatient Encounter Medications as of 08/19/2018  Medication Sig  . ACCU-CHEK AVIVA PLUS test strip USE UP TO 3 TIMES A DAY AS DIRECTED.  . amLODipine (NORVASC) 5 MG tablet Take 1 tablet (5 mg total) by mouth daily.  . apixaban (ELIQUIS) 2.5 MG TABS tablet TAKE (1) TABLET TWICE A DAY.  . azelastine (ASTELIN) 0.1 % nasal spray Place 2 sprays into both nostrils 2 (two) times daily. Use in each nostril as directed  . blood glucose meter kit and supplies Dispense based on patient and insurance preference. Use up to four times daily as directed. (FOR ICD-9 250.00, 250.01).  . carboxymethylcellulose (REFRESH PLUS) 0.5 % SOLN 1 drop 3 (three) times daily as needed.  . chlorhexidine (HIBICLENS) 4 % external liquid Apply topically daily as needed.  . cromolyn (OPTICROM) 4 % ophthalmic solution   . hydrochlorothiazide (MICROZIDE) 12.5 MG capsule TAKE (1) CAPSULE DAILY  . LINZESS 290 MCG CAPS capsule TAKE (1) CAPSULE DAILY  . loperamide (IMODIUM) 2 MG capsule Take 1 capsule (2 mg total) by mouth as needed for diarrhea or loose stools.  . loratadine (CLARITIN) 10 MG tablet Take 1 tablet (10 mg total) by mouth daily.  . Menthol-Zinc Oxide (CALMOSEPTINE) 0.44-20.6 % OINT Use BID on bottom and as needed  . mirtazapine (REMERON) 7.5 MG tablet Take 1  tablet (7.5 mg total) by mouth at bedtime.  . Misc. Devices (CAREX COCCYX CUSHION) MISC UAD for pressure sores/ offset weight when seated  . Misc. Devices (RAISED TOILET SEAT) MISC Use as directed.  Dx:  High fall risk R 29.6 and osteoporosis M 81.0  . mupirocin ointment (BACTROBAN) 2 % Apply 1 application topically 2 (two) times daily.  . nebivolol (BYSTOLIC) 5 MG tablet Take 1 tablet (5 mg total) by mouth daily.  . nystatin cream (MYCOSTATIN) Apply 1 application topically 2 (two) times daily.  . olmesartan (BENICAR) 20 MG tablet Take 1 tablet (20 mg total) by mouth daily.  . polyethylene glycol powder (GLYCOLAX/MIRALAX) powder Take   17 g by mouth daily.  . STARCH-MALTO DEXTRIN (THICK-IT) POWD Take by mouth.  . travoprost, benzalkonium, (TRAVATAN) 0.004 % ophthalmic solution Place 1 drop into both eyes at bedtime.  . [DISCONTINUED] fexofenadine (ALLEGRA) 180 MG tablet Take 180 mg by mouth daily.   No facility-administered encounter medications on file as of 08/19/2018.     Allergies  Allergen Reactions  . Dexilant [Dexlansoprazole]     Throat swells   . Ace Inhibitors Cough    Review of Systems  Constitutional: Negative for activity change, appetite change, chills, diaphoresis, fatigue, fever and unexpected weight change.  Respiratory: Negative for cough and shortness of breath.   Cardiovascular: Negative for chest pain and palpitations.  Gastrointestinal: Positive for diarrhea. Negative for abdominal distention, abdominal pain, anal bleeding, bloating, blood in stool, constipation, flatus, nausea, rectal pain and vomiting.  Genitourinary: Negative for decreased urine volume and difficulty urinating.  Musculoskeletal: Negative for arthralgias and myalgias.  Neurological: Negative for dizziness, syncope, weakness, light-headedness and headaches.  Psychiatric/Behavioral: Negative for confusion.  All other systems reviewed and are negative.        Observations/Objective: No vital  signs or physical exam, this was a telephone or virtual health encounter.  Pt alert and oriented, answers all questions appropriately, and able to speak in full sentences.    Assessment and Plan: Zan was seen today for diarrhea.  Diagnoses and all orders for this visit:  Diarrhea in adult patient Diarrhea likely due to Linzess use. Will hold until diarrhea resolves. No hematochezia or melena. Not suspicious for infectious colitis due to lack of exposure and no recent antibiotic use. Symptomatic care discussed. BRAT diet. Adequate hydration. Imodium as prescribed. If diarrhea continues after 2 days, will need evaluation and testing (stool, CMP, CBC). Pt and daughter aware of symptoms that warrant emergent evaluation.  -     loperamide (IMODIUM) 2 MG capsule; Take 1 capsule (2 mg total) by mouth as needed for diarrhea or loose stools.     Follow Up Instructions: Return if symptoms worsen or fail to improve.    I discussed the assessment and treatment plan with the patient. The patient was provided an opportunity to ask questions and all were answered. The patient agreed with the plan and demonstrated an understanding of the instructions.   The patient was advised to call back or seek an in-person evaluation if the symptoms worsen or if the condition fails to improve as anticipated.  The above assessment and management plan was discussed with the patient. The patient verbalized understanding of and has agreed to the management plan. Patient is aware to call the clinic if symptoms persist or worsen. Patient is aware when to return to the clinic for a follow-up visit. Patient educated on when it is appropriate to go to the emergency department.    I provided 15 minutes of non-face-to-face time during this encounter. The call started at 1205. The call ended at 1220. The other time was used for coordination of care.    [name] , FNP-C [facility] [address] [place], [place] [address] [phone]    

## 2018-08-29 ENCOUNTER — Other Ambulatory Visit: Payer: Self-pay | Admitting: Family Medicine

## 2018-10-27 ENCOUNTER — Other Ambulatory Visit: Payer: Self-pay | Admitting: Family Medicine

## 2018-11-08 ENCOUNTER — Ambulatory Visit (HOSPITAL_COMMUNITY)
Admission: RE | Admit: 2018-11-08 | Discharge: 2018-11-08 | Disposition: A | Discharge status: Home / Self Care | Payer: Medicare Other | Source: Ambulatory Visit | Attending: Family | Admitting: Family

## 2018-11-08 ENCOUNTER — Ambulatory Visit (INDEPENDENT_AMBULATORY_CARE_PROVIDER_SITE_OTHER): Payer: Medicare Other

## 2018-11-08 ENCOUNTER — Ambulatory Visit (INDEPENDENT_AMBULATORY_CARE_PROVIDER_SITE_OTHER): Payer: Medicare Other | Admitting: Family

## 2018-11-08 ENCOUNTER — Encounter: Payer: Self-pay | Admitting: Family

## 2018-11-08 ENCOUNTER — Other Ambulatory Visit: Payer: Self-pay

## 2018-11-08 VITALS — BP 129/60 | HR 72 | Temp 96.5°F | Ht 62.0 in

## 2018-11-08 DIAGNOSIS — Z96641 Presence of right artificial hip joint: Secondary | ICD-10-CM | POA: Diagnosis not present

## 2018-11-08 DIAGNOSIS — Z9181 History of falling: Secondary | ICD-10-CM | POA: Diagnosis not present

## 2018-11-08 DIAGNOSIS — M25551 Pain in right hip: Secondary | ICD-10-CM

## 2018-11-08 DIAGNOSIS — S0990XA Unspecified injury of head, initial encounter: Secondary | ICD-10-CM

## 2018-11-08 DIAGNOSIS — W19XXXA Unspecified fall, initial encounter: Secondary | ICD-10-CM | POA: Insufficient documentation

## 2018-11-08 DIAGNOSIS — Y92009 Unspecified place in unspecified non-institutional (private) residence as the place of occurrence of the external cause: Secondary | ICD-10-CM | POA: Diagnosis not present

## 2018-11-08 DIAGNOSIS — Z7901 Long term (current) use of anticoagulants: Secondary | ICD-10-CM | POA: Diagnosis not present

## 2018-11-08 DIAGNOSIS — S81819A Laceration without foreign body, unspecified lower leg, initial encounter: Secondary | ICD-10-CM

## 2018-11-08 DIAGNOSIS — S79911A Unspecified injury of right hip, initial encounter: Secondary | ICD-10-CM | POA: Diagnosis not present

## 2018-11-08 DIAGNOSIS — I6782 Cerebral ischemia: Secondary | ICD-10-CM | POA: Insufficient documentation

## 2018-11-08 NOTE — Progress Notes (Signed)
Subjective:    Patient ID: Stacey Brown, female    DOB: 1922/08/17, 85 y.o.   MRN: [id number]  Chief Complaint  Patient presents with  . fell in hall before entering bathroom    skin tears on legs, right hip pain, bruising   PT presents to the office today to be evaluated for a fall. She was walking to the restroom with her walker. She states she fell backwards and landed on the hallway floor. States she hit her head against the wall, her right hip against the floor. She states the walker landed on her bilateral legs. She is currently on Eliquis for A fib and hx dvt.  Fall The accident occurred 3 to 6 hours ago. The fall occurred while standing. There was no blood loss. The point of impact was the right hip. The pain is present in the right hip. The pain is at a severity of 7/10. The pain is mild. The symptoms are aggravated by standing. Pertinent negatives include no bowel incontinence, fever, headaches, hematuria, loss of consciousness, nausea, tingling, visual change or vomiting. She has tried nothing for the symptoms. The treatment provided no relief.      Review of Systems  Constitutional: Negative for fever.  Gastrointestinal: Negative for bowel incontinence, nausea and vomiting.  Genitourinary: Negative for hematuria.  Neurological: Negative for tingling, loss of consciousness and headaches.  All other systems reviewed and are negative.      Objective:   Physical Exam Vitals signs reviewed.  Constitutional:      General: She is not in acute distress.    Appearance: She is well-developed.  HENT:     Head: Normocephalic and atraumatic.     Left Ear: There is impacted cerumen.  Eyes:     Comments: Left eye blindness  Neck:     Musculoskeletal: Normal range of motion and neck supple.     Thyroid: No thyromegaly.  Cardiovascular:     Rate and Rhythm: Normal rate and regular rhythm.     Heart sounds: Murmur present.  Pulmonary:     Effort: Pulmonary effort is normal.  No respiratory distress.     Breath sounds: Decreased breath sounds present. No wheezing.  Musculoskeletal:        General: Tenderness present.     Comments: Pain in wheelchair, pain with flexion, extension, and rotation of right hip  Skin:    General: Skin is warm and dry.  Neurological:     Mental Status: She is alert and oriented to person, place, and time.     Cranial Nerves: No cranial nerve deficit.     Deep Tendon Reflexes: Reflexes are normal and symmetric.  Psychiatric:        Behavior: Behavior normal.        Thought Content: Thought content normal.        Judgment: Judgment normal.    X-ray- Negative for acute fracture. Preliminary reading by Evelina Dun, FNP [facility]  Skin tear cleaned and Vaseline gauze applied.   BP 129/60   Pulse 72   Temp (!) 96.5 F (35.8 C) (Temporal)   Ht 5\' 2"  (1.575 m)   SpO2 95%   BMI 18.03 kg/m      Assessment & Plan:  Stacey Brown comes in today with chief complaint of fell in hall before entering bathroom (skin tears on legs, right hip pain, bruising)   Diagnosis and orders addressed:  1. Pain of right hip joint - DG HIP UNILAT W  OR W/O PELVIS 2-3 VIEWS RIGHT; Future - CT Head Wo Contrast; Future  2. Fall in home, initial encounter - DG HIP UNILAT W OR W/O PELVIS 2-3 VIEWS RIGHT; Future - CT Head Wo Contrast; Future  3. History of right hip replacement - DG HIP UNILAT W OR W/O PELVIS 2-3 VIEWS RIGHT; Future - CT Head Wo Contrast; Future  4. Injury of head, initial encounter - CT Head Wo Contrast; Future  5. Chronic anticoagulation - CT Head Wo Contrast; Future  6. At high risk for falls - CT Head Wo Contrast; Future  7. Noninfected skin tear of lower extremity, unspecified laterality, initial encounter Keep clean and dry  Rest Ice Tylenol as needed CT head pending to rule out bleeding since she is taking Eliquis  If any change in gait, speech, or vision go to ED Fall preventions discussed   Evelina Dun, FNP

## 2018-11-14 ENCOUNTER — Other Ambulatory Visit: Payer: Self-pay | Admitting: Family Medicine

## 2018-11-14 DIAGNOSIS — J309 Allergic rhinitis, unspecified: Secondary | ICD-10-CM

## 2018-11-18 DIAGNOSIS — Z029 Encounter for administrative examinations, unspecified: Secondary | ICD-10-CM

## 2018-11-29 ENCOUNTER — Other Ambulatory Visit: Payer: Self-pay | Admitting: Family Medicine

## 2018-12-07 ENCOUNTER — Other Ambulatory Visit: Payer: Self-pay | Admitting: Family Medicine

## 2018-12-07 DIAGNOSIS — R630 Anorexia: Secondary | ICD-10-CM

## 2019-01-24 ENCOUNTER — Other Ambulatory Visit: Payer: Self-pay | Admitting: Family Medicine

## 2019-01-24 DIAGNOSIS — I10 Essential (primary) hypertension: Secondary | ICD-10-CM

## 2019-01-25 ENCOUNTER — Other Ambulatory Visit: Payer: Self-pay | Admitting: Family Medicine

## 2019-01-25 DIAGNOSIS — I10 Essential (primary) hypertension: Secondary | ICD-10-CM

## 2019-03-08 ENCOUNTER — Other Ambulatory Visit: Payer: Self-pay | Admitting: Family Medicine

## 2019-03-08 DIAGNOSIS — R630 Anorexia: Secondary | ICD-10-CM

## 2019-03-09 NOTE — Telephone Encounter (Signed)
rx refill Last seen 06/01/2018 with you Had labs 03/02/19 Ok to refill?

## 2019-04-04 DIAGNOSIS — H401133 Primary open-angle glaucoma, bilateral, severe stage: Secondary | ICD-10-CM | POA: Diagnosis not present

## 2019-04-04 DIAGNOSIS — H353133 Nonexudative age-related macular degeneration, bilateral, advanced atrophic without subfoveal involvement: Secondary | ICD-10-CM | POA: Diagnosis not present

## 2019-04-04 DIAGNOSIS — H16223 Keratoconjunctivitis sicca, not specified as Sjogren's, bilateral: Secondary | ICD-10-CM | POA: Diagnosis not present

## 2019-04-04 DIAGNOSIS — Z961 Presence of intraocular lens: Secondary | ICD-10-CM | POA: Diagnosis not present

## 2019-04-04 DIAGNOSIS — E119 Type 2 diabetes mellitus without complications: Secondary | ICD-10-CM | POA: Diagnosis not present

## 2019-04-04 LAB — HM DIABETES EYE EXAM

## 2019-04-20 ENCOUNTER — Other Ambulatory Visit: Payer: Self-pay | Admitting: Family Medicine

## 2019-04-20 DIAGNOSIS — I10 Essential (primary) hypertension: Secondary | ICD-10-CM

## 2019-04-20 DIAGNOSIS — J309 Allergic rhinitis, unspecified: Secondary | ICD-10-CM

## 2019-04-21 NOTE — Telephone Encounter (Signed)
OV 06/2019

## 2019-05-23 ENCOUNTER — Other Ambulatory Visit: Payer: Self-pay | Admitting: Family Medicine

## 2019-05-23 DIAGNOSIS — J309 Allergic rhinitis, unspecified: Secondary | ICD-10-CM

## 2019-06-12 ENCOUNTER — Other Ambulatory Visit: Payer: Self-pay | Admitting: Family Medicine

## 2019-06-19 ENCOUNTER — Other Ambulatory Visit: Payer: Self-pay | Admitting: Family Medicine

## 2019-06-19 DIAGNOSIS — J309 Allergic rhinitis, unspecified: Secondary | ICD-10-CM

## 2019-06-19 DIAGNOSIS — R630 Anorexia: Secondary | ICD-10-CM

## 2019-07-03 DIAGNOSIS — H353133 Nonexudative age-related macular degeneration, bilateral, advanced atrophic without subfoveal involvement: Secondary | ICD-10-CM | POA: Diagnosis not present

## 2019-07-03 DIAGNOSIS — H401113 Primary open-angle glaucoma, right eye, severe stage: Secondary | ICD-10-CM | POA: Diagnosis not present

## 2019-07-06 NOTE — Progress Notes (Signed)
Subjective: CC: Weight check PCP: Stacey Norlander, DO GBT:DVVO Stacey Brown is a 86 y.o. female presenting to clinic today for:  1.  Weight check/malnutrition/diabetes/A. fib Patient continues to only eat what she wants.  Her daughter reports that she is still quite picky.  She has been encouraging protein shakes.  She is been given [place] yogurt.  She is drinking juice but does not enjoy drinking water much.  Blood sugars have been ranging anywhere between the 150s to 200s.  Patient is legally blind.  Does not report any chest pain or shortness of breath.  There is a concern for some sores on her buttocks and some swelling along her rear-ended that she is worried about.  She is had some pedal edema but it is actually looking better today than it has been.  She has got some bruising on her shins.  Of note she is anticoagulated with Eliquis.  She has known atrial fibrillation  She would like to establish care with a podiatrist.  ROS: Per HPI  Allergies  Allergen Reactions  . Dexilant [Dexlansoprazole]     Throat swells   . Ace Inhibitors Cough   Past Medical History:  Diagnosis Date  . Abnormal liver function test   . Allergy    allergic  rhinitis  . Arthritis   . ASCVD (arteriosclerotic cardiovascular disease)   . Atrial fibrillation ([place])   . Cataract   . Chronic anticoagulation   . Chronic kidney disease   . Diabetes mellitus without complication ([place])   . Esophageal stricture   . GERD (gastroesophageal reflux disease)   . Glaucoma   . H/O TB (tuberculosis)   . History of DVT of lower extremity   . Hyperlipidemia   . Macular degeneration   . Osteoporosis   . Primary biliary cirrhosis ([place])   . Shingles   . Swallowing difficulty     Current Outpatient Medications:  .  ACCU-CHEK AVIVA PLUS test strip, USE UP TO 3 TIMES A DAY AS DIRECTED., Disp: 100 each, Rfl: 2 .  amLODipine (NORVASC) 5 MG tablet, Take 1 tablet (5 mg total) by mouth daily., Disp: 90 tablet, Rfl: 0 .   azelastine (ASTELIN) 0.1 % nasal spray, Place 2 sprays into both nostrils 2 (two) times daily. Use in each nostril as directed, Disp: 30 mL, Rfl: 12 .  blood glucose meter kit and supplies, Dispense based on patient and insurance preference. Use up to four times daily as directed. (FOR ICD-9 250.00, 250.01)., Disp: 1 each, Rfl: 0 .  BYSTOLIC 5 MG tablet, TAKE 1 TABLET EVERY DAY, Disp: 90 tablet, Rfl: 0 .  carboxymethylcellulose (REFRESH PLUS) 0.5 % SOLN, 1 drop 3 (three) times daily as needed., Disp: , Rfl:  .  chlorhexidine (HIBICLENS) 4 % external liquid, Apply topically daily as needed., Disp: 120 mL, Rfl: 0 .  cromolyn (OPTICROM) 4 % ophthalmic solution, , Disp: , Rfl:  .  ELIQUIS 2.5 MG TABS tablet, TAKE (1) TABLET TWICE A DAY., Disp: 180 tablet, Rfl: 0 .  hydrochlorothiazide (MICROZIDE) 12.5 MG capsule, TAKE (1) CAPSULE DAILY, Disp: 90 capsule, Rfl: 0 .  LINZESS 290 MCG CAPS capsule, TAKE (1) CAPSULE DAILY, Disp: 90 capsule, Rfl: 0 .  loperamide (IMODIUM) 2 MG capsule, Take 1 capsule (2 mg total) by mouth as needed for diarrhea or loose stools., Disp: 30 capsule, Rfl: 0 .  loratadine (CLARITIN) 10 MG tablet, TAKE 1 TABLET ONCE DAILY, Disp: 30 tablet, Rfl: 0 .  Menthol-Zinc Oxide (CALMOSEPTINE)  0.44-20.6 % OINT, Use BID on bottom and as needed, Disp: 71 g, Rfl: 0 .  mirtazapine (REMERON) 7.5 MG tablet, Take 1 tablet (7.5 mg total) by mouth at bedtime., Disp: 90 tablet, Rfl: 0 .  Misc. Devices (CAREX COCCYX CUSHION) MISC, UAD for pressure sores/ offset weight when seated, Disp: 1 each, Rfl: 0 .  Misc. Devices (RAISED TOILET SEAT) MISC, Use as directed.  Dx:  High fall risk R 29.6 and osteoporosis M 81.0, Disp: 1 each, Rfl: 0 .  mupirocin ointment (BACTROBAN) 2 %, Apply 1 application topically 2 (two) times daily., Disp: 22 g, Rfl: 0 .  nystatin cream (MYCOSTATIN), Apply 1 application topically 2 (two) times daily., Disp: 30 g, Rfl: 2 .  olmesartan (BENICAR) 20 MG tablet, TAKE 1 TABLET EVERY DAY,  Disp: 90 tablet, Rfl: 0 .  polyethylene glycol powder (GLYCOLAX/MIRALAX) powder, Take 17 g by mouth daily., Disp: 3350 g, Rfl: 1 .  STARCH-MALTO DEXTRIN (THICK-IT) POWD, Take by mouth., Disp: , Rfl:  .  travoprost, benzalkonium, (TRAVATAN) 0.004 % ophthalmic solution, Place 1 drop into both eyes at bedtime., Disp: , Rfl:  Social History   Socioeconomic History  . Marital status: Widowed    Spouse name: Not on file  . Number of children: 1  . Years of education: Not on file  . Highest education level: Not on file  Occupational History  . Occupation: Retired    Comment: Disabled  Tobacco Use  . Smoking status: Never Smoker  . Smokeless tobacco: Never Used  Substance and Sexual Activity  . Alcohol use: No  . Drug use: No  . Sexual activity: Never  Other Topics Concern  . Not on file  Social History Narrative   Daily caffeine Use   Social Determinants of Health   Financial Resource Strain:   . Difficulty of Paying Living Expenses:   Food Insecurity:   . Worried About Charity fundraiser in the Last Year:   . Arboriculturist in the Last Year:   Transportation Needs:   . Film/video editor (Medical):   Marland Kitchen Lack of Transportation (Non-Medical):   Physical Activity:   . Days of Exercise per Week:   . Minutes of Exercise per Session:   Stress:   . Feeling of Stress :   Social Connections:   . Frequency of Communication with Friends and Family:   . Frequency of Social Gatherings with Friends and Family:   . Attends Religious Services:   . Active Member of Clubs or Organizations:   . Attends Archivist Meetings:   Marland Kitchen Marital Status:   Intimate Partner Violence:   . Fear of Current or Ex-Partner:   . Emotionally Abused:   Marland Kitchen Physically Abused:   . Sexually Abused:    Family History  Problem Relation Age of Onset  . Breast cancer Other        Neice  . Heart disease Mother   . Diabetes Mother        Brother,  . Heart disease Brother   . Kidney disease Cousin    . Cancer Father   . Osteoporosis Sister   . Hyperlipidemia Daughter   . Diabetes Daughter   . Diabetes Brother   . Thyroid disease Brother   . Cirrhosis Other        Nephew  . Colon cancer Neg Hx     Objective: Office vital signs reviewed. BP 130/60   Pulse 62   Temp (!) 97.1  F (36.2 C)   Ht '5\' 2"'  (1.575 m)   Wt 96 lb 3.2 oz (43.6 kg)   SpO2 99%   BMI 17.60 kg/m   Physical Examination:  General: Awake, alert, mal nourished, frail appearing elderly female no acute distress Cardio: Irregularly irregular with rate control, S1S2 heard, no murmurs appreciated Pulm: clear to auscultation bilaterally, no wheezes, rhonchi or rales; normal work of breathing on room air Extremities: Cool, mild erythema noted along the ankles bilaterally.  No increased warmth.  1+ pitting edema to the ankles.no cyanosis or clubbing; +1 pulses bilaterally MSK: Arrives in wheelchair.  Totally dependent for standing and transfers Skin: She has +1 pitting sacral edema that seems to be dependent on where her bottom is sitting in the chair.  There is a stage II sacral ulcer noted at the apex of the gluteal cleft.  No evidence of secondary infection.  She has 2 abrasions noted along the right posterior hip that are nonbleeding and do not appear infected.  She has 2 areas of ecchymosis along bilateral anterior shins without associated skin breakdown or ulcerations. Neuro: Answers questions appropriately.  Legally blind.  Hard of hearing.  Assessment/ Plan: 86 y.o. female   1. Controlled other specified diabetes mellitus with other specified complication, without long-term current use of insulin ([place]) Diet controlled.  I told her daughter to allow her to eat ad lib. given age and frailty I do not think that the risk of her becoming hypoglycemic outweighs the benefits of gaining some weight and putting some calories into her body - Bayer DCA Hb A1c Waived - Lipid Panel - Ambulatory referral to Podiatry  2.  Hypertension associated with diabetes ([place]) Recheck shows controlled blood pressure - CMP14+EGFR - Lipid Panel  3. CKD stage 3 secondary to diabetes (HCC) - CMP14+EGFR  4. Mild protein-calorie malnutrition ([place]) As above - CMP14+EGFR - Ambulatory referral to Podiatry  5. Atrial fibrillation, unspecified type ([place]) Rate controlled - TSH - CBC  6. Chronic anticoagulation Small hemostatic ecchymosis noted along the anterior shins bilaterally - CBC  7. Pressure injury of sacral region, stage 2 (HCC) No evidence of secondary infection  8. Onychomycosis Referral to podiatry for nail care - Ambulatory referral to Podiatry   No orders of the defined types were placed in this encounter.  No orders of the defined types were placed in this encounter.    Stacey Norlander, DO [place] [phone]

## 2019-07-07 ENCOUNTER — Encounter: Payer: Self-pay | Admitting: Family Medicine

## 2019-07-07 ENCOUNTER — Ambulatory Visit (INDEPENDENT_AMBULATORY_CARE_PROVIDER_SITE_OTHER): Payer: Medicare Other | Admitting: Family Medicine

## 2019-07-07 ENCOUNTER — Other Ambulatory Visit: Payer: Self-pay

## 2019-07-07 VITALS — BP 130/60 | HR 62 | Temp 97.1°F | Ht 62.0 in | Wt 96.2 lb

## 2019-07-07 DIAGNOSIS — I4891 Unspecified atrial fibrillation: Secondary | ICD-10-CM

## 2019-07-07 DIAGNOSIS — E441 Mild protein-calorie malnutrition: Secondary | ICD-10-CM | POA: Diagnosis not present

## 2019-07-07 DIAGNOSIS — E1159 Type 2 diabetes mellitus with other circulatory complications: Secondary | ICD-10-CM

## 2019-07-07 DIAGNOSIS — N183 Chronic kidney disease, stage 3 unspecified: Secondary | ICD-10-CM

## 2019-07-07 DIAGNOSIS — I10 Essential (primary) hypertension: Secondary | ICD-10-CM | POA: Diagnosis not present

## 2019-07-07 DIAGNOSIS — E1369 Other specified diabetes mellitus with other specified complication: Secondary | ICD-10-CM | POA: Diagnosis not present

## 2019-07-07 DIAGNOSIS — Z7901 Long term (current) use of anticoagulants: Secondary | ICD-10-CM

## 2019-07-07 DIAGNOSIS — E1122 Type 2 diabetes mellitus with diabetic chronic kidney disease: Secondary | ICD-10-CM

## 2019-07-07 DIAGNOSIS — B351 Tinea unguium: Secondary | ICD-10-CM

## 2019-07-07 DIAGNOSIS — L89152 Pressure ulcer of sacral region, stage 2: Secondary | ICD-10-CM

## 2019-07-07 LAB — BAYER DCA HB A1C WAIVED: HB A1C (BAYER DCA - WAIVED): 6.7 % (ref <7.0)

## 2019-07-07 NOTE — Patient Instructions (Signed)
Let her eat what she wants.  Try to encourage protein.  Referral to DrDrake placed.

## 2019-07-08 LAB — CBC
Hematocrit: 39.6 % (ref 34.0–46.6)
Hemoglobin: 13 g/dL (ref 11.1–15.9)
MCH: 30.7 pg (ref 26.6–33.0)
MCHC: 32.8 g/dL (ref 31.5–35.7)
MCV: 94 fL (ref 79–97)
Platelets: 155 10*3/uL (ref 150–450)
RBC: 4.23 x10E6/uL (ref 3.77–5.28)
RDW: 13.6 % (ref 11.7–15.4)
WBC: 7.5 10*3/uL (ref 3.4–10.8)

## 2019-07-08 LAB — CMP14+EGFR
ALT: 7 IU/L (ref 0–32)
AST: 20 IU/L (ref 0–40)
Albumin/Globulin Ratio: 1.4 (ref 1.2–2.2)
Albumin: 4.2 g/dL (ref 3.5–4.6)
Alkaline Phosphatase: 93 IU/L (ref 48–121)
BUN/Creatinine Ratio: 25 (ref 12–28)
BUN: 31 mg/dL (ref 10–36)
Bilirubin Total: 0.4 mg/dL (ref 0.0–1.2)
CO2: 23 mmol/L (ref 20–29)
Calcium: 9.6 mg/dL (ref 8.7–10.3)
Chloride: 101 mmol/L (ref 96–106)
Creatinine, Ser: 1.23 mg/dL — ABNORMAL HIGH (ref 0.57–1.00)
GFR calc Af Amer: 43 mL/min/{1.73_m2} — ABNORMAL LOW (ref >59)
GFR calc non Af Amer: 38 mL/min/{1.73_m2} — ABNORMAL LOW (ref >59)
Globulin, Total: 3 g/dL (ref 1.5–4.5)
Glucose: 132 mg/dL — ABNORMAL HIGH (ref 65–99)
Potassium: 4.3 mmol/L (ref 3.5–5.2)
Sodium: 140 mmol/L (ref 134–144)
Total Protein: 7.2 g/dL (ref 6.0–8.5)

## 2019-07-08 LAB — LIPID PANEL
Chol/HDL Ratio: 4.6 ratio — ABNORMAL HIGH (ref 0.0–4.4)
Cholesterol, Total: 224 mg/dL — ABNORMAL HIGH (ref 100–199)
HDL: 49 mg/dL (ref >39)
LDL Chol Calc (NIH): 153 mg/dL — ABNORMAL HIGH (ref 0–99)
Triglycerides: 122 mg/dL (ref 0–149)
VLDL Cholesterol Cal: 22 mg/dL (ref 5–40)

## 2019-07-08 LAB — TSH: TSH: 5.07 u[IU]/mL — ABNORMAL HIGH (ref 0.450–4.500)

## 2019-07-11 ENCOUNTER — Telehealth: Payer: Self-pay | Admitting: Family Medicine

## 2019-07-11 NOTE — Progress Notes (Signed)
Lmtcb.

## 2019-07-19 ENCOUNTER — Other Ambulatory Visit: Payer: Self-pay | Admitting: Family Medicine

## 2019-07-19 DIAGNOSIS — J309 Allergic rhinitis, unspecified: Secondary | ICD-10-CM

## 2019-07-25 ENCOUNTER — Telehealth: Payer: Self-pay | Admitting: Family Medicine

## 2019-07-25 DIAGNOSIS — I70203 Unspecified atherosclerosis of native arteries of extremities, bilateral legs: Secondary | ICD-10-CM | POA: Diagnosis not present

## 2019-07-25 DIAGNOSIS — L84 Corns and callosities: Secondary | ICD-10-CM | POA: Diagnosis not present

## 2019-07-25 DIAGNOSIS — B351 Tinea unguium: Secondary | ICD-10-CM | POA: Diagnosis not present

## 2019-07-25 DIAGNOSIS — M79676 Pain in unspecified toe(s): Secondary | ICD-10-CM | POA: Diagnosis not present

## 2019-07-26 ENCOUNTER — Other Ambulatory Visit: Payer: Self-pay | Admitting: *Deleted

## 2019-07-26 DIAGNOSIS — I10 Essential (primary) hypertension: Secondary | ICD-10-CM

## 2019-07-26 MED ORDER — NEBIVOLOL HCL 5 MG PO TABS: 5.0000 mg | ORAL_TABLET | Freq: Every day | ORAL | 0 refills | Status: DC

## 2019-07-26 NOTE — Telephone Encounter (Signed)
Daughter wanted information about thyroid and what does it do .Discussed details and reviewed provider's notes.

## 2019-08-15 ENCOUNTER — Other Ambulatory Visit: Payer: Self-pay | Admitting: Family Medicine

## 2019-08-15 DIAGNOSIS — I10 Essential (primary) hypertension: Secondary | ICD-10-CM

## 2019-08-21 ENCOUNTER — Other Ambulatory Visit: Payer: Self-pay | Admitting: Family Medicine

## 2019-08-21 DIAGNOSIS — J309 Allergic rhinitis, unspecified: Secondary | ICD-10-CM

## 2019-09-06 ENCOUNTER — Other Ambulatory Visit: Payer: Self-pay

## 2019-09-06 ENCOUNTER — Ambulatory Visit (INDEPENDENT_AMBULATORY_CARE_PROVIDER_SITE_OTHER): Payer: Medicare Other | Admitting: Family Medicine

## 2019-09-06 VITALS — BP 109/65 | HR 80 | Temp 97.4°F | Ht 62.0 in | Wt 88.0 lb

## 2019-09-06 DIAGNOSIS — L570 Actinic keratosis: Secondary | ICD-10-CM | POA: Diagnosis not present

## 2019-09-06 DIAGNOSIS — I959 Hypotension, unspecified: Secondary | ICD-10-CM

## 2019-09-06 DIAGNOSIS — R5383 Other fatigue: Secondary | ICD-10-CM

## 2019-09-06 DIAGNOSIS — R6889 Other general symptoms and signs: Secondary | ICD-10-CM | POA: Diagnosis not present

## 2019-09-06 DIAGNOSIS — R7989 Other specified abnormal findings of blood chemistry: Secondary | ICD-10-CM | POA: Diagnosis not present

## 2019-09-06 DIAGNOSIS — I10 Essential (primary) hypertension: Secondary | ICD-10-CM | POA: Diagnosis not present

## 2019-09-06 MED ORDER — AMLODIPINE BESYLATE 5 MG PO TABS: 2.5000 mg | ORAL_TABLET | Freq: Every day | ORAL | 0 refills | Status: DC

## 2019-09-06 NOTE — Patient Instructions (Addendum)
Reduce Amlodipine to 1/2 tablet daily.  Return in 2 weeks for BP check.  Cryosurgery for Skin Conditions, Care After This sheet gives you information about how to care for yourself after your procedure. Your health care provider may also give you more specific instructions. If you have problems or questions, contact your health care provider. What can I expect after the procedure? After your procedure, it is common to have redness, swelling, and a blister that forms over the treated area. The blister may contain a small amount of blood. After about 2 weeks, the blister will break on its own, leaving a scab. Then the treated area will heal. After healing, there is usually little or no scarring. Follow these instructions at home: Caring for the treated area   Follow instructions from your health care provider about how to take care of the treated area. Make sure you: ? Keep the area covered with a bandage (dressing) until it heals, or for as long as told by your health care provider. ? Wash your hands with soap and water before you change your dressing. If soap and water are not available, use hand sanitizer. ? Change your dressing as told by your health care provider. ? Keep the dressing and the treated area clean and dry. If the dressing gets wet, change it right away. ? Clean the treated area with soap and water.  Check the treated area every day for signs of infection. Check for: ? More redness, swelling, or pain. ? More fluid or blood. ? Warmth. ? Pus or a bad smell. General instructions  Do not pick at your blister or try to break it open. This can cause infection and scarring.  Do not apply any medicine, cream, or lotion to the treated area unless directed by your health care provider.  Take over-the-counter and prescription medicines only as told by your health care provider.  Keep all follow-up visits as told by your health care provider. This is important. Contact a health  care provider if:  You have more redness, swelling, or pain around the treated area.  You have more fluid or blood coming from the treated area.  The treated area feels warm to the touch.  You have pus or a bad smell coming from the treated area.  Your blister becomes large and painful. Get help right away if:  You have a fever and have redness spreading from the treated area. Summary  The treated area will become red and swollen shortly after the procedure.  You should keep the treated area and your dressing clean and dry.  Check the treated area every day for signs of infection, such as fluid, pus, warmth, or having more redness, swelling, or pain.  Do not pick at your blister or try to break it open. This information is not intended to replace advice given to you by your health care provider. Make sure you discuss any questions you have with your health care provider. Document Revised: 01/08/2017 Document Reviewed: 12/16/2015 Elsevier Patient Education  [place]. Actinic Keratosis An actinic keratosis is a precancerous growth on the skin. If there is more than one growth, the condition is called actinic keratoses. Actinic keratoses appear most often on areas of skin that get a lot of sun exposure, including the scalp, face, ears, lips, upper back, forearms, and the backs of the hands. If left untreated, these growths may develop into a skin cancer called squamous cell carcinoma. It is important to have all  these growths checked by a health care provider to determine the best treatment approach. What are the causes? Actinic keratoses are caused by getting too much ultraviolet (UV) radiation from the sun or other UV light sources. What increases the risk? You are more likely to develop this condition if you:  Have light-colored skin and blue eyes.  Have blond or red hair.  Spend a lot of time in the sun.  Do not protect your skin from the sun when outdoors.  Are  an older person. The risk of developing an actinic keratosis increases with age. What are the signs or symptoms? Actinic keratoses feel like scaly, rough spots of skin. Symptoms of this condition include growths that may:  Be as small as a pinhead or as big as a quarter.  Itch, hurt, or feel sensitive.  Be skin-colored, light tan, dark tan, pink, or a combination of any of these colors. In most cases, the growths become red.  Have a small piece of pink or gray skin (skin tag) growing from them. It may be easier to notice actinic keratoses by feeling them, rather than seeing them. Sometimes, actinic keratoses disappear, but many reappear a few days to a few weeks later. How is this diagnosed? This condition is usually diagnosed with a physical exam.  A tissue sample may be removed from the actinic keratosis and examined under a microscope (biopsy). How is this treated? If needed, this condition may be treated by:  Scraping off the actinic keratosis (curettage).  Freezing the actinic keratosis with liquid nitrogen (cryosurgery). This causes the growth to eventually fall off the skin.  Applying medicated creams or gels to destroy the cells in the growth.  Applying chemicals to the actinic keratosis to make the outer layers of skin peel off (chemical peel).  Using photodynamic therapy. In this procedure, medicated cream is applied to the actinic keratosis. This cream increases your skin's sensitivity to light. Then, a strong light is aimed at the actinic keratosis to destroy cells in the growth. Follow these instructions at home: Skin care  Apply cool, wet cloths (cool compresses) to the affected areas.  Do not scratch your skin.  Check your skin regularly for any growths, especially growths that: ? Start to itch or bleed. ? Change in size, shape, or color. Caring for the treated area  Keep the treated area clean and dry as told by your health care provider.  Do not apply any  medicine, cream, or lotion to the treated area unless your health care provider tells you to do that.  Do not pick at blisters or try to break them open. This can cause infection and scarring.  If you have red or irritated skin after treatment, follow instructions from your health care provider about how to take care of the treated area. Make sure you: ? Wash your hands with soap and water before you change your bandage (dressing). If soap and water are not available, use hand sanitizer. ? Change your dressing as told by your health care provider.  If you have red or irritated skin after treatment, check your treated area every day for signs of infection. Check for: ? Redness, swelling, or pain. ? Fluid or blood. ? Warmth. ? Pus or a bad smell. General instructions  Take or apply over-the-counter and prescription medicines only as told by your health care provider.  Return to your normal activities as told by your health care provider. Ask your health care provider what activities are  safe for you.  Have a skin exam done every year by a health care provider who is a skin specialist (dermatologist).  Keep all follow-up visits as told by your health care provider. This is important. Lifestyle  Do not use any products that contain nicotine or tobacco, such as cigarettes and e-cigarettes. If you need help quitting, ask your health care provider.  Take steps to protect your skin from the sun. ? Try to avoid the sun between 10:00 a.m. and 4:00 p.m. This is when the UV light is the strongest. ? Use a sunscreen or sunblock with SPF 30 (sun protection factor 30) or greater. ? Apply sunscreen before you are exposed to sunlight and reapply as often as directed by the instructions on the sunscreen container. ? Always wear sunglasses that have UV protection, and always wear a hat and clothing to protect your skin from sunlight. ? When possible, avoid medicines that increase your sensitivity to  sunlight. ? Do not use tanning beds or other indoor tanning devices. Contact a health care provider if:  You notice any changes or new growths on your skin.  You have swelling, pain, or more redness around your treated area.  You have fluid or blood coming from your treated area.  Your treated area feels warm to the touch.  You have pus or a bad smell coming from your treated area.  You have a fever.  You have a blister that becomes large and painful. Summary  An actinic keratosis is a precancerous growth on the skin. If there is more than one growth, the condition is called actinic keratoses. In some cases, if left untreated, these growths can develop into skin cancer.  Check your skin regularly for any growths, especially growths that start to itch or bleed, or change in size, shape, or color.  Take steps to protect your skin from the sun.  Contact a health care provider if you notice any changes or new growths on your skin.  Keep all follow-up visits as told by your health care provider. This is important. This information is not intended to replace advice given to you by your health care provider. Make sure you discuss any questions you have with your health care provider. Document Revised: 06/08/2017 Document Reviewed: 06/08/2017 Elsevier Patient Education  [place].

## 2019-09-06 NOTE — Progress Notes (Signed)
Subjective: CC: f/u TSH PCP: Janora Norlander, DO Stacey Brown is a 86 y.o. female presenting to clinic today for:  1. Abnormal TSH There is a known family history of thyroid disease in her brother and mother.  Patient has never had any surgery or radiation to the neck.  Does not report any tremor, heart palpitations.  She would like her B12 checked.  She does have low energy.  2.  Atrial fibrillation/hypertension Patient reports compliance with her medications including Eliquis.  Does not report any bleeding.  Her daughter does not monitor blood pressures closely because the current cuff is nonfunctional for the patient who has very small risks.  Patient does not report any dizziness.  She is compliant with her blood pressure medications.  No heart palpitations.  No falls.  3. Skin lesion Patient has a skin lesion along the left nasal bridge.  This has been present for greater than 2 years now.  It was treated with cryotherapy by her previous PCP a couple of years ago.  It seems to be getting a little bit larger.  Denies any spontaneous bleeding.  Has not seen dermatology   ROS: Per HPI  Allergies  Allergen Reactions  . Dexilant [Dexlansoprazole]     Throat swells   . Ace Inhibitors Cough   Past Medical History:  Diagnosis Date  . Abnormal liver function test   . Allergy    allergic  rhinitis  . Arthritis   . ASCVD (arteriosclerotic cardiovascular disease)   . Atrial fibrillation ([place])   . Cataract   . Chronic anticoagulation   . Chronic kidney disease   . Diabetes mellitus without complication ([place])   . Esophageal stricture   . GERD (gastroesophageal reflux disease)   . Glaucoma   . H/O TB (tuberculosis)   . History of DVT of lower extremity   . Hyperlipidemia   . Macular degeneration   . Osteoporosis   . Primary biliary cirrhosis ([place])   . Shingles   . Swallowing difficulty     Current Outpatient Medications:  .  ACCU-CHEK AVIVA PLUS test strip, USE UP  TO 3 TIMES A DAY AS DIRECTED., Disp: 100 each, Rfl: 2 .  amLODipine (NORVASC) 5 MG tablet, TAKE 1 TABLET ONCE DAILY, Disp: 90 tablet, Rfl: 0 .  azelastine (ASTELIN) 0.1 % nasal spray, Place 2 sprays into both nostrils 2 (two) times daily. Use in each nostril as directed, Disp: 30 mL, Rfl: 12 .  blood glucose meter kit and supplies, Dispense based on patient and insurance preference. Use up to four times daily as directed. (FOR ICD-9 250.00, 250.01)., Disp: 1 each, Rfl: 0 .  carboxymethylcellulose (REFRESH PLUS) 0.5 % SOLN, 1 drop 3 (three) times daily as needed., Disp: , Rfl:  .  chlorhexidine (HIBICLENS) 4 % external liquid, Apply topically daily as needed., Disp: 120 mL, Rfl: 0 .  cromolyn (OPTICROM) 4 % ophthalmic solution, , Disp: , Rfl:  .  ELIQUIS 2.5 MG TABS tablet, TAKE (1) TABLET TWICE A DAY., Disp: 180 tablet, Rfl: 0 .  hydrochlorothiazide (MICROZIDE) 12.5 MG capsule, TAKE (1) CAPSULE DAILY, Disp: 90 capsule, Rfl: 0 .  LINZESS 290 MCG CAPS capsule, TAKE (1) CAPSULE DAILY, Disp: 90 capsule, Rfl: 0 .  loperamide (IMODIUM) 2 MG capsule, Take 1 capsule (2 mg total) by mouth as needed for diarrhea or loose stools., Disp: 30 capsule, Rfl: 0 .  loratadine (CLARITIN) 10 MG tablet, TAKE 1 TABLET ONCE DAILY, Disp: 30 tablet, Rfl:  0 .  Menthol-Zinc Oxide (CALMOSEPTINE) 0.44-20.6 % OINT, Use BID on bottom and as needed, Disp: 71 g, Rfl: 0 .  mirtazapine (REMERON) 7.5 MG tablet, Take 1 tablet (7.5 mg total) by mouth at bedtime., Disp: 90 tablet, Rfl: 0 .  Misc. Devices (CAREX COCCYX CUSHION) MISC, UAD for pressure sores/ offset weight when seated, Disp: 1 each, Rfl: 0 .  Misc. Devices (RAISED TOILET SEAT) MISC, Use as directed.  Dx:  High fall risk R 29.6 and osteoporosis M 81.0, Disp: 1 each, Rfl: 0 .  mupirocin ointment (BACTROBAN) 2 %, Apply 1 application topically 2 (two) times daily., Disp: 22 g, Rfl: 0 .  nebivolol (BYSTOLIC) 5 MG tablet, Take 1 tablet (5 mg total) by mouth daily., Disp: 90  tablet, Rfl: 0 .  nystatin cream (MYCOSTATIN), Apply 1 application topically 2 (two) times daily., Disp: 30 g, Rfl: 2 .  olmesartan (BENICAR) 20 MG tablet, TAKE 1 TABLET EVERY DAY, Disp: 90 tablet, Rfl: 0 .  polyethylene glycol powder (GLYCOLAX/MIRALAX) powder, Take 17 g by mouth daily., Disp: 3350 g, Rfl: 1 .  STARCH-MALTO DEXTRIN (THICK-IT) POWD, Take by mouth., Disp: , Rfl:  .  travoprost, benzalkonium, (TRAVATAN) 0.004 % ophthalmic solution, Place 1 drop into both eyes at bedtime., Disp: , Rfl:  Social History   Socioeconomic History  . Marital status: Widowed    Spouse name: Not on file  . Number of children: 1  . Years of education: Not on file  . Highest education level: Not on file  Occupational History  . Occupation: Retired    Comment: Disabled  Tobacco Use  . Smoking status: Never Smoker  . Smokeless tobacco: Never Used  Vaping Use  . Vaping Use: Never used  Substance and Sexual Activity  . Alcohol use: No  . Drug use: No  . Sexual activity: Never  Other Topics Concern  . Not on file  Social History Narrative   Daily caffeine Use   Social Determinants of Health   Financial Resource Strain:   . Difficulty of Paying Living Expenses:   Food Insecurity:   . Worried About Charity fundraiser in the Last Year:   . Arboriculturist in the Last Year:   Transportation Needs:   . Film/video editor (Medical):   Marland Kitchen Lack of Transportation (Non-Medical):   Physical Activity:   . Days of Exercise per Week:   . Minutes of Exercise per Session:   Stress:   . Feeling of Stress :   Social Connections:   . Frequency of Communication with Friends and Family:   . Frequency of Social Gatherings with Friends and Family:   . Attends Religious Services:   . Active Member of Clubs or Organizations:   . Attends Archivist Meetings:   Marland Kitchen Marital Status:   Intimate Partner Violence:   . Fear of Current or Ex-Partner:   . Emotionally Abused:   Marland Kitchen Physically Abused:   .  Sexually Abused:    Family History  Problem Relation Age of Onset  . Breast cancer Other        Neice  . Heart disease Mother   . Diabetes Mother        Brother,  . Heart disease Brother   . Kidney disease Cousin   . Cancer Father   . Osteoporosis Sister   . Hyperlipidemia Daughter   . Diabetes Daughter   . Diabetes Brother   . Thyroid disease Brother   .  Cirrhosis Other        Nephew  . Colon cancer Neg Hx     Objective: Office vital signs reviewed. BP 109/65   Pulse 80   Temp (!) 97.4 F (36.3 C) (Temporal)   Ht '5\' 2"'  (1.575 m)   Wt (!) 88 lb (39.9 kg)   SpO2 99%   BMI 16.10 kg/m   Physical Examination:  General: Awake, alert, frail, thin, No acute distress HEENT: Normal, sclera white, MMM Cardio: regular rate and rhythm, S1S2 heard, no murmurs appreciated Pulm: clear to auscultation bilaterally, no wheezes, rhonchi or rales; normal work of breathing on room air Extremities: warm, well perfused, trace pedal edema, No cyanosis or clubbing; +2 pulses bilaterally MSK: antalgic, unsteady gait and hunched station w/ pronounced thoracic kyphosis.  Using walker for ambulation. Skin: scaly lesion noted along the left side of nasal bridge.   Cryotherapy Procedure:  Risks and benefits of procedure were reviewed with the patient.  Written consent obtained and scanned into the chart.  Lesion of concern was identified and located on left nasal bridge.  Liquid nitrogen was applied to area of concern and extending out 1 millimeters beyond the border of the lesion.  Treated area was allowed to come back to room temperature before treating it a second time.  Patient tolerated procedure well and there were no immediate complications.  Home care instructions were reviewed with the patient and a handout was provided.   Assessment/ Plan: 86 y.o. female   1. Elevated TSH - Thyroid Panel With TSH  2. Actinic keratoses Treated with cryo.  Referred to derm since required second  treatment - Ambulatory referral to Dermatology  3. Hypotension, unspecified hypotension type Cut Norvasc 2.24m daily.  Continue other meds.  Follow up with RN in 2 weeks for repeat BP.  If persistently <110/60, stop norvasc totally. Goal BP <150/90  4. Fatigue, unspecified type - Vitamin B12   Orders Placed This Encounter  Procedures  . Thyroid Panel With TSH  . Vitamin B12  . Ambulatory referral to Dermatology    Referral Priority:   Routine    Referral Type:   Consultation    Referral Reason:   Specialty Services Required    Requested Specialty:   Dermatology    Number of Visits Requested:   1   Meds ordered this encounter  Medications  . amLODipine (NORVASC) 5 MG tablet    Sig: Take 0.5 tablets (2.5 mg total) by mouth daily.    Dispense:  90 tablet    Refill:  0[place] DO [place]([phone]

## 2019-09-07 LAB — THYROID PANEL WITH TSH
Free Thyroxine Index: 1.9 (ref 1.2–4.9)
T3 Uptake Ratio: 32 % (ref 24–39)
T4, Total: 5.9 ug/dL (ref 4.5–12.0)
TSH: 3.9 u[IU]/mL (ref 0.450–4.500)

## 2019-09-07 LAB — VITAMIN B12: Vitamin B-12: 290 pg/mL (ref 232–1245)

## 2019-09-13 ENCOUNTER — Other Ambulatory Visit: Payer: Self-pay | Admitting: Family Medicine

## 2019-09-21 ENCOUNTER — Other Ambulatory Visit: Payer: Self-pay

## 2019-09-21 ENCOUNTER — Ambulatory Visit: Payer: Medicare Other

## 2019-09-21 DIAGNOSIS — I959 Hypotension, unspecified: Secondary | ICD-10-CM

## 2019-09-21 NOTE — Progress Notes (Signed)
Patient here today for blood pressure check.  Blood pressure today was 136/74, pulse 78.

## 2019-09-22 ENCOUNTER — Other Ambulatory Visit: Payer: Self-pay | Admitting: Family Medicine

## 2019-09-22 DIAGNOSIS — J309 Allergic rhinitis, unspecified: Secondary | ICD-10-CM

## 2019-09-28 ENCOUNTER — Other Ambulatory Visit: Payer: Self-pay | Admitting: Family Medicine

## 2019-09-28 DIAGNOSIS — R630 Anorexia: Secondary | ICD-10-CM

## 2019-10-17 ENCOUNTER — Other Ambulatory Visit: Payer: Self-pay | Admitting: Family Medicine

## 2019-10-27 ENCOUNTER — Other Ambulatory Visit: Payer: Self-pay | Admitting: Family Medicine

## 2019-10-27 DIAGNOSIS — I10 Essential (primary) hypertension: Secondary | ICD-10-CM

## 2019-11-03 ENCOUNTER — Ambulatory Visit (INDEPENDENT_AMBULATORY_CARE_PROVIDER_SITE_OTHER): Payer: Medicare Other | Admitting: Family Medicine

## 2019-11-03 ENCOUNTER — Encounter: Payer: Self-pay | Admitting: Family Medicine

## 2019-11-03 ENCOUNTER — Other Ambulatory Visit: Payer: Self-pay

## 2019-11-03 VITALS — BP 146/76 | HR 69 | Temp 96.9°F | Ht 60.0 in | Wt 87.4 lb

## 2019-11-03 DIAGNOSIS — E1369 Other specified diabetes mellitus with other specified complication: Secondary | ICD-10-CM

## 2019-11-03 DIAGNOSIS — E1159 Type 2 diabetes mellitus with other circulatory complications: Secondary | ICD-10-CM | POA: Diagnosis not present

## 2019-11-03 DIAGNOSIS — E1122 Type 2 diabetes mellitus with diabetic chronic kidney disease: Secondary | ICD-10-CM

## 2019-11-03 DIAGNOSIS — N183 Chronic kidney disease, stage 3 unspecified: Secondary | ICD-10-CM

## 2019-11-03 DIAGNOSIS — J3489 Other specified disorders of nose and nasal sinuses: Secondary | ICD-10-CM

## 2019-11-03 DIAGNOSIS — I10 Essential (primary) hypertension: Secondary | ICD-10-CM | POA: Diagnosis not present

## 2019-11-03 DIAGNOSIS — R630 Anorexia: Secondary | ICD-10-CM

## 2019-11-03 DIAGNOSIS — L89152 Pressure ulcer of sacral region, stage 2: Secondary | ICD-10-CM

## 2019-11-03 DIAGNOSIS — L97521 Non-pressure chronic ulcer of other part of left foot limited to breakdown of skin: Secondary | ICD-10-CM

## 2019-11-03 DIAGNOSIS — I4891 Unspecified atrial fibrillation: Secondary | ICD-10-CM

## 2019-11-03 LAB — BAYER DCA HB A1C WAIVED: HB A1C (BAYER DCA - WAIVED): 6.8 % (ref <7.0)

## 2019-11-03 LAB — BASIC METABOLIC PANEL
BUN/Creatinine Ratio: 23 (ref 12–28)
BUN: 27 mg/dL (ref 10–36)
CO2: 23 mmol/L (ref 20–29)
Calcium: 9.6 mg/dL (ref 8.7–10.3)
Chloride: 102 mmol/L (ref 96–106)
Creatinine, Ser: 1.15 mg/dL — ABNORMAL HIGH (ref 0.57–1.00)
GFR calc Af Amer: 47 mL/min/{1.73_m2} — ABNORMAL LOW (ref >59)
GFR calc non Af Amer: 41 mL/min/{1.73_m2} — ABNORMAL LOW (ref >59)
Glucose: 118 mg/dL — ABNORMAL HIGH (ref 65–99)
Potassium: 3.9 mmol/L (ref 3.5–5.2)
Sodium: 142 mmol/L (ref 134–144)

## 2019-11-03 MED ORDER — LINACLOTIDE 290 MCG PO CAPS
ORAL_CAPSULE | ORAL | 3 refills | Status: DC
Start: 2019-11-03 — End: 2020-12-16

## 2019-11-03 MED ORDER — MIRTAZAPINE 7.5 MG PO TABS: 7.5000 mg | ORAL_TABLET | Freq: Every day | ORAL | 3 refills | Status: DC

## 2019-11-03 MED ORDER — OLMESARTAN MEDOXOMIL 20 MG PO TABS: 20.0000 mg | ORAL_TABLET | Freq: Every day | ORAL | 3 refills | Status: AC

## 2019-11-03 MED ORDER — AZELASTINE HCL 0.1 % NA SOLN: 2.0000 | Freq: Two times a day (BID) | NASAL | 12 refills | Status: AC

## 2019-11-03 MED ORDER — AMLODIPINE BESYLATE 2.5 MG PO TABS: 2.5000 mg | ORAL_TABLET | Freq: Every day | ORAL | 3 refills | Status: DC

## 2019-11-03 MED ORDER — APIXABAN 2.5 MG PO TABS: ORAL_TABLET | ORAL | 3 refills | Status: AC

## 2019-11-03 MED ORDER — NEBIVOLOL HCL 5 MG PO TABS: 5.0000 mg | ORAL_TABLET | Freq: Every day | ORAL | 3 refills | Status: DC

## 2019-11-03 MED ORDER — CAREX COCCYX CUSHION MISC: 0 refills | Status: AC

## 2019-11-03 MED ORDER — HYDROCHLOROTHIAZIDE 12.5 MG PO CAPS: 12.5000 mg | ORAL_CAPSULE | Freq: Every day | ORAL | 3 refills | Status: AC | PRN

## 2019-11-03 MED ORDER — POLYETHYLENE GLYCOL 3350 17 GM/SCOOP PO POWD: 17.0000 g | Freq: Every day | ORAL | 1 refills | Status: AC

## 2019-11-03 NOTE — Progress Notes (Signed)
Subjective: CC: f/u Afib, DM PCP: Janora Norlander, DO [id number] Stacey Brown is a 86 y.o. female presenting to clinic today for:  1.  Weight check/malnutrition/diabetes/A. fib Patient is legally blind.  She is accompanied to today'Stacey visit by her daughter.  Overall she seems to be stable from previous check.  She continues to try and push fluids and offers her bed juice and water.  She has persistent lower extremity edema but they try and keep her legs elevated.  She wants me to address the lesion on her buttock.  She has had sacral ulcers in the past and it did look somewhat angry the other day but does seem to be getting better.  Does not report any drainage or bleeding.  She thinks they need a new donut pillow.  She has follow-up with Dr. Irving Shows in December.  The lesion on her left toe, which was previously debrided and had resolved is back.  Again no drainage but her feet are tender to palpation in general.  ROS: Per HPI  Allergies  Allergen Reactions  . Dexilant [Dexlansoprazole]     Throat swells   . Ace Inhibitors Cough   Past Medical History:  Diagnosis Date  . Abnormal liver function test   . Allergy    allergic  rhinitis  . Arthritis   . ASCVD (arteriosclerotic cardiovascular disease)   . Atrial fibrillation ([place])   . Cataract   . Chronic anticoagulation   . Chronic kidney disease   . Diabetes mellitus without complication ([place])   . Esophageal stricture   . GERD (gastroesophageal reflux disease)   . Glaucoma   . H/O TB (tuberculosis)   . History of DVT of lower extremity   . Hyperlipidemia   . Macular degeneration   . Osteoporosis   . Primary biliary cirrhosis ([place])   . Shingles   . Swallowing difficulty     Current Outpatient Medications:  .  ACCU-CHEK AVIVA PLUS test strip, USE UP TO 3 TIMES A DAY AS DIRECTED., Disp: 100 each, Rfl: 2 .  amLODipine (NORVASC) 5 MG tablet, Take 0.5 tablets (2.5 mg total) by mouth daily., Disp: 90 tablet, Rfl: 0 .  azelastine  (ASTELIN) 0.1 % nasal spray, Place 2 sprays into both nostrils 2 (two) times daily. Use in each nostril as directed, Disp: 30 mL, Rfl: 12 .  blood glucose meter kit and supplies, Dispense based on patient and insurance preference. Use up to four times daily as directed. (FOR ICD-9 250.00, 250.01)., Disp: 1 each, Rfl: 0 .  BYSTOLIC 5 MG tablet, TAKE 1 TABLET DAILY, Disp: 90 tablet, Rfl: 0 .  carboxymethylcellulose (REFRESH PLUS) 0.5 % SOLN, 1 drop 3 (three) times daily as needed., Disp: , Rfl:  .  cromolyn (OPTICROM) 4 % ophthalmic solution, , Disp: , Rfl:  .  ELIQUIS 2.5 MG TABS tablet, TAKE (1) TABLET TWICE A DAY., Disp: 180 tablet, Rfl: 0 .  hydrochlorothiazide (MICROZIDE) 12.5 MG capsule, TAKE (1) CAPSULE DAILY, Disp: 90 capsule, Rfl: 0 .  LINZESS 290 MCG CAPS capsule, TAKE (1) CAPSULE DAILY, Disp: 90 capsule, Rfl: 0 .  loratadine (CLARITIN) 10 MG tablet, TAKE 1 TABLET ONCE DAILY, Disp: 30 tablet, Rfl: 5 .  mirtazapine (REMERON) 7.5 MG tablet, TAKE 1 TABLET AT BEDTIME, Disp: 90 tablet, Rfl: 0 .  nystatin cream (MYCOSTATIN), Apply 1 application topically 2 (two) times daily., Disp: 30 g, Rfl: 2 .  olmesartan (BENICAR) 20 MG tablet, TAKE 1 TABLET EVERY DAY, Disp: 90 tablet,  Rfl: 0 .  polyethylene glycol powder (GLYCOLAX/MIRALAX) powder, Take 17 g by mouth daily., Disp: 3350 g, Rfl: 1 .  STARCH-MALTO DEXTRIN (THICK-IT) POWD, Take by mouth., Disp: , Rfl:  .  travoprost, benzalkonium, (TRAVATAN) 0.004 % ophthalmic solution, Place 1 drop into both eyes at bedtime., Disp: , Rfl:  Social History   Socioeconomic History  . Marital status: Widowed    Spouse name: Not on file  . Number of children: 1  . Years of education: Not on file  . Highest education level: Not on file  Occupational History  . Occupation: Retired    Comment: Disabled  Tobacco Use  . Smoking status: Never Smoker  . Smokeless tobacco: Never Used  Vaping Use  . Vaping Use: Never used  Substance and Sexual Activity  .  Alcohol use: No  . Drug use: No  . Sexual activity: Never  Other Topics Concern  . Not on file  Social History Narrative   Daily caffeine Use   Social Determinants of Health   Financial Resource Strain:   . Difficulty of Paying Living Expenses: Not on file  Food Insecurity:   . Worried About Charity fundraiser in the Last Year: Not on file  . Ran Out of Food in the Last Year: Not on file  Transportation Needs:   . Lack of Transportation (Medical): Not on file  . Lack of Transportation (Non-Medical): Not on file  Physical Activity:   . Days of Exercise per Week: Not on file  . Minutes of Exercise per Session: Not on file  Stress:   . Feeling of Stress : Not on file  Social Connections:   . Frequency of Communication with Friends and Family: Not on file  . Frequency of Social Gatherings with Friends and Family: Not on file  . Attends Religious Services: Not on file  . Active Member of Clubs or Organizations: Not on file  . Attends Archivist Meetings: Not on file  . Marital Status: Not on file  Intimate Partner Violence:   . Fear of Current or Ex-Partner: Not on file  . Emotionally Abused: Not on file  . Physically Abused: Not on file  . Sexually Abused: Not on file   Family History  Problem Relation Age of Onset  . Breast cancer Other        Neice  . Heart disease Mother   . Diabetes Mother        Brother,  . Heart disease Brother   . Kidney disease Cousin   . Cancer Father   . Osteoporosis Sister   . Hyperlipidemia Daughter   . Diabetes Daughter   . Diabetes Brother   . Thyroid disease Brother   . Cirrhosis Other        Nephew  . Colon cancer Neg Hx     Objective: Office vital signs reviewed. BP (!) 146/76   Pulse 69   Temp (!) 96.9 F (36.1 C) (Temporal)   Ht 5' (1.524 m)   Wt 87 lb 6.4 oz (39.6 kg)   SpO2 99%   BMI 17.07 kg/m   Physical Examination:  General: Awake, alert, mal nourished, frail appearing elderly female no acute  distress Cardio: Irregularly irregular with rate control, S1S2 heard, no murmurs appreciated Pulm: clear to auscultation bilaterally, no wheezes, rhonchi or rales; normal work of breathing on room air Extremities: Cool. No increased warmth.  1+ pitting edema to the ankles.no cyanosis or clubbing  MSK: Arrives in wheelchair.  Totally dependent for standing and transfers Skin: Mild skin breakdown with a palpable soft tissue swelling noted along the left intergluteal fold at the apex. She has skin breakdown of the toe as noted below Diabetic Foot Exam - Simple   Simple Foot Form Diabetic Foot exam was performed with the following findings: Yes 11/03/2019 10:47 AM  Visual Inspection See comments: Yes Sensation Testing See comments: Yes Pulse Check See comments: Yes Comments +1 pitting edema to bilateral ankles.  She has +1 pedal pulses.  There is what appears to be a mild diabetic foot ulcer noted along the dorsum of the left third toe.  No substantial drainage, surrounding erythema.  Skin breakdown is minimal.      Assessment/ Plan: 86 y.o. female   1. Pressure injury of sacral region, stage 2 ([place]) I have reinforced frequent position changes. New donut pillow prescribed - Misc. Devices (CAREX COCCYX CUSHION) MISC; Use as directed to offset pressure from sacrum  Dispense: 1 each; Refill: 0  2. Controlled other specified diabetes mellitus with other specified complication, without long-term current use of insulin (HCC) A1c controlled. Continue current regimen - Bayer DCA Hb A1c Waived  3. CKD stage 3 secondary to diabetes (HCC) Check BMP - Basic Metabolic Panel  4. Hypertension associated with diabetes ([place]) Controlled for age. Continue current regimen - Basic Metabolic Panel  5. Atrial fibrillation, unspecified type ([place]) Irregularly irregular but rate controlled  6. Ulcer of left foot, limited to breakdown of skin ([place]) I have given her Xeroform bandages and recommended  changing these daily. Referral back to Dr. Irving Shows if symptoms or not improving within the next 1 to 2 weeks  7. Rhinorrhea Stable - azelastine (ASTELIN) 0.1 % nasal spray; Place 2 sprays into both nostrils 2 (two) times daily. Use in each nostril as directed  Dispense: 30 mL; Refill: 12  8. Appetite loss Weight is essentially stable. Patient is very frail and elderly. Continue mirtazapine at current dose. - mirtazapine (REMERON) 7.5 MG tablet; Take 1 tablet (7.5 mg total) by mouth at bedtime.  Dispense: 90 tablet; Refill: 3   No orders of the defined types were placed in this encounter.  No orders of the defined types were placed in this encounter.    Janora Norlander, DO [place] [phone]

## 2019-12-14 DIAGNOSIS — Z029 Encounter for administrative examinations, unspecified: Secondary | ICD-10-CM

## 2020-01-17 ENCOUNTER — Other Ambulatory Visit: Payer: Self-pay | Admitting: Family Medicine

## 2020-01-23 DIAGNOSIS — B351 Tinea unguium: Secondary | ICD-10-CM | POA: Diagnosis not present

## 2020-01-23 DIAGNOSIS — M79676 Pain in unspecified toe(s): Secondary | ICD-10-CM | POA: Diagnosis not present

## 2020-01-23 DIAGNOSIS — L84 Corns and callosities: Secondary | ICD-10-CM | POA: Diagnosis not present

## 2020-01-23 DIAGNOSIS — I70203 Unspecified atherosclerosis of native arteries of extremities, bilateral legs: Secondary | ICD-10-CM | POA: Diagnosis not present

## 2020-02-05 ENCOUNTER — Ambulatory Visit (INDEPENDENT_AMBULATORY_CARE_PROVIDER_SITE_OTHER): Payer: Medicare Other | Admitting: Family Medicine

## 2020-02-05 ENCOUNTER — Other Ambulatory Visit: Payer: Self-pay

## 2020-02-05 VITALS — BP 140/76 | HR 66 | Temp 98.0°F

## 2020-02-05 DIAGNOSIS — J3489 Other specified disorders of nose and nasal sinuses: Secondary | ICD-10-CM | POA: Diagnosis not present

## 2020-02-05 DIAGNOSIS — L89152 Pressure ulcer of sacral region, stage 2: Secondary | ICD-10-CM | POA: Diagnosis not present

## 2020-02-05 NOTE — Patient Instructions (Signed)
For her runny nose:  Use saline nasal spray each morning.  Follow by Astelin Nasal Spray and her Loratidine.  At bedtime use Cromolyn (available over the counter)  Sore on her bottom is relatively unchanged. Continue to keep pressure off, continue with wound care.   Allergic Rhinitis, Adult Allergic rhinitis is a reaction to allergens in the air. Allergens are tiny specks (particles) in the air that cause your body to have an allergic reaction. This condition cannot be passed from person to person (is not contagious). Allergic rhinitis cannot be cured, but it can be controlled. There are two types of allergic rhinitis:  Seasonal. This type is also called hay fever. It happens only during certain times of the year.  Perennial. This type can happen at any time of the year. What are the causes? This condition may be caused by:  Pollen from grasses, trees, and weeds.  House dust mites.  Pet dander.  Mold. What are the signs or symptoms? Symptoms of this condition include:  Sneezing.  Runny or stuffy nose (nasal congestion).  A lot of mucus in the back of the throat (postnasal drip).  Itchy nose.  Tearing of the eyes.  Trouble sleeping.  Being sleepy during day. How is this treated? There is no cure for this condition. You should avoid things that trigger your symptoms (allergens). Treatment can help to relieve symptoms. This may include:  Medicines that block allergy symptoms, such as antihistamines. These may be given as a shot, nasal spray, or pill.  Shots that are given until your body becomes less sensitive to the allergen (desensitization).  Stronger medicines, if all other treatments have not worked. Follow these instructions at home: Avoiding allergens   Find out what you are allergic to. Common allergens include smoke, dust, and pollen.  Avoid them if you can. These are some of the things that you can do to avoid allergens: ? Replace carpet with wood,  tile, or vinyl flooring. Carpet can trap dander and dust. ? Clean any mold found in the home. ? Do not smoke. Do not allow smoking in your home. ? Change your heating and air conditioning filter at least once a month. ? During allergy season:  Keep windows closed as much as you can. If possible, use air conditioning when there is a lot of pollen in the air.  Use a special filter for allergies with your furnace and air conditioner.  Plan outdoor activities when pollen counts are lowest. This is usually during the early morning or evening hours.  If you do go outdoors when pollen count is high, wear a special mask for people with allergies.  When you come indoors, take a shower and change your clothes before sitting on furniture or bedding. General instructions  Do not use fans in your home.  Do not hang clothes outside to dry.  Wear sunglasses to keep pollen out of your eyes.  Wash your hands right away after you touch household pets.  Take over-the-counter and prescription medicines only as told by your doctor.  Keep all follow-up visits as told by your doctor. This is important. Contact a doctor if:  You have a fever.  You have a cough that does not go away (is persistent).  You start to make whistling sounds when you breathe (wheeze).  Your symptoms do not get better with treatment.  You have thick fluid coming from your nose.  You start to have nosebleeds. Get help right away if:  Your  tongue or your lips are swollen.  You have trouble breathing.  You feel dizzy or you feel like you are going to pass out (faint).  You have cold sweats. Summary  Allergic rhinitis is a reaction to allergens in the air.  This condition may be caused by allergens. These include pollen, dust mites, pet dander, and mold.  Symptoms include a runny, itchy nose, sneezing, or tearing eyes. You may also have trouble sleeping or feel sleepy during the day.  Treatment includes taking  medicines and avoiding allergens. You may also get shots or take stronger medicines.  Get help if you have a fever or a cough that does not stop. Get help right away if you are short of breath. This information is not intended to replace advice given to you by your health care provider. Make sure you discuss any questions you have with your health care provider. Document Revised: 05/17/2018 Document Reviewed: 08/17/2017 Elsevier Patient Education  2020 [employer].

## 2020-02-05 NOTE — Progress Notes (Signed)
Subjective: CC: Follow-up sacral ulcer PCP: Janora Norlander, DO [id number] S [place] is a 86 y.o. female presenting to clinic today for:  1.  Sacral ulcer Patient noted to have a stage II sacral ulcer during our last visit.  She reports to me that she is having wound care check it out each visit.  She reports compliance with her sacral pillow and does feel that the bottom is feeling better.  No reports of drainage or bleeding.  2.  Rhinorrhea Patient reports ongoing rhinorrhea.  She reports compliance with Astelin nasal spray and Claritin.  No fevers.  Rhinorrhea is worse with use of mask.  ROS: Per HPI  Allergies  Allergen Reactions  . Dexilant [Dexlansoprazole]     Throat swells   . Ace Inhibitors Cough   Past Medical History:  Diagnosis Date  . Abnormal liver function test   . Allergy    allergic  rhinitis  . Arthritis   . ASCVD (arteriosclerotic cardiovascular disease)   . Atrial fibrillation ([place])   . Cataract   . Chronic anticoagulation   . Chronic kidney disease   . Diabetes mellitus without complication ([employer])   . Esophageal stricture   . GERD (gastroesophageal reflux disease)   . Glaucoma   . H/O TB (tuberculosis)   . History of DVT of lower extremity   . Hyperlipidemia   . Macular degeneration   . Osteoporosis   . Primary biliary cirrhosis ([place])   . Shingles   . Swallowing difficulty     Current Outpatient Medications:  .  ACCU-CHEK AVIVA PLUS test strip, USE UP TO 3 TIMES A DAY AS DIRECTED., Disp: 100 each, Rfl: 2 .  amLODipine (NORVASC) 2.5 MG tablet, Take 1 tablet (2.5 mg total) by mouth daily., Disp: 90 tablet, Rfl: 3 .  amLODipine (NORVASC) 5 MG tablet, TAKE 1 TABLET ONCE DAILY, Disp: 30 tablet, Rfl: 0 .  apixaban (ELIQUIS) 2.5 MG TABS tablet, TAKE (1) TABLET TWICE A DAY., Disp: 180 tablet, Rfl: 3 .  azelastine (ASTELIN) 0.1 % nasal spray, Place 2 sprays into both nostrils 2 (two) times daily. Use in each nostril as directed, Disp: 30 mL, Rfl: 12 .   blood glucose meter kit and supplies, Dispense based on patient and insurance preference. Use up to four times daily as directed. (FOR ICD-9 250.00, 250.01)., Disp: 1 each, Rfl: 0 .  carboxymethylcellulose (REFRESH PLUS) 0.5 % SOLN, 1 drop 3 (three) times daily as needed., Disp: , Rfl:  .  cromolyn (OPTICROM) 4 % ophthalmic solution, , Disp: , Rfl:  .  hydrochlorothiazide (MICROZIDE) 12.5 MG capsule, Take 1 capsule (12.5 mg total) by mouth daily as needed (edema)., Disp: 90 capsule, Rfl: 3 .  linaclotide (LINZESS) 290 MCG CAPS capsule, TAKE (1) CAPSULE DAILY, Disp: 90 capsule, Rfl: 3 .  loratadine (CLARITIN) 10 MG tablet, TAKE 1 TABLET ONCE DAILY, Disp: 30 tablet, Rfl: 5 .  mirtazapine (REMERON) 7.5 MG tablet, Take 1 tablet (7.5 mg total) by mouth at bedtime., Disp: 90 tablet, Rfl: 3 .  Misc. Devices (CAREX COCCYX CUSHION) MISC, Use as directed to offset pressure from sacrum, Disp: 1 each, Rfl: 0 .  nebivolol (BYSTOLIC) 5 MG tablet, Take 1 tablet (5 mg total) by mouth daily., Disp: 90 tablet, Rfl: 3 .  nystatin cream (MYCOSTATIN), Apply 1 application topically 2 (two) times daily., Disp: 30 g, Rfl: 2 .  olmesartan (BENICAR) 20 MG tablet, Take 1 tablet (20 mg total) by mouth daily., Disp: 90 tablet, Rfl:  3 .  polyethylene glycol powder (GLYCOLAX/MIRALAX) 17 GM/SCOOP powder, Take 17 g by mouth daily., Disp: 3350 g, Rfl: 1 .  STARCH-MALTO DEXTRIN (THICK-IT) POWD, Take by mouth., Disp: , Rfl:  .  travoprost, benzalkonium, (TRAVATAN) 0.004 % ophthalmic solution, Place 1 drop into both eyes at bedtime., Disp: , Rfl:  Social History   Socioeconomic History  . Marital status: Widowed    Spouse name: Not on file  . Number of children: 1  . Years of education: Not on file  . Highest education level: Not on file  Occupational History  . Occupation: Retired    Comment: Disabled  Tobacco Use  . Smoking status: Never Smoker  . Smokeless tobacco: Never Used  Vaping Use  . Vaping Use: Never used   Substance and Sexual Activity  . Alcohol use: No  . Drug use: No  . Sexual activity: Never  Other Topics Concern  . Not on file  Social History Narrative   Daily caffeine Use   Social Determinants of Health   Financial Resource Strain: Not on file  Food Insecurity: Not on file  Transportation Needs: Not on file  Physical Activity: Not on file  Stress: Not on file  Social Connections: Not on file  Intimate Partner Violence: Not on file   Family History  Problem Relation Age of Onset  . Breast cancer Other        Neice  . Heart disease Mother   . Diabetes Mother        Brother,  . Heart disease Brother   . Kidney disease Cousin   . Cancer Father   . Osteoporosis Sister   . Hyperlipidemia Daughter   . Diabetes Daughter   . Diabetes Brother   . Thyroid disease Brother   . Cirrhosis Other        Nephew  . Colon cancer Neg Hx     Objective: Office vital signs reviewed. BP 140/76   Pulse 66   Temp 98 F (36.7 C) (Temporal)   SpO2 96%   Physical Examination:  General: Awake, alert, frail appearing elderly female, No acute distress HEENT: Normal; sclera white; clear rhinorrhea noted.  No significant erythema or edema of the nasal turbinates Cardio: regular rate  Pulm:   normal work of breathing on room air Extremities: Thin, poor tone.  She does have edema noted to ankles with associated weeping of skin. MSK: Arrives in wheelchair Skin: Chronic venous stasis dermatitis noted.  She continues to have a stage I-2 sacral ulcer Neuro: Hard of hearing  Assessment/ Plan: 87 y.o. female   Rhinorrhea  Pressure injury of sacral region, stage 2 (HCC)  Nasal saline spray each morning, followed by Claritin and Astelin.  Cromolyn each evening plus or minus Astelin nasal spray if effective.  Her pressure injury appears to be healing and is on the cusp of stage I.  Continue offsetting pressure, monitoring and appropriate wound care if needed.  She may follow-up in 3  months, sooner if needed No orders of the defined types were placed in this encounter.  No orders of the defined types were placed in this encounter.    Janora Norlander, DO [place] [phone]

## 2020-04-08 ENCOUNTER — Other Ambulatory Visit: Payer: Self-pay | Admitting: Family Medicine

## 2020-04-08 DIAGNOSIS — J309 Allergic rhinitis, unspecified: Secondary | ICD-10-CM

## 2020-04-30 DIAGNOSIS — B351 Tinea unguium: Secondary | ICD-10-CM | POA: Diagnosis not present

## 2020-04-30 DIAGNOSIS — L84 Corns and callosities: Secondary | ICD-10-CM | POA: Diagnosis not present

## 2020-04-30 DIAGNOSIS — I70203 Unspecified atherosclerosis of native arteries of extremities, bilateral legs: Secondary | ICD-10-CM | POA: Diagnosis not present

## 2020-04-30 DIAGNOSIS — M79676 Pain in unspecified toe(s): Secondary | ICD-10-CM | POA: Diagnosis not present

## 2020-05-01 ENCOUNTER — Other Ambulatory Visit: Payer: Self-pay

## 2020-05-01 ENCOUNTER — Encounter: Payer: Self-pay | Admitting: Family Medicine

## 2020-05-01 ENCOUNTER — Ambulatory Visit (INDEPENDENT_AMBULATORY_CARE_PROVIDER_SITE_OTHER): Payer: Medicare Other | Admitting: Family Medicine

## 2020-05-01 VITALS — BP 170/70 | HR 72 | Temp 97.2°F | Ht 59.0 in | Wt 88.0 lb

## 2020-05-01 DIAGNOSIS — N183 Chronic kidney disease, stage 3 unspecified: Secondary | ICD-10-CM | POA: Diagnosis not present

## 2020-05-01 DIAGNOSIS — E1159 Type 2 diabetes mellitus with other circulatory complications: Secondary | ICD-10-CM | POA: Diagnosis not present

## 2020-05-01 DIAGNOSIS — I4891 Unspecified atrial fibrillation: Secondary | ICD-10-CM | POA: Diagnosis not present

## 2020-05-01 DIAGNOSIS — R54 Age-related physical debility: Secondary | ICD-10-CM

## 2020-05-01 DIAGNOSIS — I152 Hypertension secondary to endocrine disorders: Secondary | ICD-10-CM

## 2020-05-01 DIAGNOSIS — E1122 Type 2 diabetes mellitus with diabetic chronic kidney disease: Secondary | ICD-10-CM | POA: Diagnosis not present

## 2020-05-01 DIAGNOSIS — L98421 Non-pressure chronic ulcer of back limited to breakdown of skin: Secondary | ICD-10-CM | POA: Diagnosis not present

## 2020-05-01 LAB — BAYER DCA HB A1C WAIVED: HB A1C (BAYER DCA - WAIVED): 6.5 % (ref <7.0)

## 2020-05-01 MED ORDER — SILVER SULFADIAZINE 1 % EX CREA: 1.0000 "application " | TOPICAL_CREAM | Freq: Every day | CUTANEOUS | 0 refills | Status: DC

## 2020-05-01 NOTE — Patient Instructions (Signed)
Apply the cream to the affected area once daily.  Throughout the rest the day, recommend a barrier cream like Desitin.  Try and keep pressure off of that area.  Check her blood pressures at home and contact me with these results.  Goal blood pressure is less than 150/90  Her sugar was well controlled.  Continue soft diet

## 2020-05-01 NOTE — Progress Notes (Signed)
Subjective: CC: DM PCP: Janora Norlander, DO [id number] Stacey Brown is a 87 y.o. female presenting to clinic today for:  1.Type 2 Diabetes with hypertension, CKD3, hyperlipidemia; atrial fibrillation, sacral ulcer:  She is currently on a soft diet due to esophageal strictures.  No choking.  The use Thick-It to thicken her fluids.  She is getting protein through babyfood.  Her daughter notes that she has had a sacral ulcer that appears and then resolves intermittently.  The last time it did bleed quite a bit.  The patient otherwise has not had any rectal or vaginal bleeding to report.  She is compliant with her Eliquis.  They tried using the donut pill that I prescribed last time but patient could not find comfort on it and therefore they are using other supports to offset pressure in that area.  She has not had her blood pressure medications today.  Her blood pressures are typically monitored by family member at home and have been less than 226 systolic.  She does not report any chest pain or shortness of breath today.  Last eye exam: Up-to-date Last foot exam: Up-to-date; recently saw podiatry and given good bill of health Last A1c:  Lab Results  Component Value Date   HGBA1C 6.8 11/03/2019   Nephropathy screen indicated?:  Up-to-date Last flu, zoster and/or pneumovax:  Immunization History  Administered Date(Stacey) Administered  . Influenza, High Dose Seasonal PF 12/06/2015, 01/06/2017, 03/01/2018  . Influenza,inj,Quad PF,6+ Mos 11/03/2012  . Moderna Sars-Covid-2 Vaccination 10/17/2019  . Pneumococcal Conjugate-13 11/01/2013  . Pneumococcal Polysaccharide-23 10/29/2017  . Tdap 05/19/2012     ROS: Per HPI  Allergies  Allergen Reactions  . Dexilant [Dexlansoprazole]     Throat swells   . Ace Inhibitors Cough   Past Medical History:  Diagnosis Date  . Abnormal liver function test   . Allergy    allergic  rhinitis  . Arthritis   . ASCVD (arteriosclerotic cardiovascular  disease)   . Atrial fibrillation ([place])   . Cataract   . Chronic anticoagulation   . Chronic kidney disease   . Diabetes mellitus without complication ([place])   . Esophageal stricture   . GERD (gastroesophageal reflux disease)   . Glaucoma   . H/O TB (tuberculosis)   . History of DVT of lower extremity   . Hyperlipidemia   . Macular degeneration   . Osteoporosis   . Primary biliary cirrhosis ([place])   . Shingles   . Swallowing difficulty     Current Outpatient Medications:  .  ACCU-CHEK AVIVA PLUS test strip, USE UP TO 3 TIMES A DAY AS DIRECTED., Disp: 100 each, Rfl: 2 .  amLODipine (NORVASC) 2.5 MG tablet, Take 1 tablet (2.5 mg total) by mouth daily., Disp: 90 tablet, Rfl: 3 .  apixaban (ELIQUIS) 2.5 MG TABS tablet, TAKE (1) TABLET TWICE A DAY., Disp: 180 tablet, Rfl: 3 .  azelastine (ASTELIN) 0.1 % nasal spray, Place 2 sprays into both nostrils 2 (two) times daily. Use in each nostril as directed, Disp: 30 mL, Rfl: 12 .  blood glucose meter kit and supplies, Dispense based on patient and insurance preference. Use up to four times daily as directed. (FOR ICD-9 250.00, 250.01)., Disp: 1 each, Rfl: 0 .  carboxymethylcellulose (REFRESH PLUS) 0.5 % SOLN, 1 drop 3 (three) times daily as needed., Disp: , Rfl:  .  cromolyn (OPTICROM) 4 % ophthalmic solution, , Disp: , Rfl:  .  hydrochlorothiazide (MICROZIDE) 12.5 MG capsule, Take 1 capsule (12.5  mg total) by mouth daily as needed (edema)., Disp: 90 capsule, Rfl: 3 .  linaclotide (LINZESS) 290 MCG CAPS capsule, TAKE (1) CAPSULE DAILY, Disp: 90 capsule, Rfl: 3 .  loratadine (CLARITIN) 10 MG tablet, TAKE 1 TABLET ONCE DAILY, Disp: 30 tablet, Rfl: 5 .  mirtazapine (REMERON) 7.5 MG tablet, Take 1 tablet (7.5 mg total) by mouth at bedtime., Disp: 90 tablet, Rfl: 3 .  Misc. Devices (CAREX COCCYX CUSHION) MISC, Use as directed to offset pressure from sacrum, Disp: 1 each, Rfl: 0 .  nebivolol (BYSTOLIC) 5 MG tablet, Take 1 tablet (5 mg total) by mouth  daily., Disp: 90 tablet, Rfl: 3 .  nystatin cream (MYCOSTATIN), Apply 1 application topically 2 (two) times daily., Disp: 30 g, Rfl: 2 .  olmesartan (BENICAR) 20 MG tablet, Take 1 tablet (20 mg total) by mouth daily., Disp: 90 tablet, Rfl: 3 .  polyethylene glycol powder (GLYCOLAX/MIRALAX) 17 GM/SCOOP powder, Take 17 g by mouth daily., Disp: 3350 g, Rfl: 1 .  STARCH-MALTO DEXTRIN (THICK-IT) POWD, Take by mouth., Disp: , Rfl:  .  travoprost, benzalkonium, (TRAVATAN) 0.004 % ophthalmic solution, Place 1 drop into both eyes at bedtime., Disp: , Rfl:  Social History   Socioeconomic History  . Marital status: Widowed    Spouse name: Not on file  . Number of children: 1  . Years of education: Not on file  . Highest education level: Not on file  Occupational History  . Occupation: Retired    Comment: Disabled  Tobacco Use  . Smoking status: Never Smoker  . Smokeless tobacco: Never Used  Vaping Use  . Vaping Use: Never used  Substance and Sexual Activity  . Alcohol use: No  . Drug use: No  . Sexual activity: Never  Other Topics Concern  . Not on file  Social History Narrative   Daily caffeine Use   Social Determinants of Health   Financial Resource Strain: Not on file  Food Insecurity: Not on file  Transportation Needs: Not on file  Physical Activity: Not on file  Stress: Not on file  Social Connections: Not on file  Intimate Partner Violence: Not on file   Family History  Problem Relation Age of Onset  . Breast cancer Other        Neice  . Heart disease Mother   . Diabetes Mother        Brother,  . Heart disease Brother   . Kidney disease Cousin   . Cancer Father   . Osteoporosis Sister   . Hyperlipidemia Daughter   . Diabetes Daughter   . Diabetes Brother   . Thyroid disease Brother   . Cirrhosis Other        Nephew  . Colon cancer Neg Hx     Objective: Office vital signs reviewed. BP (!) 170/70   Pulse 72   Temp (!) 97.2 F (36.2 C) (Temporal)   Ht $R'4\' 11"'jE$   (1.499 m)   Wt 88 lb (39.9 kg)   SpO2 96%   BMI 17.77 kg/m   Physical Examination:  General: Awake, alert, thin, frail, No acute distress HEENT: Normal; sclera white Cardio: Bradycardic with regular rhythm, S1S2 heard, no murmurs appreciated Pulm: clear to auscultation bilaterally, no wheezes, rhonchi or rales; normal work of breathing on room air GU: Right intergluteal cleft with a healing pressure wound noted.  Minimal tunneling noted.  No active drainage, erythema, induration or palpable fluctuance MSK: Thin, arrives in wheelchair.  Poor muscle tone.  Assessment/ Plan:  87 y.o. female   Controlled type 2 diabetes mellitus with stage 3 chronic kidney disease, without long-term current use of insulin ([place]) - Plan: Renal Function Panel, VITAMIN D 25 Hydroxy (Vit-D Deficiency, Fractures), CBC, Bayer DCA Hb A1c Waived  Hypertension associated with diabetes (HCC)  Atrial fibrillation, unspecified type (HCC)  Skin ulcer of sacrum, limited to breakdown of skin ([place]) - Plan: silver sulfADIAZINE (SILVADENE) 1 % cream  Frailty  Sugar is diet controlled A1c of 6.5 today.  Check renal function panel, vitamin D and CBC given known CKD 3.  Blood pressure is not at goal but she has not taken any of her medications today.  I have asked her daughter to monitor blood pressures at home and contact me with these readings.  We discussed goal of less than 150/90.  Her previous blood pressures have been controlled so anticipate this is simply because medications have not been administered today  She was rate and rhythm controlled today  There is an appreciable healing pressure sore noted along the right intergluteal cleft.  No evidence of secondary infection.  No drainage.  Nontender to palpation.  Silver Silvadene cream given for protection of the skin over the next couple weeks as it heals.  We discussed using a barrier cream, frequent rotation and avoidance of excessive pressure on the sacrum.  We will  follow-up in 3 months for recheck of blood pressure, eval for sacral wounds and recheck weight.  No orders of the defined types were placed in this encounter.  No orders of the defined types were placed in this encounter.    Janora Norlander, DO [place] [phone]

## 2020-05-02 LAB — RENAL FUNCTION PANEL
Albumin: 4.5 g/dL (ref 3.5–4.6)
BUN/Creatinine Ratio: 24 (ref 12–28)
BUN: 28 mg/dL (ref 10–36)
CO2: 22 mmol/L (ref 20–29)
Calcium: 9.4 mg/dL (ref 8.7–10.3)
Chloride: 99 mmol/L (ref 96–106)
Creatinine, Ser: 1.17 mg/dL — ABNORMAL HIGH (ref 0.57–1.00)
Glucose: 125 mg/dL — ABNORMAL HIGH (ref 65–99)
Phosphorus: 4 mg/dL (ref 3.0–4.3)
Potassium: 4.2 mmol/L (ref 3.5–5.2)
Sodium: 139 mmol/L (ref 134–144)
eGFR: 43 mL/min/{1.73_m2} — ABNORMAL LOW (ref >59)

## 2020-05-02 LAB — VITAMIN D 25 HYDROXY (VIT D DEFICIENCY, FRACTURES): Vit D, 25-Hydroxy: 33.9 ng/mL (ref 30.0–100.0)

## 2020-05-02 LAB — CBC
Hematocrit: 39.2 % (ref 34.0–46.6)
Hemoglobin: 12.7 g/dL (ref 11.1–15.9)
MCH: 29.5 pg (ref 26.6–33.0)
MCHC: 32.4 g/dL (ref 31.5–35.7)
MCV: 91 fL (ref 79–97)
Platelets: 136 10*3/uL — ABNORMAL LOW (ref 150–450)
RBC: 4.31 x10E6/uL (ref 3.77–5.28)
RDW: 13.3 % (ref 11.7–15.4)
WBC: 5 10*3/uL (ref 3.4–10.8)

## 2020-05-09 NOTE — Progress Notes (Signed)
R/C about labs please call [phone]

## 2020-05-16 DIAGNOSIS — E1065 Type 1 diabetes mellitus with hyperglycemia: Secondary | ICD-10-CM | POA: Diagnosis not present

## 2020-05-16 DIAGNOSIS — R531 Weakness: Secondary | ICD-10-CM | POA: Diagnosis not present

## 2020-08-02 ENCOUNTER — Other Ambulatory Visit: Payer: Self-pay

## 2020-08-02 ENCOUNTER — Ambulatory Visit (INDEPENDENT_AMBULATORY_CARE_PROVIDER_SITE_OTHER): Payer: Medicare Other | Admitting: Family Medicine

## 2020-08-02 VITALS — BP 112/69 | HR 62 | Temp 97.3°F | Ht 59.0 in | Wt 82.6 lb

## 2020-08-02 DIAGNOSIS — E440 Moderate protein-calorie malnutrition: Secondary | ICD-10-CM | POA: Diagnosis not present

## 2020-08-02 DIAGNOSIS — I152 Hypertension secondary to endocrine disorders: Secondary | ICD-10-CM

## 2020-08-02 DIAGNOSIS — L98421 Non-pressure chronic ulcer of back limited to breakdown of skin: Secondary | ICD-10-CM | POA: Diagnosis not present

## 2020-08-02 DIAGNOSIS — E1159 Type 2 diabetes mellitus with other circulatory complications: Secondary | ICD-10-CM | POA: Diagnosis not present

## 2020-08-02 DIAGNOSIS — R627 Adult failure to thrive: Secondary | ICD-10-CM | POA: Diagnosis not present

## 2020-08-02 DIAGNOSIS — S90822A Blister (nonthermal), left foot, initial encounter: Secondary | ICD-10-CM

## 2020-08-02 MED ORDER — BOOST VERY HIGH CALORIE PO LIQD
8.0000 [foz_us] | Freq: Three times a day (TID) | ORAL | 99 refills | Status: AC
Start: 2020-08-02 — End: ?

## 2020-08-02 NOTE — Patient Instructions (Signed)
I have ordered the "very high calorie" boost for her.  Hopefully we can get the insurance to cover since she is underweight and continues to lose weight  Failure to Thrive, Adult Failure to thrive is a group of symptoms that affect adults, including the elderly. These symptoms include loss of appetite and weight loss. People who have this condition may do fewer and fewer activities over time. They may lose interest in being with friends, or they may not want to eat or drink. Thiscondition is not a normal part of aging. What are the causes? This condition may be caused by: A disease, such as dementia, diabetes, cancer, or lung disease. A health problem, such as a vitamin deficiency or a heart problem. A disorder, such as depression. A disability. Medicines. Having tooth or mouth problems. Mistreatment or neglect. In some cases, the cause may not be known. What are the signs or symptoms? Symptoms of this condition include: Loss of more than 5% of your body weight. Being more tired than normal after an activity. Having trouble getting up after sitting. Loss of appetite. Not getting out of bed. Not wanting to do usual activities. Depression. Getting infections often. Bedsores. Taking a long time to recover after an infection, injury, or a surgery. Weakness. How is this diagnosed? This condition may be diagnosed with a health history, physical exam, and tests. Your health care provider will ask questions about your health, behavior, and mood, such as: Has your activity changed? Do you seem sad? Are your eating habits different? Tests may also be done. They may include: Blood tests. Urine tests. Imaging tests, such as: X-rays. CT scan. MRI. Hearing tests. Vision tests. Tests to check thinking ability (cognitive tests). Activity tests. Your health care provider may want to see if you can do tasks such as bathing and dressing and if you can move around safely. You may be referred  to a specialist. How is this treated? Treatment for this condition depends on the cause. It may be treated by: Treating a disease or disorder that is causing symptoms. Having talk therapy or taking medicine to treat depression. Improving diet, such as by eating more often or taking nutritional supplements. Changing or stopping a medicine. Having physical or occupational therapy. It often takes a team of health care providers to find the right treatment. Follow these instructions at home:  Take over-the-counter and prescription medicines only as told by your health care provider. Eat a healthy, well-balanced diet. Make sure to get enough calories in each meal. Ask your health care provider how many calories you need. Be physically active. Include strength training as part of your exercise routine. A physical therapist can help to set up an exercise program that is right for you. Make sure that you are safe at home. Have a plan for what to do if you become unable to make decisions for yourself. Contact a health care provider if you: Are not able to eat well. Are not able to move around. Feel very sad or hopeless. Get help right away if: You have thoughts of ending your life. You cannot eat or drink. You do not get out of bed. Staying at home is no longer safe. You have a fever. Summary Failure to thrive is a group of symptoms that affect adults, including the elderly. Symptoms include loss of appetite and weight loss. Take over-the-counter and prescription medicines only as told by your health care provider. Eat a healthy, well-balanced diet. Make sure to get enough  calories in each meal. Ask your health care provider how many calories you need. Be physically active. Include strength training as part of your exercise routine. A physical therapist can help to set up an exercise program that is right for you. This information is not intended to replace advice given to you by your health  care provider. Make sure you discuss any questions you have with your healthcare provider. Document Revised: 09/28/2017 Document Reviewed: 09/28/2017 Elsevier Patient Education  2022 [employer].

## 2020-08-02 NOTE — Progress Notes (Signed)
Subjective: CC: Follow-up weight, poor appetite PCP: Stacey Norlander, DO FBP:[name] is a 87 y.o. female presenting to clinic today for:  1.  Failure to thrive Patient is coming today by her daughter who notes that she continues not to have the best appetite.  She often eats very small meals.  The patient feels that she is often offered too much food.  Her daughter describes her suppers as half a sandwich.  She does eat her breakfast typically and her lunch.  She has not been very successful with getting her to drink meal supplement drinks.  She notes that she sits on a single glass of water all day long.  She has been suffering from some constipation and in fact recently required manual disimpaction.  She is continued on Linzess 290 mcg and uses MiraLAX as needed.  2.  Leg wounds Her daughter notes that she has been having some spots come up on the lateral aspect of her left foot that leaked clear fluid.  She has had a lesion on the left anterior shin where she scraped her skin and it seemed to heal but then she rescraped it.  She is chronically anticoagulated.  Her daughter denies any blood in stool, blood in urine or bleeding elsewhere.  The lesions on her buttocks seem to be responding to the Silvadene cream that was prescribed at last visit and she keeps a close eye on these.  Patient denies any tenderness to these areas.  No reports of bleeding in these areas   ROS: Per HPI  Allergies  Allergen Reactions   Dexilant [Dexlansoprazole]     Throat swells    Ace Inhibitors Cough   Past Medical History:  Diagnosis Date   Abnormal liver function test    Allergy    allergic  rhinitis   Arthritis    ASCVD (arteriosclerotic cardiovascular disease)    Atrial fibrillation (HCC)    Cataract    Chronic anticoagulation    Chronic kidney disease    Diabetes mellitus without complication (HCC)    Esophageal stricture    GERD (gastroesophageal reflux disease)    Glaucoma    H/O  TB (tuberculosis)    History of DVT of lower extremity    Hyperlipidemia    Macular degeneration    Osteoporosis    Primary biliary cirrhosis (HCC)    Shingles    Swallowing difficulty     Current Outpatient Medications:    ACCU-CHEK AVIVA PLUS test strip, USE UP TO 3 TIMES A DAY AS DIRECTED., Disp: 100 each, Rfl: 2   amLODipine (NORVASC) 2.5 MG tablet, Take 1 tablet (2.5 mg total) by mouth daily., Disp: 90 tablet, Rfl: 3   apixaban (ELIQUIS) 2.5 MG TABS tablet, TAKE (1) TABLET TWICE A DAY., Disp: 180 tablet, Rfl: 3   azelastine (ASTELIN) 0.1 % nasal spray, Place 2 sprays into both nostrils 2 (two) times daily. Use in each nostril as directed, Disp: 30 mL, Rfl: 12   blood glucose meter kit and supplies, Dispense based on patient and insurance preference. Use up to four times daily as directed. (FOR ICD-9 250.00, 250.01)., Disp: 1 each, Rfl: 0   carboxymethylcellulose (REFRESH PLUS) 0.5 % SOLN, 1 drop 3 (three) times daily as needed., Disp: , Rfl:    cromolyn (OPTICROM) 4 % ophthalmic solution, , Disp: , Rfl:    hydrochlorothiazide (MICROZIDE) 12.5 MG capsule, Take 1 capsule (12.5 mg total) by mouth daily as needed (edema)., Disp: 90 capsule, Rfl:  3   linaclotide (LINZESS) 290 MCG CAPS capsule, TAKE (1) CAPSULE DAILY, Disp: 90 capsule, Rfl: 3   loratadine (CLARITIN) 10 MG tablet, TAKE 1 TABLET ONCE DAILY, Disp: 30 tablet, Rfl: 5   mirtazapine (REMERON) 7.5 MG tablet, Take 1 tablet (7.5 mg total) by mouth at bedtime., Disp: 90 tablet, Rfl: 3   Misc. Devices (CAREX COCCYX CUSHION) MISC, Use as directed to offset pressure from sacrum, Disp: 1 each, Rfl: 0   nebivolol (BYSTOLIC) 5 MG tablet, Take 1 tablet (5 mg total) by mouth daily., Disp: 90 tablet, Rfl: 3   nystatin cream (MYCOSTATIN), Apply 1 application topically 2 (two) times daily., Disp: 30 g, Rfl: 2   olmesartan (BENICAR) 20 MG tablet, Take 1 tablet (20 mg total) by mouth daily., Disp: 90 tablet, Rfl: 3   polyethylene glycol powder  (GLYCOLAX/MIRALAX) 17 GM/SCOOP powder, Take 17 g by mouth daily., Disp: 3350 g, Rfl: 1   silver sulfADIAZINE (SILVADENE) 1 % cream, Apply 1 application topically daily. x14 days, Disp: 50 g, Rfl: 0   STARCH-MALTO DEXTRIN POWD, Take by mouth., Disp: , Rfl:    travoprost, benzalkonium, (TRAVATAN) 0.004 % ophthalmic solution, Place 1 drop into both eyes at bedtime., Disp: , Rfl:  Social History   Socioeconomic History   Marital status: Widowed    Spouse name: Not on file   Number of children: 1   Years of education: Not on file   Highest education level: Not on file  Occupational History   Occupation: Retired    Comment: Disabled  Tobacco Use   Smoking status: Never   Smokeless tobacco: Never  Vaping Use   Vaping Use: Never used  Substance and Sexual Activity   Alcohol use: No   Drug use: No   Sexual activity: Never  Other Topics Concern   Not on file  Social History Narrative   Daily caffeine Use   Social Determinants of Health   Financial Resource Strain: Not on file  Food Insecurity: Not on file  Transportation Needs: Not on file  Physical Activity: Not on file  Stress: Not on file  Social Connections: Not on file  Intimate Partner Violence: Not on file   Family History  Problem Relation Age of Onset   Breast cancer Other        Neice   Heart disease Mother    Diabetes Mother        Brother,   Heart disease Brother    Kidney disease Cousin    Cancer Father    Osteoporosis Sister    Hyperlipidemia Daughter    Diabetes Daughter    Diabetes Brother    Thyroid disease Brother    Cirrhosis Other        Nephew   Colon cancer Neg Hx     Objective: Office vital signs reviewed. BP 112/69   Pulse 62   Temp (!) 97.3 F (36.3 C)   Ht '4\' 11"'  (1.499 m)   Wt 82 lb 9.6 oz (37.5 kg)   SpO2 96%   BMI 16.68 kg/m   Physical Examination:  General: Awake, alert, cachectic elderly female, No acute distress HEENT: Bitemporal wasting noted. Cardio: regular rate and  rhythm, S1S2 heard, no murmurs appreciated Pulm: clear to auscultation bilaterally, no wheezes, rhonchi or rales; normal work of breathing on room air GU: The sacral wound at the apex appears to have healed over.  The lesion that is along the right inner gluteal fold has appreciable pit/mild tunneling but without  exudates, bleeding, associated erythema or tenderness. MSK: Arrives in wheelchair.  She has prominent kyphosis of the thoracic spine and is hunched over.  She is able to move her extremities independently but tone is very poor Skin: She has a healing abrasion noted along the anterior shin that does not appear to be infected.  She has trace to +1 pitting edema noted to bilateral mid shins.  There are 2 shallow areas of skin breakdown that appear to have been blisters.  Clear fluid are noted weeping from these.  There is no purulence, odor, tenderness or induration  Assessment/ Plan: 87 y.o. female   Adult failure to thrive - Plan: Nutritional Supplements (BOOST VERY HIGH CALORIE) LIQD  Malnutrition of moderate degree (HCC) - Plan: Nutritional Supplements (BOOST VERY HIGH CALORIE) LIQD  Skin ulcer of sacrum, limited to breakdown of skin ([place]) - Plan: Nutritional Supplements (BOOST VERY HIGH CALORIE) LIQD  Blister of left foot, initial encounter  Hypertension associated with diabetes ([place])  I again reinforced that this patient can eat ad lib.  I would not restrict her based on her diabetes as her A1c has been diet controlled with last A1c around 6.5.  I have prescribed her high-calorie nutritional supplements in hopes that she will at least consume 2 these per day in addition to her normal scheduled meals.  We had a very long conversation about difficulty healing wounds without adequate calories and that I am very fearful that her continued weight loss is indicating advancing failure to thrive which we discussed ultimately could lead to death.  I would like her weight rechecked in 2 weeks.   Unfortunately I will not be in office but I have placed her on our lead physician, Dr. Warrick Parisian, schedule.  I will sign out to him.  If we continue to see a steep decline in her weight and function I would like to proceed with at least comfort care referral.     No orders of the defined types were placed in this encounter.  No orders of the defined types were placed in this encounter.    Stacey Norlander, DO [place] [phone]

## 2020-08-16 ENCOUNTER — Ambulatory Visit (INDEPENDENT_AMBULATORY_CARE_PROVIDER_SITE_OTHER): Payer: Medicare Other | Admitting: Family Medicine

## 2020-08-16 ENCOUNTER — Other Ambulatory Visit: Payer: Self-pay

## 2020-08-16 ENCOUNTER — Encounter: Payer: Self-pay | Admitting: Family Medicine

## 2020-08-16 VITALS — BP 140/69 | HR 59 | Ht 59.0 in | Wt 82.4 lb

## 2020-08-16 DIAGNOSIS — E441 Mild protein-calorie malnutrition: Secondary | ICD-10-CM

## 2020-08-16 DIAGNOSIS — R627 Adult failure to thrive: Secondary | ICD-10-CM

## 2020-08-16 NOTE — Progress Notes (Signed)
BP 140/69   Pulse (!) 59   Ht 4' 11" (1.499 m)   Wt 82 lb 6 oz (37.4 kg)   SpO2 98%   BMI 16.64 kg/m    Subjective:   Patient ID: Stacey Brown, female    DOB: [birth year], 87 y.o.   MRN: [id number]  HPI: Stacey Brown is a 87 y.o. female presenting on 08/16/2020 for Weight Loss   HPI Patient is coming in today with complaints of weight loss and failure to thrive.  She did see Dr. Lajuana Ripple couple months ago and try to add boost to see if that can help.  They wanted her to do twice a day but she has only been able to do once a day.  They deny her having any stomach issues or pains but just that she cannot hold a lot of food down at 1 time and she gets full really easily.  She does have tightened esophagus and cannot swallow meats and some things like that are a a little bit thicker.  She says she feels happy and feels well but that is concerned because she is continue to lose Weight.  Relevant past medical, surgical, family and social history reviewed and updated as indicated. Interim medical history since our last visit reviewed. Allergies and medications reviewed and updated.  Review of Systems  Constitutional:  Positive for appetite change and unexpected weight change. Negative for chills and fever.  Eyes:  Negative for visual disturbance.  Respiratory:  Negative for chest tightness and shortness of breath.   Cardiovascular:  Negative for chest pain and leg swelling.  Gastrointestinal:  Negative for abdominal pain, nausea and vomiting.  Musculoskeletal:  Negative for back pain and gait problem.  Skin:  Negative for rash.  Neurological:  Negative for light-headedness and headaches.  Psychiatric/Behavioral:  Negative for agitation and behavioral problems.   All other systems reviewed and are negative.  Per HPI unless specifically indicated above   Allergies as of 08/16/2020       Reactions   Dexilant [dexlansoprazole]    Throat swells    Ace Inhibitors Cough         Medication List        Accurate as of August 16, 2020  3:39 PM. If you have any questions, ask your nurse or doctor.          Accu-Chek Aviva Plus test strip Generic drug: glucose blood USE UP TO 3 TIMES A DAY AS DIRECTED.   amLODipine 2.5 MG tablet Commonly known as: NORVASC Take 1 tablet (2.5 mg total) by mouth daily.   apixaban 2.5 MG Tabs tablet Commonly known as: Eliquis TAKE (1) TABLET TWICE A DAY.   azelastine 0.1 % nasal spray Commonly known as: ASTELIN Place 2 sprays into both nostrils 2 (two) times daily. Use in each nostril as directed   blood glucose meter kit and supplies Dispense based on patient and insurance preference. Use up to four times daily as directed. (FOR ICD-9 250.00, 250.01).   Boost Very High Calorie Liqd Take 8 fluid ounces by mouth in the morning, at noon, and at bedtime.   carboxymethylcellulose 0.5 % Soln Commonly known as: REFRESH PLUS 1 drop 3 (three) times daily as needed.   Carex Coccyx Cushion Misc Use as directed to offset pressure from sacrum   cromolyn 4 % ophthalmic solution Commonly known as: OPTICROM   hydrochlorothiazide 12.5 MG capsule Commonly known as: MICROZIDE Take 1 capsule (12.5 mg total) by mouth  daily as needed (edema).   linaclotide 290 MCG Caps capsule Commonly known as: Linzess TAKE (1) CAPSULE DAILY   loratadine 10 MG tablet Commonly known as: CLARITIN TAKE 1 TABLET ONCE DAILY   mirtazapine 7.5 MG tablet Commonly known as: REMERON Take 1 tablet (7.5 mg total) by mouth at bedtime.   nebivolol 5 MG tablet Commonly known as: Bystolic Take 1 tablet (5 mg total) by mouth daily.   nystatin cream Commonly known as: MYCOSTATIN Apply 1 application topically 2 (two) times daily.   olmesartan 20 MG tablet Commonly known as: BENICAR Take 1 tablet (20 mg total) by mouth daily.   polyethylene glycol powder 17 GM/SCOOP powder Commonly known as: GLYCOLAX/MIRALAX Take 17 g by mouth daily.   silver  sulfADIAZINE 1 % cream Commonly known as: Silvadene Apply 1 application topically daily. x14 days   STARCH-MALTO DEXTRIN Powd Take by mouth.   travoprost (benzalkonium) 0.004 % ophthalmic solution Commonly known as: TRAVATAN Place 1 drop into both eyes at bedtime.         Objective:   BP 140/69   Pulse (!) 59   Ht 4' 11" (1.499 m)   Wt 82 lb 6 oz (37.4 kg)   SpO2 98%   BMI 16.64 kg/m   Wt Readings from Last 3 Encounters:  08/16/20 82 lb 6 oz (37.4 kg)  08/02/20 82 lb 9.6 oz (37.5 kg)  05/01/20 88 lb (39.9 kg)    Physical Exam Vitals and nursing note reviewed.  Constitutional:      General: She is not in acute distress.    Appearance: She is well-developed. She is not diaphoretic.  Eyes:     Conjunctiva/sclera: Conjunctivae normal.  Abdominal:     General: Abdomen is flat. Bowel sounds are normal. There is no distension.     Tenderness: There is no abdominal tenderness. There is no right CVA tenderness, left CVA tenderness, guarding or rebound.  Neurological:     Mental Status: She is alert and oriented to person, place, and time.     Coordination: Coordination normal.  Psychiatric:        Behavior: Behavior normal.      Assessment & Plan:   Problem List Items Addressed This Visit       Other   Mild protein-calorie malnutrition ([place]) - Primary   Failure to thrive in adult    Recommended to add butter to her dishes, including rice or pasta dishes or eggs to add additional butter and that she needs to take small very frequent meals and so that they can keep putting food in front of her throughout the day to help expand her stomach slowly.  Also recommended to continue doing the boost but only do it after the meal not before she eats so that she does not end up taking it instead of eating.  Or do it later in the evening when she does not normally take milk.  They said they are giving her sugar-free cookies right now and I said to go ahead and start giving her  normal cookies so she can get the calories and anything else that they have been given to her sugar-free to go ahead and give it to her normally so she has the calories. Follow up plan: Return in about 8 weeks (around 10/11/2020), or if symptoms worsen or fail to improve, for Weight loss and failure to thrive recheck with PCP.  Counseling provided for all of the vaccine components No orders of the defined  types were placed in this encounter.   Caryl Pina, MD [facility] 08/16/2020, 3:39 PM

## 2020-08-16 NOTE — Patient Instructions (Signed)
Recommended to add butter to her dishes, including rice or pasta dishes or eggs to add additional butter and that she needs to take small very frequent meals and so that they can keep putting food in front of her throughout the day to help expand her stomach slowly.  Also recommended to continue doing the boost but only do it after the meal not before she eats so that she does not end up taking it instead of eating.  Or do it later in the evening when she does not normally take milk.  They said they are giving her sugar-free cookies right now and I said to go ahead and start giving her normal cookies so she can get the calories and anything else that they have been given to her sugar-free to go ahead and give it to her normally so she has the calories.

## 2020-09-28 ENCOUNTER — Other Ambulatory Visit: Payer: Self-pay | Admitting: Family Medicine

## 2020-10-11 ENCOUNTER — Other Ambulatory Visit: Payer: Self-pay

## 2020-10-11 ENCOUNTER — Ambulatory Visit (INDEPENDENT_AMBULATORY_CARE_PROVIDER_SITE_OTHER): Payer: Medicare Other | Admitting: Family Medicine

## 2020-10-11 ENCOUNTER — Encounter: Payer: Self-pay | Admitting: Family Medicine

## 2020-10-11 VITALS — BP 165/77 | HR 74 | Temp 96.9°F | Ht 59.0 in | Wt 83.4 lb

## 2020-10-11 DIAGNOSIS — L89152 Pressure ulcer of sacral region, stage 2: Secondary | ICD-10-CM

## 2020-10-11 DIAGNOSIS — E440 Moderate protein-calorie malnutrition: Secondary | ICD-10-CM

## 2020-10-11 DIAGNOSIS — R739 Hyperglycemia, unspecified: Secondary | ICD-10-CM | POA: Diagnosis not present

## 2020-10-11 DIAGNOSIS — R54 Age-related physical debility: Secondary | ICD-10-CM

## 2020-10-11 DIAGNOSIS — R627 Adult failure to thrive: Secondary | ICD-10-CM | POA: Diagnosis not present

## 2020-10-11 DIAGNOSIS — E1159 Type 2 diabetes mellitus with other circulatory complications: Secondary | ICD-10-CM

## 2020-10-11 DIAGNOSIS — E11621 Type 2 diabetes mellitus with foot ulcer: Secondary | ICD-10-CM

## 2020-10-11 DIAGNOSIS — I152 Hypertension secondary to endocrine disorders: Secondary | ICD-10-CM | POA: Diagnosis not present

## 2020-10-11 DIAGNOSIS — L97521 Non-pressure chronic ulcer of other part of left foot limited to breakdown of skin: Secondary | ICD-10-CM

## 2020-10-11 DIAGNOSIS — R6889 Other general symptoms and signs: Secondary | ICD-10-CM | POA: Diagnosis not present

## 2020-10-11 LAB — BAYER DCA HB A1C WAIVED: HB A1C (BAYER DCA - WAIVED): 7 % — ABNORMAL HIGH (ref <7.0)

## 2020-10-11 MED ORDER — AMLODIPINE BESYLATE 2.5 MG PO TABS: 2.5000 mg | ORAL_TABLET | Freq: Every day | ORAL | 3 refills | Status: AC

## 2020-10-11 MED ORDER — SILVER SULFADIAZINE 1 % EX CREA
1.0000 | TOPICAL_CREAM | Freq: Every day | CUTANEOUS | 0 refills | Status: AC
Start: 2020-10-11 — End: ?

## 2020-10-11 NOTE — Progress Notes (Signed)
Subjective: CC: Failure to thrive PCP: Janora Norlander, DO Stacey Brown is a 87 y.o. female presenting to clinic today for:  1.  Failure to thrive, moderate malnutrition Patient was seen 2 months ago for the above.  She is brought to the office today by her daughter.  She notes that she continues to struggle with getting food into her and getting the supplemental shakes into her because she does not like them.  She has noticed skin breakdown on her sacrum again and that she also has a lesion on her left foot.  She tried to see the podiatrist but unfortunately he had COVID-19 and they have not yet been assessed.  It has a little bleeding today.  The patient has not complained of pain, fevers.   ROS: Per HPI  Allergies  Allergen Reactions   Dexilant [Dexlansoprazole]     Throat swells    Ace Inhibitors Cough   Past Medical History:  Diagnosis Date   Abnormal liver function test    Allergy    allergic  rhinitis   Arthritis    ASCVD (arteriosclerotic cardiovascular disease)    Atrial fibrillation (HCC)    Cataract    Chronic anticoagulation    Chronic kidney disease    Diabetes mellitus without complication (HCC)    Esophageal stricture    GERD (gastroesophageal reflux disease)    Glaucoma    H/O TB (tuberculosis)    History of DVT of lower extremity    Hyperlipidemia    Macular degeneration    Osteoporosis    Primary biliary cirrhosis (HCC)    Shingles    Swallowing difficulty     Current Outpatient Medications:    ACCU-CHEK AVIVA PLUS test strip, USE UP TO 3 TIMES A DAY AS DIRECTED., Disp: 100 each, Rfl: 2   apixaban (ELIQUIS) 2.5 MG TABS tablet, TAKE (1) TABLET TWICE A DAY., Disp: 180 tablet, Rfl: 3   azelastine (ASTELIN) 0.1 % nasal spray, Place 2 sprays into both nostrils 2 (two) times daily. Use in each nostril as directed, Disp: 30 mL, Rfl: 12   blood glucose meter kit and supplies, Dispense based on patient and insurance preference. Use up to four times  daily as directed. (FOR ICD-9 250.00, 250.01)., Disp: 1 each, Rfl: 0   carboxymethylcellulose (REFRESH PLUS) 0.5 % SOLN, 1 drop 3 (three) times daily as needed., Disp: , Rfl:    cromolyn (OPTICROM) 4 % ophthalmic solution, , Disp: , Rfl:    hydrochlorothiazide (MICROZIDE) 12.5 MG capsule, Take 1 capsule (12.5 mg total) by mouth daily as needed (edema)., Disp: 90 capsule, Rfl: 3   linaclotide (LINZESS) 290 MCG CAPS capsule, TAKE (1) CAPSULE DAILY, Disp: 90 capsule, Rfl: 3   loratadine (CLARITIN) 10 MG tablet, TAKE 1 TABLET ONCE DAILY, Disp: 30 tablet, Rfl: 5   mirtazapine (REMERON) 7.5 MG tablet, Take 1 tablet (7.5 mg total) by mouth at bedtime., Disp: 90 tablet, Rfl: 3   Misc. Devices (CAREX COCCYX CUSHION) MISC, Use as directed to offset pressure from sacrum, Disp: 1 each, Rfl: 0   nebivolol (BYSTOLIC) 5 MG tablet, Take 1 tablet (5 mg total) by mouth daily., Disp: 90 tablet, Rfl: 3   Nutritional Supplements (BOOST VERY HIGH CALORIE) LIQD, Take 8 fluid ounces by mouth in the morning, at noon, and at bedtime., Disp: 5192 mL, Rfl: PRN   nystatin cream (MYCOSTATIN), Apply 1 application topically 2 (two) times daily., Disp: 30 g, Rfl: 2   olmesartan (BENICAR) 20 MG  tablet, Take 1 tablet (20 mg total) by mouth daily., Disp: 90 tablet, Rfl: 3   polyethylene glycol powder (GLYCOLAX/MIRALAX) 17 GM/SCOOP powder, Take 17 g by mouth daily., Disp: 3350 g, Rfl: 1   silver sulfADIAZINE (SILVADENE) 1 % cream, Apply 1 application topically daily. x14 days, Disp: 50 g, Rfl: 0   STARCH-MALTO DEXTRIN POWD, Take by mouth., Disp: , Rfl:    travoprost, benzalkonium, (TRAVATAN) 0.004 % ophthalmic solution, Place 1 drop into both eyes at bedtime., Disp: , Rfl:    amLODipine (NORVASC) 2.5 MG tablet, Take 1 tablet (2.5 mg total) by mouth daily. (Patient not taking: Reported on 10/11/2020), Disp: 90 tablet, Rfl: 3 Social History   Socioeconomic History   Marital status: Widowed    Spouse name: Not on file   Number of  children: 1   Years of education: Not on file   Highest education level: Not on file  Occupational History   Occupation: Retired    Comment: Disabled  Tobacco Use   Smoking status: Never   Smokeless tobacco: Never  Vaping Use   Vaping Use: Never used  Substance and Sexual Activity   Alcohol use: No   Drug use: No   Sexual activity: Never  Other Topics Concern   Not on file  Social History Narrative   Daily caffeine Use   Social Determinants of Health   Financial Resource Strain: Not on file  Food Insecurity: Not on file  Transportation Needs: Not on file  Physical Activity: Not on file  Stress: Not on file  Social Connections: Not on file  Intimate Partner Violence: Not on file   Family History  Problem Relation Age of Onset   Breast cancer Other        Neice   Heart disease Mother    Diabetes Mother        Brother,   Heart disease Brother    Kidney disease Cousin    Cancer Father    Osteoporosis Sister    Hyperlipidemia Daughter    Diabetes Daughter    Diabetes Brother    Thyroid disease Brother    Cirrhosis Other        Nephew   Colon cancer Neg Hx     Objective: Office vital signs reviewed. BP (!) 165/77   Pulse 74   Temp (!) 96.9 F (36.1 C) (Temporal)   Ht '4\' 11"'  (1.499 m)   Wt 83 lb 6.4 oz (37.8 kg)   SpO2 99%   BMI 16.84 kg/m   Physical Examination:  General: Awake, alert, very frail, thin elderly female, No acute distress Cardio: regular rate and rhythm, S1S2 heard, no murmurs appreciated Pulm: clear to auscultation bilaterally, no wheezes, rhonchi or rales; normal work of breathing on room air Extremities: warm, well perfused, pedal edema, no cyanosis or clubbing; foot ulcer noted along the dorsal aspect of the left second digit at the DIP joint.  Limited skin breakdown.  No purulence.  Scant dried blood. MSK: Arrives in wheelchair.  Increased kyphosis.  Patient is very hunched over in her chair  Assessment/ Plan: 87 y.o. female    Failure to thrive in adult - Plan: CMP14+EGFR, Bayer DCA Hb A1c Waived, CBC, VITAMIN D 25 Hydroxy (Vit-D Deficiency, Fractures), Vitamin B12, Amb Referral to Palliative Care  Malnutrition of moderate degree (HCC) - Plan: CMP14+EGFR, Bayer DCA Hb A1c Waived, CBC, VITAMIN D 25 Hydroxy (Vit-D Deficiency, Fractures), Vitamin B12, Amb Referral to Palliative Care  Frailty syndrome in geriatric patient  Pressure injury of sacral region, stage 2 ([place]) - Plan: Amb Referral to Palliative Care, silver sulfADIAZINE (SILVADENE) 1 % cream  Diabetic ulcer of toe of left foot associated with type 2 diabetes mellitus, limited to breakdown of skin ([place]) - Plan: Amb Referral to Palliative Care, silver sulfADIAZINE (SILVADENE) 1 % cream  Hypertension associated with diabetes ([place]) - Plan: amLODipine (NORVASC) 2.5 MG tablet  1 pound weight gain since our last visit.  Check labs.  I am going to have palliative care reach out to the patient.  We discussed that this is not hospice services and that this is simply to help with the quality of life given her decline.  However if she wants a referral to hospice I will be glad to place.  Again reinforced need to incorporate high caloric food into the diet to promote weight gain.  Okay to continue high-protein yogurts.  I worry that her pressure injury to the sacrum and foot ulcer will not heal without adequate nutrition.  No evidence of infection on today's exam but we discussed signs and symptoms that would warrant further evaluation.  I renewed her silver Silvadene cream to use to the affected areas  Blood pressure was not at goal.  For some reason the amlodipine has become unavailable to the patient but I have renewed this at 2.5 mg.  Would like to reassess her again in the next 1 to 2 months for weight check, sooner if concerns arise  No orders of the defined types were placed in this encounter.  No orders of the defined types were placed in this  encounter.    Janora Norlander, DO [place] [phone]

## 2020-10-15 LAB — CMP14+EGFR
ALT: 12 IU/L (ref 0–32)
AST: 29 IU/L (ref 0–40)
Albumin/Globulin Ratio: 1.4 (ref 1.2–2.2)
Albumin: 4.2 g/dL (ref 3.5–4.6)
Alkaline Phosphatase: 95 IU/L (ref 44–121)
BUN/Creatinine Ratio: 32 — ABNORMAL HIGH (ref 12–28)
BUN: 40 mg/dL — ABNORMAL HIGH (ref 10–36)
Bilirubin Total: 0.4 mg/dL (ref 0.0–1.2)
CO2: 20 mmol/L (ref 20–29)
Calcium: 9.6 mg/dL (ref 8.7–10.3)
Chloride: 101 mmol/L (ref 96–106)
Creatinine, Ser: 1.26 mg/dL — ABNORMAL HIGH (ref 0.57–1.00)
Globulin, Total: 2.9 g/dL (ref 1.5–4.5)
Glucose: 133 mg/dL — ABNORMAL HIGH (ref 65–99)
Potassium: 4.5 mmol/L (ref 3.5–5.2)
Sodium: 140 mmol/L (ref 134–144)
Total Protein: 7.1 g/dL (ref 6.0–8.5)
eGFR: 39 mL/min/{1.73_m2} — ABNORMAL LOW (ref >59)

## 2020-10-15 LAB — VITAMIN D 25 HYDROXY (VIT D DEFICIENCY, FRACTURES): Vit D, 25-Hydroxy: 38.4 ng/mL (ref 30.0–100.0)

## 2020-10-15 LAB — CBC
Hematocrit: 37.7 % (ref 34.0–46.6)
Hemoglobin: 12.2 g/dL (ref 11.1–15.9)
MCH: 30 pg (ref 26.6–33.0)
MCHC: 32.4 g/dL (ref 31.5–35.7)
MCV: 93 fL (ref 79–97)
Platelets: 129 10*3/uL — ABNORMAL LOW (ref 150–450)
RBC: 4.06 x10E6/uL (ref 3.77–5.28)
RDW: 13.9 % (ref 11.7–15.4)
WBC: 5.1 10*3/uL (ref 3.4–10.8)

## 2020-10-15 LAB — VITAMIN B12: Vitamin B-12: 284 pg/mL (ref 232–1245)

## 2020-10-17 ENCOUNTER — Other Ambulatory Visit: Payer: Self-pay | Admitting: Family Medicine

## 2020-10-17 DIAGNOSIS — J309 Allergic rhinitis, unspecified: Secondary | ICD-10-CM

## 2020-11-06 ENCOUNTER — Ambulatory Visit (INDEPENDENT_AMBULATORY_CARE_PROVIDER_SITE_OTHER): Payer: Medicare Other | Admitting: Nurse Practitioner

## 2020-11-06 ENCOUNTER — Encounter: Payer: Self-pay | Admitting: Nurse Practitioner

## 2020-11-06 DIAGNOSIS — Z20822 Contact with and (suspected) exposure to covid-19: Secondary | ICD-10-CM | POA: Diagnosis not present

## 2020-11-06 NOTE — Progress Notes (Addendum)
   Virtual Visit  Note Due to COVID-19 pandemic this visit was conducted virtually. This visit type was conducted due to national recommendations for restrictions regarding the COVID-19 Pandemic (e.g. social distancing, sheltering in place) in an effort to limit this patient's exposure and mitigate transmission in our community. All issues noted in this document were discussed and addressed.  A physical exam was not performed with this format.  I connected with Stacey Brown on 11/08/20 at 11:00 am by telephone and verified that I am speaking with the correct person using two identifiers. Stacey Brown is currently located at home during visit. The provider, Ivy Lynn, NP is located in their office at time of visit.  I discussed the limitations, risks, security and privacy concerns of performing an evaluation and management service by telephone and the availability of in person appointments. I also discussed with the patient that there may be a patient responsible charge related to this service. The patient expressed understanding and agreed to proceed.   History and Present Illness:  HPI Patient exposed to COVID-19 by a close family member who is positive for COVID.  No current symptoms.  Patient wants to make sure she is treated early for covid infection due to age and potential complications if any.   Review of Systems  HENT: Negative.  Negative for sinus pain.   Respiratory:  Negative for cough and shortness of breath.   Cardiovascular: Negative.   Skin: Negative.   All other systems reviewed and are negative.   Observations/Objective: Televisit patient not in distress.  Assessment and Plan: Take meds as prescribed - Use a cool mist humidifier  -Use saline nose sprays frequently -Force fluids -For fever or aches or pains- take Tylenol or ibuprofen. -COVID-19 swab ordered results pending.  Follow Up Instructions: Follow-up with worsening unresolved symptoms.    I  discussed the assessment and treatment plan with the patient. The patient was provided an opportunity to ask questions and all were answered. The patient agreed with the plan and demonstrated an understanding of the instructions.   The patient was advised to call back or seek an in-person evaluation if the symptoms worsen or if the condition fails to improve as anticipated.  The above assessment and management plan was discussed with the patient. The patient verbalized understanding of and has agreed to the management plan. Patient is aware to call the clinic if symptoms persist or worsen. Patient is aware when to return to the clinic for a follow-up visit. Patient educated on when it is appropriate to go to the emergency department.   Time call ended: 11:09 AM  I provided 9 minutes of  non face-to-face time during this encounter.    Ivy Lynn, NP

## 2020-11-06 NOTE — Assessment & Plan Note (Signed)
Take meds as prescribed - Use a cool mist humidifier  -Use saline nose sprays frequently -Force fluids -For fever or aches or pains- take Tylenol or ibuprofen. -COVID-19 swab ordered results pending.

## 2020-11-07 ENCOUNTER — Other Ambulatory Visit: Payer: Self-pay | Admitting: Family Medicine

## 2020-11-07 ENCOUNTER — Telehealth: Payer: Self-pay | Admitting: Family Medicine

## 2020-11-07 LAB — SARS-COV-2, NAA 2 DAY TAT

## 2020-11-07 LAB — NOVEL CORONAVIRUS, NAA: SARS-CoV-2, NAA: DETECTED — AB

## 2020-11-07 MED ORDER — MOLNUPIRAVIR EUA 200MG CAPSULE: 4.0000 | ORAL_CAPSULE | Freq: Two times a day (BID) | ORAL | 0 refills | Status: AC

## 2020-11-07 NOTE — Telephone Encounter (Signed)
Pt's daughter calling about results of covid test. Please review and call back.

## 2020-11-07 NOTE — Telephone Encounter (Signed)
Please let the patient know that I sent their prescription to their pharmacy. Thanks, WS 

## 2020-11-07 NOTE — Telephone Encounter (Signed)
Left message to call back  

## 2020-11-07 NOTE — Telephone Encounter (Signed)
Please review, patient's Covid is positive and she is 89.

## 2020-11-08 ENCOUNTER — Other Ambulatory Visit: Payer: Self-pay | Admitting: Nurse Practitioner

## 2020-11-12 DIAGNOSIS — R0902 Hypoxemia: Secondary | ICD-10-CM | POA: Diagnosis not present

## 2020-11-12 DIAGNOSIS — U071 COVID-19: Secondary | ICD-10-CM | POA: Diagnosis not present

## 2020-11-12 DIAGNOSIS — R1084 Generalized abdominal pain: Secondary | ICD-10-CM | POA: Diagnosis not present

## 2020-11-12 DIAGNOSIS — R531 Weakness: Secondary | ICD-10-CM | POA: Diagnosis not present

## 2020-11-15 NOTE — Telephone Encounter (Signed)
Spoke with daughter, she reports mom is doing some better since being diagnosed with Covid last week.

## 2020-11-18 ENCOUNTER — Other Ambulatory Visit: Payer: Self-pay | Admitting: Family Medicine

## 2020-11-18 DIAGNOSIS — I152 Hypertension secondary to endocrine disorders: Secondary | ICD-10-CM

## 2020-11-18 DIAGNOSIS — E1159 Type 2 diabetes mellitus with other circulatory complications: Secondary | ICD-10-CM

## 2020-11-21 DIAGNOSIS — E440 Moderate protein-calorie malnutrition: Secondary | ICD-10-CM | POA: Diagnosis not present

## 2020-11-21 DIAGNOSIS — R627 Adult failure to thrive: Secondary | ICD-10-CM | POA: Diagnosis not present

## 2020-11-21 DIAGNOSIS — E11621 Type 2 diabetes mellitus with foot ulcer: Secondary | ICD-10-CM | POA: Diagnosis not present

## 2020-11-21 DIAGNOSIS — L89152 Pressure ulcer of sacral region, stage 2: Secondary | ICD-10-CM | POA: Diagnosis not present

## 2020-12-03 ENCOUNTER — Telehealth: Payer: Self-pay | Admitting: Family Medicine

## 2020-12-03 NOTE — Telephone Encounter (Signed)
LVM for pt to rtn my call to schedule AWV with [facility].  

## 2020-12-10 DIAGNOSIS — R58 Hemorrhage, not elsewhere classified: Secondary | ICD-10-CM | POA: Diagnosis not present

## 2020-12-16 ENCOUNTER — Other Ambulatory Visit: Payer: Self-pay | Admitting: Family Medicine

## 2020-12-18 DIAGNOSIS — L89159 Pressure ulcer of sacral region, unspecified stage: Secondary | ICD-10-CM | POA: Diagnosis not present

## 2020-12-18 DIAGNOSIS — K8689 Other specified diseases of pancreas: Secondary | ICD-10-CM | POA: Diagnosis not present

## 2020-12-18 DIAGNOSIS — R52 Pain, unspecified: Secondary | ICD-10-CM | POA: Diagnosis not present

## 2020-12-18 DIAGNOSIS — S3282XA Multiple fractures of pelvis without disruption of pelvic ring, initial encounter for closed fracture: Secondary | ICD-10-CM | POA: Diagnosis not present

## 2020-12-18 DIAGNOSIS — Z743 Need for continuous supervision: Secondary | ICD-10-CM | POA: Diagnosis not present

## 2020-12-18 DIAGNOSIS — K5730 Diverticulosis of large intestine without perforation or abscess without bleeding: Secondary | ICD-10-CM | POA: Diagnosis not present

## 2020-12-18 DIAGNOSIS — K838 Other specified diseases of biliary tract: Secondary | ICD-10-CM | POA: Diagnosis not present

## 2020-12-19 DIAGNOSIS — R404 Transient alteration of awareness: Secondary | ICD-10-CM | POA: Diagnosis not present

## 2020-12-19 DIAGNOSIS — R279 Unspecified lack of coordination: Secondary | ICD-10-CM | POA: Diagnosis not present

## 2020-12-19 DIAGNOSIS — K8689 Other specified diseases of pancreas: Secondary | ICD-10-CM | POA: Diagnosis not present

## 2020-12-19 DIAGNOSIS — Z743 Need for continuous supervision: Secondary | ICD-10-CM | POA: Diagnosis not present

## 2020-12-19 DIAGNOSIS — K5730 Diverticulosis of large intestine without perforation or abscess without bleeding: Secondary | ICD-10-CM | POA: Diagnosis not present

## 2020-12-19 DIAGNOSIS — Z7401 Bed confinement status: Secondary | ICD-10-CM | POA: Diagnosis not present

## 2020-12-19 DIAGNOSIS — K838 Other specified diseases of biliary tract: Secondary | ICD-10-CM | POA: Diagnosis not present

## 2020-12-19 DIAGNOSIS — S3282XA Multiple fractures of pelvis without disruption of pelvic ring, initial encounter for closed fracture: Secondary | ICD-10-CM | POA: Diagnosis not present

## 2020-12-19 DIAGNOSIS — R0902 Hypoxemia: Secondary | ICD-10-CM | POA: Diagnosis not present

## 2020-12-27 ENCOUNTER — Ambulatory Visit (INDEPENDENT_AMBULATORY_CARE_PROVIDER_SITE_OTHER): Payer: Medicare Other | Admitting: Family Medicine

## 2020-12-27 DIAGNOSIS — L89154 Pressure ulcer of sacral region, stage 4: Secondary | ICD-10-CM | POA: Diagnosis not present

## 2020-12-27 NOTE — Progress Notes (Signed)
Telephone visit  Subjective: Stacey Brown PCP: Janora Norlander, DO Stacey Brown is a 87 y.o. female calls for telephone consult today. Patient provides verbal consent for consult held via phone.  Due to COVID-19 pandemic this visit was conducted virtually. This visit type was conducted due to national recommendations for restrictions regarding the COVID-19 Pandemic (e.g. social distancing, sheltering in place) in an effort to limit this patient's exposure and mitigate transmission in our community. All issues noted in this document were discussed and addressed.  A physical exam was not performed with this format.   Location of patient: home Location of provider: [facility] Others present for call: daughter  1. ulcer Patient noted to have a sacral decubitus ulcer.  She had advanced imaging performed which did show soft tissue irregularity consistent with the ulcer but no appreciable osteomyelitis or other bony compromise.  She was treated with doxycycline and her daughter notes that she still has a couple of days of this left.  She does report that her mother intermittently sees a young boy and talks about stairs in the home, neither which are present.  She has not reported any fevers.  She reports no drainage from the wound but notes that it is still open.  Hospice care is coming in intermittently to dress her wound and pack it.  ROS: Per HPI  Allergies  Allergen Reactions   Dexilant [Dexlansoprazole]     Throat swells    Ace Inhibitors Cough   Past Medical History:  Diagnosis Date   Abnormal liver function test    Allergy    allergic  rhinitis   Arthritis    ASCVD (arteriosclerotic cardiovascular disease)    Atrial fibrillation (HCC)    Cataract    Chronic anticoagulation    Chronic kidney disease    Diabetes mellitus without complication (HCC)    Esophageal stricture    GERD (gastroesophageal reflux disease)    Glaucoma    H/O TB (tuberculosis)    History of DVT of lower  extremity    Hyperlipidemia    Macular degeneration    Osteoporosis    Primary biliary cirrhosis (HCC)    Shingles    Swallowing difficulty     Current Outpatient Medications:    ACCU-CHEK AVIVA PLUS test strip, USE UP TO 3 TIMES A DAY AS DIRECTED., Disp: 100 each, Rfl: 2   amLODipine (NORVASC) 2.5 MG tablet, Take 1 tablet (2.5 mg total) by mouth daily., Disp: 90 tablet, Rfl: 3   apixaban (ELIQUIS) 2.5 MG TABS tablet, TAKE (1) TABLET TWICE A DAY., Disp: 180 tablet, Rfl: 3   azelastine (ASTELIN) 0.1 % nasal spray, Place 2 sprays into both nostrils 2 (two) times daily. Use in each nostril as directed, Disp: 30 mL, Rfl: 12   blood glucose meter kit and supplies, Dispense based on patient and insurance preference. Use up to four times daily as directed. (FOR ICD-9 250.00, 250.01)., Disp: 1 each, Rfl: 0   carboxymethylcellulose (REFRESH PLUS) 0.5 % SOLN, 1 drop 3 (three) times daily as needed., Disp: , Rfl:    cromolyn (OPTICROM) 4 % ophthalmic solution, , Disp: , Rfl:    hydrochlorothiazide (MICROZIDE) 12.5 MG capsule, Take 1 capsule (12.5 mg total) by mouth daily as needed (edema)., Disp: 90 capsule, Rfl: 3   LINZESS 290 MCG CAPS capsule, TAKE (1) CAPSULE DAILY, Disp: 90 capsule, Rfl: 0   loratadine (CLARITIN) 10 MG tablet, TAKE 1 TABLET ONCE DAILY, Disp: 30 tablet, Rfl: 5   mirtazapine (REMERON)  7.5 MG tablet, Take 1 tablet (7.5 mg total) by mouth at bedtime., Disp: 90 tablet, Rfl: 3   Misc. Devices (CAREX COCCYX CUSHION) MISC, Use as directed to offset pressure from sacrum, Disp: 1 each, Rfl: 0   nebivolol (BYSTOLIC) 5 MG tablet, TAKE 1 TABLET DAILY, Disp: 90 tablet, Rfl: 0   Nutritional Supplements (BOOST VERY HIGH CALORIE) LIQD, Take 8 fluid ounces by mouth in the morning, at noon, and at bedtime., Disp: 5192 mL, Rfl: PRN   nystatin cream (MYCOSTATIN), Apply 1 application topically 2 (two) times daily., Disp: 30 g, Rfl: 2   olmesartan (BENICAR) 20 MG tablet, Take 1 tablet (20 mg total) by  mouth daily., Disp: 90 tablet, Rfl: 3   polyethylene glycol powder (GLYCOLAX/MIRALAX) 17 GM/SCOOP powder, Take 17 g by mouth daily., Disp: 3350 g, Rfl: 1   silver sulfADIAZINE (SILVADENE) 1 % cream, Apply 1 application topically daily. x14 days, Disp: 50 g, Rfl: 0   STARCH-MALTO DEXTRIN POWD, Take by mouth., Disp: , Rfl:    travoprost, benzalkonium, (TRAVATAN) 0.004 % ophthalmic solution, Place 1 drop into both eyes at bedtime., Disp: , Rfl:    Assessment/ Plan: 87 y.o. female   Pressure injury of sacral region, stage 4 ([name])  She is under the care of hospice care who is dressing her wound.  Next visit will be on Monday.  Her daughter notes today that the wound does seem to be getting better but it is not closed up yet. She sounds like she has been having some delirium that manifests as hallucinations.  I suspect that this is a combination of acute injury to the sacrum and ongoing manifestations of failure to thrive.  I asked her daughter to contact me should any concerns arise.  Appreciate the help that is being provided by hospice care.  I would be glad to complete FMLA paperwork for her daughter.  She will drop this off  Start time: 2:34pm End time: 2:42pm  Total time spent on patient care (including telephone call/ virtual visit): 8 minutes  Stacey Brown, [place] [phone]

## 2020-12-30 DIAGNOSIS — Z029 Encounter for administrative examinations, unspecified: Secondary | ICD-10-CM

## 2021-01-20 ENCOUNTER — Other Ambulatory Visit: Payer: Self-pay | Admitting: Family Medicine

## 2021-01-20 DIAGNOSIS — R630 Anorexia: Secondary | ICD-10-CM

## 2021-02-21 DIAGNOSIS — R0902 Hypoxemia: Secondary | ICD-10-CM | POA: Diagnosis not present

## 2021-02-21 DIAGNOSIS — R0689 Other abnormalities of breathing: Secondary | ICD-10-CM | POA: Diagnosis not present

## 2021-02-21 DIAGNOSIS — R000 Tachycardia, unspecified: Secondary | ICD-10-CM | POA: Diagnosis not present

## 2021-02-21 DIAGNOSIS — R0989 Other specified symptoms and signs involving the circulatory and respiratory systems: Secondary | ICD-10-CM | POA: Diagnosis not present

## 2021-02-21 DIAGNOSIS — R404 Transient alteration of awareness: Secondary | ICD-10-CM | POA: Diagnosis not present

## 2021-02-24 ENCOUNTER — Telehealth: Payer: Self-pay | Admitting: Family Medicine

## 2021-03-12 DEATH — deceased
